# Patient Record
Sex: Male | Born: 1935 | Race: White | Hispanic: No | Marital: Married | Smoking: Former smoker
Health system: Southern US, Community
[De-identification: ages and names within clinical notes are randomized; demographics above are authoritative.]

## PROBLEM LIST (undated history)

## (undated) DIAGNOSIS — N289 Disorder of kidney and ureter, unspecified: Secondary | ICD-10-CM

## (undated) DIAGNOSIS — M159 Polyosteoarthritis, unspecified: Secondary | ICD-10-CM

## (undated) DIAGNOSIS — K5730 Diverticulosis of large intestine without perforation or abscess without bleeding: Secondary | ICD-10-CM

## (undated) DIAGNOSIS — I219 Acute myocardial infarction, unspecified: Secondary | ICD-10-CM

## (undated) DIAGNOSIS — G4733 Obstructive sleep apnea (adult) (pediatric): Secondary | ICD-10-CM

## (undated) DIAGNOSIS — Z85828 Personal history of other malignant neoplasm of skin: Secondary | ICD-10-CM

## (undated) DIAGNOSIS — E785 Hyperlipidemia, unspecified: Secondary | ICD-10-CM

## (undated) DIAGNOSIS — Z8739 Personal history of other diseases of the musculoskeletal system and connective tissue: Secondary | ICD-10-CM

## (undated) DIAGNOSIS — Z9989 Dependence on other enabling machines and devices: Secondary | ICD-10-CM

## (undated) DIAGNOSIS — T8859XA Other complications of anesthesia, initial encounter: Secondary | ICD-10-CM

## (undated) DIAGNOSIS — E1140 Type 2 diabetes mellitus with diabetic neuropathy, unspecified: Secondary | ICD-10-CM

## (undated) DIAGNOSIS — M4626 Osteomyelitis of vertebra, lumbar region: Secondary | ICD-10-CM

## (undated) DIAGNOSIS — Z9889 Other specified postprocedural states: Secondary | ICD-10-CM

## (undated) DIAGNOSIS — I10 Essential (primary) hypertension: Secondary | ICD-10-CM

## (undated) DIAGNOSIS — I2510 Atherosclerotic heart disease of native coronary artery without angina pectoris: Secondary | ICD-10-CM

## (undated) DIAGNOSIS — I340 Nonrheumatic mitral (valve) insufficiency: Secondary | ICD-10-CM

## (undated) DIAGNOSIS — R269 Unspecified abnormalities of gait and mobility: Secondary | ICD-10-CM

## (undated) DIAGNOSIS — I878 Other specified disorders of veins: Secondary | ICD-10-CM

## (undated) DIAGNOSIS — M549 Dorsalgia, unspecified: Secondary | ICD-10-CM

## (undated) DIAGNOSIS — T4145XA Adverse effect of unspecified anesthetic, initial encounter: Secondary | ICD-10-CM

## (undated) DIAGNOSIS — C801 Malignant (primary) neoplasm, unspecified: Secondary | ICD-10-CM

## (undated) DIAGNOSIS — E559 Vitamin D deficiency, unspecified: Secondary | ICD-10-CM

## (undated) DIAGNOSIS — J189 Pneumonia, unspecified organism: Secondary | ICD-10-CM

## (undated) DIAGNOSIS — I5030 Unspecified diastolic (congestive) heart failure: Secondary | ICD-10-CM

## (undated) DIAGNOSIS — Z6841 Body Mass Index (BMI) 40.0 and over, adult: Secondary | ICD-10-CM

## (undated) DIAGNOSIS — G8929 Other chronic pain: Secondary | ICD-10-CM

## (undated) DIAGNOSIS — Z955 Presence of coronary angioplasty implant and graft: Secondary | ICD-10-CM

## (undated) DIAGNOSIS — I4891 Unspecified atrial fibrillation: Secondary | ICD-10-CM

## (undated) HISTORY — DX: Polyosteoarthritis, unspecified: M15.9

## (undated) HISTORY — DX: Atherosclerotic heart disease of native coronary artery without angina pectoris: I25.10

## (undated) HISTORY — DX: Obstructive sleep apnea (adult) (pediatric): G47.33

## (undated) HISTORY — DX: Dependence on other enabling machines and devices: Z99.89

## (undated) HISTORY — DX: Other chronic pain: G89.29

## (undated) HISTORY — DX: Diverticulosis of large intestine without perforation or abscess without bleeding: K57.30

## (undated) HISTORY — DX: Nonrheumatic mitral (valve) insufficiency: I34.0

## (undated) HISTORY — DX: Vitamin D deficiency, unspecified: E55.9

## (undated) HISTORY — DX: Unspecified abnormalities of gait and mobility: R26.9

## (undated) HISTORY — DX: Disorder of kidney and ureter, unspecified: N28.9

## (undated) HISTORY — DX: Dorsalgia, unspecified: M54.9

## (undated) HISTORY — DX: Hyperlipidemia, unspecified: E78.5

## (undated) HISTORY — DX: Presence of coronary angioplasty implant and graft: Z95.5

## (undated) HISTORY — DX: Morbid (severe) obesity due to excess calories: E66.01

## (undated) HISTORY — PX: LAMINECTOMY: SHX219

## (undated) HISTORY — DX: Personal history of other malignant neoplasm of skin: Z85.828

## (undated) HISTORY — DX: Body Mass Index (BMI) 40.0 and over, adult: Z684

## (undated) HISTORY — DX: Type 2 diabetes mellitus with diabetic neuropathy, unspecified: E11.40

## (undated) HISTORY — DX: Other specified disorders of veins: I87.8

## (undated) HISTORY — PX: BACK SURGERY: SHX140

## (undated) HISTORY — DX: Personal history of other diseases of the musculoskeletal system and connective tissue: Z87.39

## (undated) HISTORY — DX: Unspecified diastolic (congestive) heart failure: I50.30

## (undated) HISTORY — DX: Other specified postprocedural states: Z98.890

---

## 1995-08-22 HISTORY — PX: ANGIOPLASTY: SHX39

## 1995-09-06 DIAGNOSIS — I219 Acute myocardial infarction, unspecified: Secondary | ICD-10-CM

## 1995-09-06 HISTORY — DX: Acute myocardial infarction, unspecified: I21.9

## 1995-09-09 DIAGNOSIS — Z955 Presence of coronary angioplasty implant and graft: Secondary | ICD-10-CM

## 1995-09-09 HISTORY — PX: CORONARY STENT PLACEMENT: SHX1402

## 1995-09-09 HISTORY — DX: Presence of coronary angioplasty implant and graft: Z95.5

## 2008-11-21 HISTORY — PX: OTHER SURGICAL HISTORY: SHX169

## 2010-10-21 HISTORY — PX: OTHER SURGICAL HISTORY: SHX169

## 2011-07-06 ENCOUNTER — Emergency Department (HOSPITAL_COMMUNITY): Payer: Medicare Other

## 2011-07-06 ENCOUNTER — Other Ambulatory Visit: Payer: Self-pay

## 2011-07-06 ENCOUNTER — Emergency Department (HOSPITAL_COMMUNITY)
Admission: EM | Admit: 2011-07-06 | Discharge: 2011-07-06 | Disposition: A | Discharge status: Home / Self Care | Payer: Medicare Other | Attending: Emergency Medicine | Admitting: Emergency Medicine

## 2011-07-06 ENCOUNTER — Encounter: Payer: Self-pay | Admitting: *Deleted

## 2011-07-06 DIAGNOSIS — W19XXXA Unspecified fall, initial encounter: Secondary | ICD-10-CM | POA: Insufficient documentation

## 2011-07-06 DIAGNOSIS — Z9861 Coronary angioplasty status: Secondary | ICD-10-CM | POA: Insufficient documentation

## 2011-07-06 DIAGNOSIS — I10 Essential (primary) hypertension: Secondary | ICD-10-CM | POA: Insufficient documentation

## 2011-07-06 DIAGNOSIS — Z87891 Personal history of nicotine dependence: Secondary | ICD-10-CM | POA: Insufficient documentation

## 2011-07-06 DIAGNOSIS — S42402A Unspecified fracture of lower end of left humerus, initial encounter for closed fracture: Secondary | ICD-10-CM

## 2011-07-06 DIAGNOSIS — I252 Old myocardial infarction: Secondary | ICD-10-CM | POA: Insufficient documentation

## 2011-07-06 DIAGNOSIS — I4891 Unspecified atrial fibrillation: Secondary | ICD-10-CM | POA: Insufficient documentation

## 2011-07-06 DIAGNOSIS — E119 Type 2 diabetes mellitus without complications: Secondary | ICD-10-CM | POA: Insufficient documentation

## 2011-07-06 DIAGNOSIS — S42413A Displaced simple supracondylar fracture without intercondylar fracture of unspecified humerus, initial encounter for closed fracture: Secondary | ICD-10-CM | POA: Insufficient documentation

## 2011-07-06 HISTORY — DX: Osteomyelitis of vertebra, lumbar region: M46.26

## 2011-07-06 HISTORY — DX: Unspecified atrial fibrillation: I48.91

## 2011-07-06 HISTORY — DX: Essential (primary) hypertension: I10

## 2011-07-06 HISTORY — DX: Acute myocardial infarction, unspecified: I21.9

## 2011-07-06 MED ORDER — HYDROCODONE-ACETAMINOPHEN 5-325 MG PO TABS: 1.0000 | ORAL_TABLET | ORAL | Status: AC | PRN

## 2011-07-06 MED ORDER — HYDROMORPHONE HCL 1 MG/ML IJ SOLN
1.0000 mg | Freq: Once | INTRAMUSCULAR | Status: AC
  Administered 2011-07-06: 1 mg via INTRAMUSCULAR
  Filled 2011-07-06: qty 1

## 2011-07-06 NOTE — ED Notes (Signed)
Pt c/o pain to left elbow. Pts family states pt fell this past Wednesday and could not get out of the floor. Pt did not want to go to the hospital at that time. Pt was seen by his pcp the next day and x-rayed on his left elbow. Pt was sent home with tylenol and ice. Pt began to hurt worse today and would not move his elbow.

## 2011-07-06 NOTE — ED Notes (Signed)
MD at bedside. 

## 2011-07-06 NOTE — ED Provider Notes (Signed)
History     CSN: [id number] Arrival date & time: 07/06/2011  6:21 PM Scribed for [name], MD, the patient was seen in room APA04/APA04. This chart was scribed by Katha Cabal. This patient's care was started at 7:03PM.    Chief Complaint  Patient presents with  . Elbow Pain     HPI Charles Chambers is a 75 y.o. male who presents to the Emergency Department complaining of gradual worsening of persistent left elbow pain onset 3 days ago with associated SOB.  Patient states that he landed on his hands and not his elbow.   Patient was seen by PCP the next day and the XR did not indicate a fx.  Patient was told to ice elbow and to take Tylenol and patient complied.  History is supplemented by the patient's daughter.    PAST MEDICAL HISTORY:  Past Medical History  Diagnosis Date  . Diabetes mellitus   . Hypertension   . Atrial fibrillation   . MI (myocardial infarction)   . Osteomyelitis of vertebra of lumbar region     PAST SURGICAL HISTORY:  Past Surgical History  Procedure Date  . Coronary stent placement   . Angioplasty   . Back surgery     MEDICATIONS:  Previous Medications   No medications on file     ALLERGIES:  Allergies as of 07/06/2011 - Review Complete 07/06/2011  Allergen Reaction Noted  . Aleve (all day pain)  07/06/2011  . Baclofen Other (See Comments) 07/06/2011  . Neurontin (gabapentin) Other (See Comments) 07/06/2011  . Other Other (See Comments) 07/06/2011     FAMILY HISTORY:  History reviewed. No pertinent family history.   SOCIAL HISTORY: History   Social History  . Marital Status: Married    Spouse Name: N/A    Number of Children: N/A  . Years of Education: N/A   Social History Main Topics  . Smoking status: Former Games developer  . Smokeless tobacco: None  . Alcohol Use: No  . Drug Use: No  . Sexually Active:    Other Topics Concern  . None   Social History Narrative  . None      Review of Systems 10 Systems reviewed and are  negative for acute change except as noted in the HPI.   Physical Exam    BP 132/105  Pulse 77  Resp 40  Ht 5\' 9"  (1.753 m)  Wt 307 lb (139.254 kg)  BMI 45.34 kg/m2  SpO2 95%  Physical Exam  Nursing note and vitals reviewed. Constitutional: He is oriented to person, place, and time. He appears well-developed. No distress.       Appearance consistent with age of record  HENT:  Head: Normocephalic and atraumatic.  Right Ear: External ear normal.  Left Ear: External ear normal.  Nose: Nose normal.  Mouth/Throat: Oropharynx is clear and moist.  Eyes: Conjunctivae are normal.  Neck: Neck supple.  Pulmonary/Chest: He has no rhonchi.  Abdominal: Soft. There is no tenderness.  Musculoskeletal: Normal range of motion.       Tendentious diffuse tenderness, pain with flexion and extension of left elbow.    Neurological: He is alert and oriented to person, place, and time. No sensory deficit.  Skin: No rash noted.       Color normal  Psychiatric: He has a normal mood and affect. His behavior is normal.    ED Course  Procedures  OTHER DATA REVIEWED: Nursing notes, vital signs, and past medical records reviewed.   DIAGNOSTIC  STUDIES: Oxygen Saturation is 95% on room air, normal by my interpretation.     LABS / RADIOLOGY:  No results found for this or any previous visit.    Dg Elbow Complete Left  07/06/2011  *RADIOLOGY REPORT*  Clinical Data: 75 year old male status post fall with pain.  LEFT ELBOW - COMPLETE 3+ VIEW  Comparison: None.  Findings: Oblique fracture through the left supracondylar humerus. Associated hemarthrosis.  Distal fragment demonstrates slight anterior displacement and angulation.  There is slight ulnar displacement.  Radial head appears remain intact.  Joint spaces preserved.  IMPRESSION: Oblique left supracondylar humerus fracture with hemarthrosis, mild anterior and ulnar displacement.  Original Report Authenticated By: Harley Hallmark, M.D.      ED  COURSE / COORDINATION OF CARE: 7:06 PM  CT Left Elbow without contrast of left elbow to look for fx. Will order EKG.    Orders Placed This Encounter  Procedures  . DG Elbow Complete Left  . ED EKG    MDM: Plain films show relatively nondisplaced left supracondylar humeral fracture. I discussed x-rays with family and hand surgeon in [place]. Surgeon will followup on Tuesday. Posterior splint, pain medicine     MEDICATIONS GIVEN IN THE E.D. Scheduled Meds:    .  HYDROmorphone (DILAUDID) injection  1 mg Intramuscular Once   Continuous Infusions:     DISCHARGE MEDICATIONS: New Prescriptions   No medications on file   I personally performed the services described in this documentation, which was scribed in my presence. The recorded information has been reviewed and considered. [name], MD       [name], MD 07/06/11 [phone]

## 2011-07-06 NOTE — ED Notes (Addendum)
Report received from Harvel Ricks, RN; pt transported to [facility]

## 2014-10-21 HISTORY — PX: OTHER SURGICAL HISTORY: SHX169

## 2016-06-21 HISTORY — PX: PAROTIDECTOMY: SUR1003

## 2016-07-11 DIAGNOSIS — C801 Malignant (primary) neoplasm, unspecified: Secondary | ICD-10-CM

## 2016-07-11 HISTORY — DX: Malignant (primary) neoplasm, unspecified: C80.1

## 2017-09-19 ENCOUNTER — Encounter: Payer: Self-pay | Admitting: Internal Medicine

## 2017-09-29 ENCOUNTER — Institutional Professional Consult (permissible substitution): Payer: Medicare Other | Admitting: Internal Medicine

## 2017-10-03 ENCOUNTER — Encounter: Payer: Self-pay | Admitting: Internal Medicine

## 2017-10-03 ENCOUNTER — Ambulatory Visit (INDEPENDENT_AMBULATORY_CARE_PROVIDER_SITE_OTHER): Payer: Medicare Other | Admitting: Internal Medicine

## 2017-10-03 VITALS — BP 102/74 | HR 88 | Ht 69.0 in | Wt 280.0 lb

## 2017-10-03 DIAGNOSIS — I2589 Other forms of chronic ischemic heart disease: Secondary | ICD-10-CM

## 2017-10-03 DIAGNOSIS — I255 Ischemic cardiomyopathy: Secondary | ICD-10-CM | POA: Diagnosis not present

## 2017-10-03 DIAGNOSIS — I130 Hypertensive heart and chronic kidney disease with heart failure and stage 1 through stage 4 chronic kidney disease, or unspecified chronic kidney disease: Secondary | ICD-10-CM

## 2017-10-03 DIAGNOSIS — I482 Chronic atrial fibrillation: Secondary | ICD-10-CM | POA: Diagnosis not present

## 2017-10-03 DIAGNOSIS — G4733 Obstructive sleep apnea (adult) (pediatric): Secondary | ICD-10-CM

## 2017-10-03 DIAGNOSIS — I4821 Permanent atrial fibrillation: Secondary | ICD-10-CM

## 2017-10-03 NOTE — Patient Instructions (Addendum)
Medication Instructions:  Your physician recommends that you continue on your current medications as directed. Please refer to the Current Medication list given to you today.   Labwork: None ordered   Testing/Procedures: None ordered   Follow-Up: Your physician wants you to follow-up in: 3 months with Dr. Rayann Heman in [place]   Any Other Special Instructions Will Be Listed Below (If Applicable).  Weigh yourself daily  Low-Sodium Eating Plan Sodium, which is an element that makes up salt, helps you maintain a healthy balance of fluids in your body. Too much sodium can increase your blood pressure and cause fluid and waste to be held in your body. Your health care provider or dietitian may recommend following this plan if you have high blood pressure (hypertension), kidney disease, liver disease, or heart failure. Eating less sodium can help lower your blood pressure, reduce swelling, and protect your heart, liver, and kidneys. What are tips for following this plan? General guidelines  Most people on this plan should limit their sodium intake to 2,000 mg (milligrams) of sodium each day. Reading food labels  The Nutrition Facts label lists the amount of sodium in one serving of the food. If you eat more than one serving, you must multiply the listed amount of sodium by the number of servings.  Choose foods with less than 140 mg of sodium per serving.  Avoid foods with 300 mg of sodium or more per serving. Shopping  Look for lower-sodium products, often labeled as "low-sodium" or "no salt added."  Always check the sodium content even if foods are labeled as "unsalted" or "no salt added".  Buy fresh foods. ? Avoid canned foods and premade or frozen meals. ? Avoid canned, cured, or processed meats  Buy breads that have less than 80 mg of sodium per slice. Cooking  Eat more home-cooked food and less restaurant, buffet, and fast food.  Avoid adding salt when cooking. Use salt-free  seasonings or herbs instead of table salt or sea salt. Check with your health care provider or pharmacist before using salt substitutes.  Cook with plant-based oils, such as canola, sunflower, or olive oil. Meal planning  When eating at a restaurant, ask that your food be prepared with less salt or no salt, if possible.  Avoid foods that contain MSG (monosodium glutamate). MSG is sometimes added to [place] food, bouillon, and some canned foods. What foods are recommended? The items listed may not be a complete list. Talk with your dietitian about what dietary choices are best for you. Grains Low-sodium cereals, including oats, puffed wheat and rice, and shredded wheat. Low-sodium crackers. Unsalted rice. Unsalted pasta. Low-sodium bread. Whole-grain breads and whole-grain pasta. Vegetables Fresh or frozen vegetables. "No salt added" canned vegetables. "No salt added" tomato sauce and paste. Low-sodium or reduced-sodium tomato and vegetable juice. Fruits Fresh, frozen, or canned fruit. Fruit juice. Meats and other protein foods Fresh or frozen (no salt added) meat, poultry, seafood, and fish. Low-sodium canned tuna and salmon. Unsalted nuts. Dried peas, beans, and lentils without added salt. Unsalted canned beans. Eggs. Unsalted nut butters. Dairy Milk. Soy milk. Cheese that is naturally low in sodium, such as ricotta cheese, fresh mozzarella, or Swiss cheese Low-sodium or reduced-sodium cheese. Cream cheese. Yogurt. Fats and oils Unsalted butter. Unsalted margarine with no trans fat. Vegetable oils such as canola or olive oils. Seasonings and other foods Fresh and dried herbs and spices. Salt-free seasonings. Low-sodium mustard and ketchup. Sodium-free salad dressing. Sodium-free light mayonnaise. Fresh or refrigerated horseradish.  Lemon juice. Vinegar. Homemade, reduced-sodium, or low-sodium soups. Unsalted popcorn and pretzels. Low-salt or salt-free chips. What foods are not  recommended? The items listed may not be a complete list. Talk with your dietitian about what dietary choices are best for you. Grains Instant hot cereals. Bread stuffing, pancake, and biscuit mixes. Croutons. Seasoned rice or pasta mixes. Noodle soup cups. Boxed or frozen macaroni and cheese. Regular salted crackers. Self-rising flour. Vegetables Sauerkraut, pickled vegetables, and relishes. Olives. [place] fries. Onion rings. Regular canned vegetables (not low-sodium or reduced-sodium). Regular canned tomato sauce and paste (not low-sodium or reduced-sodium). Regular tomato and vegetable juice (not low-sodium or reduced-sodium). Frozen vegetables in sauces. Meats and other protein foods Meat or fish that is salted, canned, smoked, spiced, or pickled. Bacon, ham, sausage, hotdogs, corned beef, chipped beef, packaged lunch meats, salt pork, jerky, pickled herring, anchovies, regular canned tuna, sardines, salted nuts. Dairy Processed cheese and cheese spreads. Cheese curds. Blue cheese. Feta cheese. String cheese. Regular cottage cheese. Buttermilk. Canned milk. Fats and oils Salted butter. Regular margarine. Ghee. Bacon fat. Seasonings and other foods Onion salt, garlic salt, seasoned salt, table salt, and sea salt. Canned and packaged gravies. Worcestershire sauce. Tartar sauce. Barbecue sauce. Teriyaki sauce. Soy sauce, including reduced-sodium. Steak sauce. Fish sauce. Oyster sauce. Cocktail sauce. Horseradish that you find on the shelf. Regular ketchup and mustard. Meat flavorings and tenderizers. Bouillon cubes. Hot sauce and Tabasco sauce. Premade or packaged marinades. Premade or packaged taco seasonings. Relishes. Regular salad dressings. Salsa. Potato and tortilla chips. Corn chips and puffs. Salted popcorn and pretzels. Canned or dried soups. Pizza. Frozen entrees and pot pies. Summary  Eating less sodium can help lower your blood pressure, reduce swelling, and protect your heart, liver,  and kidneys.  Most people on this plan should limit their sodium intake to 1,500-2,000 mg (milligrams) of sodium each day.  Canned, boxed, and frozen foods are high in sodium. Restaurant foods, fast foods, and pizza are also very high in sodium. You also get sodium by adding salt to food.  Try to cook at home, eat more fresh fruits and vegetables, and eat less fast food, canned, processed, or prepared foods. This information is not intended to replace advice given to you by your health care provider. Make sure you discuss any questions you have with your health care provider. Document Released: 03/29/2002 Document Revised: 09/30/2016 Document Reviewed: 09/30/2016 Elsevier Interactive Patient Education  2017 [employer].      If you need a refill on your cardiac medications before your next appointment, please call your pharmacy.

## 2017-10-03 NOTE — Progress Notes (Signed)
Electrophysiology Office Note   Date:  10/03/2017   ID:  Charles Chambers, DOB 10/02/1936, MRN [id number]  PCP:  Earney Mallet, MD  Cardiologist:  Dr Darral Dash Primary Electrophysiologist: Thompson Grayer, MD    Chief Complaint  Patient presents with  . Atrial Fibrillation     History of Present Illness: Charles Chambers is a 81 y.o. male who presents today for electrophysiology evaluation.   He is chronically ill with multiple comorbidities including obesity, permanent afib, stage IV renal disease, ischemic CM (EF 35-40%), NYHA Class III CHF, and CAD.  He presents with his daughter today to establish cardiology care with [facility].  He seems to be doing well and is on good medical therapy.  Apparently, he was quite ill earlier this year but has since improved.  There were discussions about heart catheterization which the patient's daughter did not agree with (due to renal failure).  He was treated supportively and has done well. He has permanent afib and has had a h/o afib since 2007.  He is on coumadin due to costs of DOAC therapy.  He has a h/o discitis and is not very active.  He has chronic debility.  His edema has recently improved but is chronic.  He also has sob with minimal activity chronically.  Today, he denies symptoms of palpitations, chest pain, claudication, dizziness, presyncope, syncope, bleeding, or neurologic sequela. The patient is tolerating medications without difficulties and is otherwise without complaint today.    Past Medical History:  Diagnosis Date  . Abnormal gait   . Atrial fibrillation ([place])   . CAD (coronary artery disease)   . Chronic back pain   . Congestive heart failure with left ventricular diastolic dysfunction, NYHA class 2 ([place])   . Diabetes mellitus    INSULIN DEPENDENT, CONTROLLED  . Diabetic neuropathy ([place])   . Diverticulosis of colon   . Hx of colonoscopy    10 YEARS AGO  . Hx of nonmelanoma skin cancer   . Hx of osteomyelitis   .  Hyperlipidemia   . Hypertension    BENIGN ESSENTIAL  . Lower extremity venous stasis   . MI (myocardial infarction) ([place])   . Mild renal insufficiency   . Moderate mitral regurgitation   . Morbid obesity with BMI of 40.0-44.9, adult ([place])   . Obstructive sleep apnea on CPAP   . Osteoarthritis, multiple sites   . Osteomyelitis of vertebra of lumbar region ([place])   . Status post coronary artery stent placement   . Vitamin D deficiency    Past Surgical History:  Procedure Laterality Date  . ANGIOPLASTY  08/1995   STENT PLACEMENT  . ARM/ELBOW SURGERY  2016  . BACK SURGERY    . CORONARY STENT PLACEMENT    . LAMINECTOMY     OPEN BIOPSY OF L4-S1  . PAROTIDECTOMY Right 06/2016  . POSTERIOR FUSION FOR OSTEOMYLITIS  11/2008  . REMOVAL OF CYST FROM EYELID  10/2010     Current Outpatient Medications  Medication Sig Dispense Refill  . aspirin EC 81 MG tablet Take 81 mg by mouth daily.      Marland Kitchen atorvastatin (LIPITOR) 20 MG tablet Take 20 mg by mouth daily.      . carvedilol (COREG) 25 MG tablet Take 25 mg by mouth 2 (two) times daily with a meal.    . Cholecalciferol (VITAMIN D3) 3000 UNITS TABS Take 1 tablet by mouth daily.      . furosemide (LASIX) 20 MG tablet Take 20 mg  by mouth daily as needed (SWELLING).     . hydrochlorothiazide (HYDRODIURIL) 25 MG tablet Take 25 mg by mouth daily.      . insulin lispro protamine-insulin lispro (HUMALOG 75/25) (75-25) 100 UNIT/ML SUSP Inject into the skin as directed. Patient takes 31- units every morning and 17 units every evening    . lisinopril (PRINIVIL,ZESTRIL) 20 MG tablet Take 20 mg by mouth 2 (two) times daily.     Marland Kitchen warfarin (COUMADIN) 5 MG tablet Take 5 mg by mouth daily. Patient takes (5mg ) on Tuesday,Wednesday,thursday,Saturday,Sunday..Patient takes (2.5mg ) on Monday and Friday!!!      No current facility-administered medications for this visit.     Allergies:   Aleve [naproxen sodium]; Baclofen; Neurontin [gabapentin]; and Other    Social History:  The patient  reports that he has quit smoking. he has never used smokeless tobacco. He reports that he does not drink alcohol or use drugs.   Family History:  The patient's  family history includes Heart disease in his father; Heart disease (age of onset: 18) in his mother.    ROS:  Please see the history of present illness.   All other systems are personally reviewed and negative.    PHYSICAL EXAM: VS:  BP 102/74   Pulse 88   Ht 5\' 9"  (1.753 m)   Wt 280 lb (127 kg)   SpO2 96%   BMI 41.35 kg/m  , BMI Body mass index is 41.35 kg/m. GEN: elderly and chronically ill , in no acute distress  HEENT: normal  Neck: no JVD, carotid bruits, or masses Cardiac: iRRR; 2/6SEM LUSB (early peaking),  + 2 dependant edema with venous stasis changes Respiratory:  clear to auscultation bilaterally, normal work of breathing GI: soft, nontender, nondistended, + BS MS: diffuse muscle atrophy  Skin: warm and dry , multiple lesions on sun exposed areas Neuro:  Strength and sensation are intact Psych: euthymic mood, full affect    Wt Readings from Last 3 Encounters:  10/03/17 280 lb (127 kg)  07/06/11 (!) 307 lb (139.3 kg)      Other studies personally reviewed: Additional studies/ records that were reviewed today include: 26 pages of records from [facility] in [place] of the above records today demonstrates:   Echo 05/15/17- EF 35-40%, LVEDD 23mm, LA 55 cm, AK apex, distal septum, distal anterior wall, and basal inferior wall.  Mild RV dilation with normal RAP, biatrial enlargement, mild MR, trace AI, trace TR  ecg 05/26/17- afib, V rate 67 bpm, RBBB, inferior infarct   ASSESSMENT AND PLAN:  1.  Ischemic CM (EF 35-40%), NYHA Class III CHF optivolemic today Doing reasonably well given comoribidites Could consider entresto in the future or perhaps spironolactone. 2 gram sodium diet and daily weights encouraged Not a candidate for advanced  therapies given advanced age and comorbidities Family prefers a conservative approach  2. CAD No ischemic symptoms Family is clear in their decision to avoid cath in the future Should probably stop ASA as he is on coumadin on return   3. Permanent afib Rate controlled On coumadin (due to costs of DOAC therapy)  4. Stage IV renal disease Stable currently  5. OSA Does not tolerate CPAP but uses 2 L of O2 at night  6. 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   The patient does not have concerns regarding his medicines.  The following changes were made today:  none   Signed, Thompson Grayer, MD  10/03/2017 5:25 PM     [address] [place]  [id number] [phone] (office) [phone] (fax)

## 2017-12-19 ENCOUNTER — Ambulatory Visit: Payer: Medicare Other | Admitting: Internal Medicine

## 2018-01-09 ENCOUNTER — Ambulatory Visit: Payer: Medicare Other | Admitting: Internal Medicine

## 2018-02-20 ENCOUNTER — Ambulatory Visit (INDEPENDENT_AMBULATORY_CARE_PROVIDER_SITE_OTHER): Payer: Medicare Other | Admitting: Internal Medicine

## 2018-02-20 ENCOUNTER — Encounter: Payer: Self-pay | Admitting: Internal Medicine

## 2018-02-20 VITALS — BP 130/80 | HR 77 | Ht 69.0 in | Wt 281.8 lb

## 2018-02-20 DIAGNOSIS — I482 Chronic atrial fibrillation: Secondary | ICD-10-CM

## 2018-02-20 DIAGNOSIS — I130 Hypertensive heart and chronic kidney disease with heart failure and stage 1 through stage 4 chronic kidney disease, or unspecified chronic kidney disease: Secondary | ICD-10-CM | POA: Diagnosis not present

## 2018-02-20 DIAGNOSIS — G4733 Obstructive sleep apnea (adult) (pediatric): Secondary | ICD-10-CM

## 2018-02-20 DIAGNOSIS — I519 Heart disease, unspecified: Secondary | ICD-10-CM | POA: Diagnosis not present

## 2018-02-20 DIAGNOSIS — I255 Ischemic cardiomyopathy: Secondary | ICD-10-CM

## 2018-02-20 DIAGNOSIS — I4821 Permanent atrial fibrillation: Secondary | ICD-10-CM | POA: Insufficient documentation

## 2018-02-20 MED ORDER — SACUBITRIL-VALSARTAN 24-26 MG PO TABS: 1.0000 | ORAL_TABLET | Freq: Two times a day (BID) | ORAL | 3 refills | Status: DC

## 2018-02-20 NOTE — Patient Instructions (Addendum)
Medication Instructions:   Your physician has recommended you make the following change in your medication:   Stop lisinopril  Start entresto 24/26 mg by mouth twice daily 36 hours after stopping lisinopril.  Continue all other medications the same.  Labwork:  NONE  Testing/Procedures:  NONE  Follow-Up: Your physician recommends that you schedule a follow-up appointment in: 6 weeks with Dr. Rayann Heman. Please schedule this appointment today before leaving the office.   Any Other Special Instructions Will Be Listed Below (If Applicable). Your physician recommends that you weigh, daily, at the same time every day, and in the same amount of clothing. Please record your daily weights on the handout provided and bring it to your next appointment. Your doctor recommends that you limit your sodium intake to no more than 2000 mg per day. Please use the information provided for you today as a guide.    If you need a refill on your cardiac medications before your next appointment, please call your pharmacy. Low-Sodium Eating Plan Sodium, which is an element that makes up salt, helps you maintain a healthy balance of fluids in your body. Too much sodium can increase your blood pressure and cause fluid and waste to be held in your body. Your health care provider or dietitian may recommend following this plan if you have high blood pressure (hypertension), kidney disease, liver disease, or heart failure. Eating less sodium can help lower your blood pressure, reduce swelling, and protect your heart, liver, and kidneys. What are tips for following this plan? General guidelines  Most people on this plan should limit their sodium intake to 1,500-2,000 mg (milligrams) of sodium each day. Reading food labels  The Nutrition Facts label lists the amount of sodium in one serving of the food. If you eat more than one serving, you must multiply the listed amount of sodium by the number of  servings.  Choose foods with less than 140 mg of sodium per serving.  Avoid foods with 300 mg of sodium or more per serving. Shopping  Look for lower-sodium products, often labeled as "low-sodium" or "no salt added."  Always check the sodium content even if foods are labeled as "unsalted" or "no salt added".  Buy fresh foods. ? Avoid canned foods and premade or frozen meals. ? Avoid canned, cured, or processed meats  Buy breads that have less than 80 mg of sodium per slice. Cooking  Eat more home-cooked food and less restaurant, buffet, and fast food.  Avoid adding salt when cooking. Use salt-free seasonings or herbs instead of table salt or sea salt. Check with your health care provider or pharmacist before using salt substitutes.  Cook with plant-based oils, such as canola, sunflower, or olive oil. Meal planning  When eating at a restaurant, ask that your food be prepared with less salt or no salt, if possible.  Avoid foods that contain MSG (monosodium glutamate). MSG is sometimes added to [place] food, bouillon, and some canned foods. What foods are recommended? The items listed may not be a complete list. Talk with your dietitian about what dietary choices are best for you. Grains Low-sodium cereals, including oats, puffed wheat and rice, and shredded wheat. Low-sodium crackers. Unsalted rice. Unsalted pasta. Low-sodium bread. Whole-grain breads and whole-grain pasta. Vegetables Fresh or frozen vegetables. "No salt added" canned vegetables. "No salt added" tomato sauce and paste. Low-sodium or reduced-sodium tomato and vegetable juice. Fruits Fresh, frozen, or canned fruit. Fruit juice. Meats and other protein foods Fresh or frozen (no  salt added) meat, poultry, seafood, and fish. Low-sodium canned tuna and salmon. Unsalted nuts. Dried peas, beans, and lentils without added salt. Unsalted canned beans. Eggs. Unsalted nut butters. Dairy Milk. Soy milk. Cheese that is  naturally low in sodium, such as ricotta cheese, fresh mozzarella, or Swiss cheese Low-sodium or reduced-sodium cheese. Cream cheese. Yogurt. Fats and oils Unsalted butter. Unsalted margarine with no trans fat. Vegetable oils such as canola or olive oils. Seasonings and other foods Fresh and dried herbs and spices. Salt-free seasonings. Low-sodium mustard and ketchup. Sodium-free salad dressing. Sodium-free light mayonnaise. Fresh or refrigerated horseradish. Lemon juice. Vinegar. Homemade, reduced-sodium, or low-sodium soups. Unsalted popcorn and pretzels. Low-salt or salt-free chips. What foods are not recommended? The items listed may not be a complete list. Talk with your dietitian about what dietary choices are best for you. Grains Instant hot cereals. Bread stuffing, pancake, and biscuit mixes. Croutons. Seasoned rice or pasta mixes. Noodle soup cups. Boxed or frozen macaroni and cheese. Regular salted crackers. Self-rising flour. Vegetables Sauerkraut, pickled vegetables, and relishes. Olives. [place] fries. Onion rings. Regular canned vegetables (not low-sodium or reduced-sodium). Regular canned tomato sauce and paste (not low-sodium or reduced-sodium). Regular tomato and vegetable juice (not low-sodium or reduced-sodium). Frozen vegetables in sauces. Meats and other protein foods Meat or fish that is salted, canned, smoked, spiced, or pickled. Bacon, ham, sausage, hotdogs, corned beef, chipped beef, packaged lunch meats, salt pork, jerky, pickled herring, anchovies, regular canned tuna, sardines, salted nuts. Dairy Processed cheese and cheese spreads. Cheese curds. Blue cheese. Feta cheese. String cheese. Regular cottage cheese. Buttermilk. Canned milk. Fats and oils Salted butter. Regular margarine. Ghee. Bacon fat. Seasonings and other foods Onion salt, garlic salt, seasoned salt, table salt, and sea salt. Canned and packaged gravies. Worcestershire sauce. Tartar sauce. Barbecue sauce.  Teriyaki sauce. Soy sauce, including reduced-sodium. Steak sauce. Fish sauce. Oyster sauce. Cocktail sauce. Horseradish that you find on the shelf. Regular ketchup and mustard. Meat flavorings and tenderizers. Bouillon cubes. Hot sauce and Tabasco sauce. Premade or packaged marinades. Premade or packaged taco seasonings. Relishes. Regular salad dressings. Salsa. Potato and tortilla chips. Corn chips and puffs. Salted popcorn and pretzels. Canned or dried soups. Pizza. Frozen entrees and pot pies. Summary  Eating less sodium can help lower your blood pressure, reduce swelling, and protect your heart, liver, and kidneys.  Most people on this plan should limit their sodium intake to 1,500-2,000 mg (milligrams) of sodium each day.  Canned, boxed, and frozen foods are high in sodium. Restaurant foods, fast foods, and pizza are also very high in sodium. You also get sodium by adding salt to food.  Try to cook at home, eat more fresh fruits and vegetables, and eat less fast food, canned, processed, or prepared foods. This information is not intended to replace advice given to you by your health care provider. Make sure you discuss any questions you have with your health care provider. Document Released: 03/29/2002 Document Revised: 09/30/2016 Document Reviewed: 09/30/2016 Elsevier Interactive Patient Education  [employer].

## 2018-02-20 NOTE — Progress Notes (Signed)
PCP: Earney Mallet, MD Primary Cardiologist:  Previously Dr Darral Dash Primary EP: Dr Rayann Heman  Charles Chambers is a 82 y.o. male who presents today for routine electrophysiology followup.  Since last being seen in our clinic, the patient reports doing reasonably well.  He is chronically ill and fragile.  This past week, SOB is a little worse and weights have been up.  Edema is stable. Today, he denies symptoms of palpitations, chest pain,  dizziness, presyncope, syncope.  The patient is otherwise without complaint today.   Past Medical History:  Diagnosis Date  . Abnormal gait   . Atrial fibrillation ([place])   . CAD (coronary artery disease)   . Chronic back pain   . Congestive heart failure with left ventricular diastolic dysfunction, NYHA class 2 ([place])   . Diabetes mellitus    INSULIN DEPENDENT, CONTROLLED  . Diabetic neuropathy ([place])   . Diverticulosis of colon   . Hx of colonoscopy    10 YEARS AGO  . Hx of nonmelanoma skin cancer   . Hx of osteomyelitis   . Hyperlipidemia   . Hypertension    BENIGN ESSENTIAL  . Lower extremity venous stasis   . MI (myocardial infarction) ([place])   . Mild renal insufficiency   . Moderate mitral regurgitation   . Morbid obesity with BMI of 40.0-44.9, adult ([place])   . Obstructive sleep apnea on CPAP   . Osteoarthritis, multiple sites   . Osteomyelitis of vertebra of lumbar region ([place])   . Status post coronary artery stent placement   . Vitamin D deficiency    Past Surgical History:  Procedure Laterality Date  . ANGIOPLASTY  08/1995   STENT PLACEMENT  . ARM/ELBOW SURGERY  2016  . BACK SURGERY    . CORONARY STENT PLACEMENT    . LAMINECTOMY     OPEN BIOPSY OF L4-S1  . PAROTIDECTOMY Right 06/2016  . POSTERIOR FUSION FOR OSTEOMYLITIS  11/2008  . REMOVAL OF CYST FROM EYELID  10/2010    ROS- all systems are reviewed and negative except as per HPI above  Current Outpatient Medications  Medication Sig Dispense Refill  . aspirin EC 81 MG  tablet Take 81 mg by mouth daily.      Marland Kitchen atorvastatin (LIPITOR) 20 MG tablet Take 20 mg by mouth daily.      . carvedilol (COREG) 25 MG tablet Take 25 mg by mouth 2 (two) times daily with a meal.    . Cholecalciferol (VITAMIN D3) 3000 UNITS TABS Take 1 tablet by mouth daily.      . furosemide (LASIX) 20 MG tablet Take 20 mg by mouth daily as needed (SWELLING).     . hydrochlorothiazide (HYDRODIURIL) 25 MG tablet Take 25 mg by mouth daily.      . insulin lispro protamine-insulin lispro (HUMALOG 75/25) (75-25) 100 UNIT/ML SUSP Inject into the skin as directed. Patient takes 31- units every morning and 17 units every evening    . lisinopril (PRINIVIL,ZESTRIL) 20 MG tablet Take 20 mg by mouth 2 (two) times daily.     Marland Kitchen warfarin (COUMADIN) 5 MG tablet Take 5 mg by mouth daily. Patient takes (5mg ) on Tuesday,Wednesday,thursday,Saturday,Sunday..Patient takes (2.5mg ) on Monday and Friday!!!      No current facility-administered medications for this visit.     Physical Exam: Vitals:   02/20/18 1510  BP: 130/80  Pulse: 77  SpO2: 93%  Weight: 281 lb 12.8 oz (127.8 kg)  Height: 5\' 9"  (1.753 m)    GEN- The  patient is obese appearing, elderly and frail, alert and oriented x 3 today.   Head- normocephalic, atraumatic Eyes-  Sclera clear, conjunctiva pink Ears- hearing intact Oropharynx- clear Lungs- Clear to ausculation bilaterally, normal work of breathing Heart- Regular rate and rhythm, no murmurs, rubs or gallops, PMI not laterally displaced GI- soft, NT, ND, + BS Extremities- no clubbing, cyanosis, or edema  ICD interrogation- reviewed in detail today,  See PACEART report  ekg tracing ordered today is personally reviewed and shows afib, V rate 82 bpm, ashemans beats, IVCD  Wt Readings from Last 3 Encounters:  02/20/18 281 lb 12.8 oz (127.8 kg)  10/03/17 280 lb (127 kg)  07/06/11 (!) 307 lb (139.3 kg)    Assessment and Plan:  1.  Chronic systolic dysfunction/ ischemic CM euvolemic  today Stable on an appropriate medical regimen 2 gram sodium restriction Stop lisopril and start entresto after 36 hours at low dose.  Will titrate as bp allows Labs from 12/13/17 are reviewed  2. CAD No ischemic symptoms Family wishes to manage conservatively Consider stopping ASA on return  3. Permanent afib Rate controlled On coumadin  4. OSA Uses O2 at night but does not tolerate CPAP   5. Hypertension Stable No change required today  6. morbid obesity Weight loss advised Body mass index is 41.61 kg/m.  Return to see me in 6 weeks  Patient is quite ill.  At risk for decompensation/ hospitalization.  A high level of decision making was required for this encounter.  [place] Grayer MD, [facility] 02/20/2018 3:41 PM

## 2018-02-23 ENCOUNTER — Other Ambulatory Visit: Payer: Self-pay

## 2018-02-23 MED ORDER — SACUBITRIL-VALSARTAN 24-26 MG PO TABS: 1.0000 | ORAL_TABLET | Freq: Two times a day (BID) | ORAL | 3 refills | Status: DC

## 2018-03-18 ENCOUNTER — Telehealth: Payer: Self-pay | Admitting: Internal Medicine

## 2018-03-18 NOTE — Telephone Encounter (Signed)
   [facility] Pre-operative Risk Assessment    Request for surgical clearance:  1. What type of surgery is being performed? I&D Rt thumb Mucoid Cyst with Interphalangeal Joint Debridement Possible Rotation Flap    2. When is this surgery scheduled? 03/23/18   3. What type of clearance is required (medical clearance vs. Pharmacy clearance to hold med vs. Both)?  Cardiac  4. Are there any medications that need to be held prior to surgery and how long? None listed   5. Practice name and name of physician performing surgery?  [facility]- Dr. Burney Gauze   6. What is your office phone number? [phone]    7.   What is your office fax number? [place], RN  8.   Anesthesia type (None, local, MAC, general) ?  [place]    _________________________________________________________________   (provider comments below)

## 2018-03-19 NOTE — Telephone Encounter (Signed)
   Primary Cardiologist: [place] Grayer, MD  Chart reviewed as part of pre-operative protocol coverage. Left voice mail to call between pre-op hours.   [place], [place] 03/19/2018, 2:13 PM

## 2018-03-20 ENCOUNTER — Other Ambulatory Visit: Payer: Self-pay

## 2018-03-20 ENCOUNTER — Encounter (HOSPITAL_COMMUNITY): Payer: Self-pay

## 2018-03-20 ENCOUNTER — Other Ambulatory Visit: Payer: Self-pay | Admitting: Orthopedic Surgery

## 2018-03-20 NOTE — Telephone Encounter (Signed)
   Primary Cardiologist: [place] Grayer, MD  Chart reviewed as part of pre-operative protocol coverage. Patient was contacted 03/20/2018 in reference to pre-operative risk assessment for pending surgery as outlined below.  Charles Chambers was last seen on 02/20/2018 by Dr Rayann Heman.  Since that day, Charles Chambers has done well.  His weight is down 6 lbs, he denies SOB or CP. He is stable from a cardiac standpoint. PCP checked his VS and his INR yesterday, all were stable w/ INR 1.7.   **He does not have a defibrillator or other device**  Therefore, based on ACC/AHA guidelines, the patient would be at acceptable risk for the planned procedure without further cardiovascular testing.   I will route this recommendation to the requesting party via Epic fax function and remove from pre-op pool.  Please call with questions.  Rosaria Ferries, PA-C 03/20/2018, 2:49 PM

## 2018-03-20 NOTE — Progress Notes (Signed)
PCP - Dr. Macarthur Critchley - also manages patient's Type 2 DM Cardiologist - Dr. Rayann Heman; previously Dr. Darral Dash  Chest x-ray - 09/02/2017 EKG - 02/20/2018 Stress Test -  ECHO - 05/15/2017 Cardiac Cath - patient's daughter states the patient has not had one because patient is high risk and also cannot have the contrast.  Sleep Study - yes but not sure where CPAP - no, patient wears 2L O2 [place] while sleeping/napping  Fasting Blood Sugar -  Daughter is unsure, states her father has not been checking his CBG lately.  She also states that patient just had labs drawn on 03/13/2018 and his A1c was 6.1.  Daughter to bring lab records with her to DOS  Blood Thinner Instructions:  Daughter states that patient was told to stay on Coumadin and not stop for surgery because he is such hight rish Aspirin Instructions:  Patient is to continue ASA  Anesthesia review: yes, patient has a significant cardiac history  Patient denies shortness of breath, fever, cough and chest pain at the time of Pre-op phone call   Patient's daughter verbalized understanding of instructions that were given to them.

## 2018-03-20 NOTE — Progress Notes (Signed)
Anesthesia Chart Review:  Pt is a same day work up   Case:  [id number] Date/Time:  03/23/18 1045   Procedure:  IRRIGATION AND DEBRIDEMENT WITH INTERPHANGEAL JOINT DEBREIDMENT POSSIBLE ROTATION FLAP (Right )   Anesthesia type:  Monitor Anesthesia Care   Pre-op diagnosis:  RIGHT THUMB MUCOID CYST WITH CHRONIC WOUND   Location:  [place] OR ROOM 02 / [place] OR   Surgeon:  Charlotte Crumb, MD      DISCUSSION: - Pt is a 82 year old male with hx CAD (BMS to LAD 1996, ischemic cardiomyopathy (EF 35-40%), permanent afib, HTN, OSA (uses O2 at night)  - Dr. Rayann Heman documents at last office visit 02/20/18 pt is "chronically ill and fragile". Note 10/03/17 documents "family prefers a conservative approach"  - Pt to continue ASA and coumadin perioperatively   PROVIDERS: PCP is Earney Mallet, MD  EP cardiologist is [place] Grayer, MD. Last office visit 02/20/18   LABS: Will be obtained day of surgery    IMAGES:  CXR 09/02/17: Chronic lung changes; no acute findings  EKG 02/20/18: Atrial fibrillation with premature ventricular or aberrantly conducted complexes.  LAD.  Nonspecific AV block.  Inferior infarct, age undetermined.  Anterolateral infarct, age undetermined.   CV:  Echo 05/15/17:  1.  Moderate ischemic cardia myopathy and two-vessel territories (LAD and RCA).  Estimated EF 35-40% limited by lack of contrast and poor endocardial resolution. 2.  Normal RV function with normal RAP. 3.  Structurally and functionally grossly normal cardiac valves.   Past Medical History:  Diagnosis Date  . Abnormal gait   . Atrial fibrillation ([place])   . CAD (coronary artery disease)   . Chronic back pain   . Congestive heart failure with left ventricular diastolic dysfunction, NYHA class 2 ([place])   . Diabetes mellitus    INSULIN DEPENDENT, CONTROLLED  . Diabetic neuropathy ([place])   . Diverticulosis of colon   . Hx of colonoscopy    10 YEARS AGO  . Hx of nonmelanoma skin cancer   . Hx of osteomyelitis   .  Hyperlipidemia   . Hypertension    BENIGN ESSENTIAL  . Lower extremity venous stasis   . MI (myocardial infarction) ([place])   . Mild renal insufficiency   . Moderate mitral regurgitation   . Morbid obesity with BMI of 40.0-44.9, adult ([place])   . Obstructive sleep apnea on CPAP   . Osteoarthritis, multiple sites   . Osteomyelitis of vertebra of lumbar region ([place])   . Status post coronary artery stent placement   . Vitamin D deficiency     Past Surgical History:  Procedure Laterality Date  . ANGIOPLASTY  08/1995   STENT PLACEMENT  . ARM/ELBOW SURGERY  2016  . BACK SURGERY    . CORONARY STENT PLACEMENT    . LAMINECTOMY     OPEN BIOPSY OF L4-S1  . PAROTIDECTOMY Right 06/2016  . POSTERIOR FUSION FOR OSTEOMYLITIS  11/2008  . REMOVAL OF CYST FROM EYELID  10/2010    MEDICATIONS: No current facility-administered medications for this encounter.    Marland Kitchen acetaminophen (TYLENOL) 500 MG tablet  . aspirin EC 81 MG tablet  . atorvastatin (LIPITOR) 20 MG tablet  . carvedilol (COREG) 25 MG tablet  . Cholecalciferol (VITAMIN D3) 3000 UNITS TABS  . furosemide (LASIX) 20 MG tablet  . hydrochlorothiazide (HYDRODIURIL) 25 MG tablet  . insulin lispro protamine-insulin lispro (HUMALOG 75/25) (75-25) 100 UNIT/ML SUSP  . sacubitril-valsartan (ENTRESTO) 24-26 MG  . warfarin (COUMADIN) 5 MG tablet   -  Pt to continue ASA and coumadin perioperatively  If labs acceptable day of surgery, I anticipate pt can proceed with surgery as scheduled.  Willeen Cass, FNP-BC [facility]/Anesthesiology Phone: [phone] 03/20/2018 2:02 PM

## 2018-03-20 NOTE — Telephone Encounter (Signed)
Pt's daughter called Following Up on notes in this patients chart stating he has a Defibrillator.  [facility] Dr Burney Gauze stated that he has a device in his med rec.  Daughter is also following up on the surgical clearance.  She needs to confirm some information and would like a call back please.

## 2018-03-22 NOTE — Anesthesia Preprocedure Evaluation (Addendum)
Anesthesia Evaluation  Patient identified by MRN, date of birth, ID band Patient awake    Reviewed: Allergy & Precautions, NPO status , Patient's Chart, lab work & pertinent test results  Airway Mallampati: II  TM Distance: >3 FB Neck ROM: Full    Dental no notable dental hx. (+) Dental Advisory Given, Poor Dentition, Chipped, Missing,    Pulmonary sleep apnea , former smoker,  On O2 at night   Pulmonary exam normal breath sounds clear to auscultation       Cardiovascular hypertension, + CAD, + Past MI, + Peripheral Vascular Disease and +CHF  Normal cardiovascular exam Rhythm:Regular Rate:Normal  Echo 05/15/17:  1.  Moderate ischemic cardia myopathy and two-vessel territories (LAD and RCA).  Estimated EF 35-40% limited by lack of contrast and poor endocardial resolution     Neuro/Psych negative neurological ROS     GI/Hepatic Neg liver ROS,   Endo/Other  diabetesMorbid obesity  Renal/GU negative Renal ROS     Musculoskeletal   Abdominal (+) + obese,   Peds  Hematology   Anesthesia Other Findings   Reproductive/Obstetrics                            Anesthesia Physical Anesthesia Plan  ASA: IV  Anesthesia Plan: Regional   Post-op Pain Management:  Regional for Post-op pain   Induction:   PONV Risk Score and Plan: Treatment may vary due to age or medical condition  Airway Management Planned: Mask, Natural Airway and Nasal Cannula  Additional Equipment:   Intra-op Plan:   Post-operative Plan:   Informed Consent: I have reviewed the patients History and Physical, chart, labs and discussed the procedure including the risks, benefits and alternatives for the proposed anesthesia with the patient or authorized representative who has indicated his/her understanding and acceptance.     Plan Discussed with: CRNA  Anesthesia Plan Comments:         Anesthesia Quick Evaluation

## 2018-03-23 ENCOUNTER — Encounter (HOSPITAL_COMMUNITY): Payer: Self-pay | Admitting: Anesthesiology

## 2018-03-23 ENCOUNTER — Ambulatory Visit (HOSPITAL_COMMUNITY): Payer: Medicare Other | Admitting: Emergency Medicine

## 2018-03-23 ENCOUNTER — Ambulatory Visit (HOSPITAL_COMMUNITY)
Admission: RE | Admit: 2018-03-23 | Discharge: 2018-03-23 | Disposition: A | Discharge status: Home / Self Care | Payer: Medicare Other | Source: Ambulatory Visit | Attending: Orthopedic Surgery | Admitting: Orthopedic Surgery

## 2018-03-23 ENCOUNTER — Encounter (HOSPITAL_COMMUNITY)
Admission: RE | Disposition: A | Discharge status: Home / Self Care | Payer: Self-pay | Source: Ambulatory Visit | Attending: Orthopedic Surgery

## 2018-03-23 DIAGNOSIS — E1140 Type 2 diabetes mellitus with diabetic neuropathy, unspecified: Secondary | ICD-10-CM | POA: Diagnosis not present

## 2018-03-23 DIAGNOSIS — I2510 Atherosclerotic heart disease of native coronary artery without angina pectoris: Secondary | ICD-10-CM | POA: Insufficient documentation

## 2018-03-23 DIAGNOSIS — Z794 Long term (current) use of insulin: Secondary | ICD-10-CM | POA: Insufficient documentation

## 2018-03-23 DIAGNOSIS — E785 Hyperlipidemia, unspecified: Secondary | ICD-10-CM | POA: Diagnosis not present

## 2018-03-23 DIAGNOSIS — I4891 Unspecified atrial fibrillation: Secondary | ICD-10-CM | POA: Diagnosis not present

## 2018-03-23 DIAGNOSIS — I110 Hypertensive heart disease with heart failure: Secondary | ICD-10-CM | POA: Insufficient documentation

## 2018-03-23 DIAGNOSIS — I252 Old myocardial infarction: Secondary | ICD-10-CM | POA: Insufficient documentation

## 2018-03-23 DIAGNOSIS — I5030 Unspecified diastolic (congestive) heart failure: Secondary | ICD-10-CM | POA: Insufficient documentation

## 2018-03-23 DIAGNOSIS — M25841 Other specified joint disorders, right hand: Secondary | ICD-10-CM | POA: Diagnosis present

## 2018-03-23 DIAGNOSIS — Z7982 Long term (current) use of aspirin: Secondary | ICD-10-CM | POA: Insufficient documentation

## 2018-03-23 DIAGNOSIS — C4911 Malignant neoplasm of connective and soft tissue of right upper limb, including shoulder: Secondary | ICD-10-CM | POA: Insufficient documentation

## 2018-03-23 DIAGNOSIS — Z79899 Other long term (current) drug therapy: Secondary | ICD-10-CM | POA: Diagnosis not present

## 2018-03-23 DIAGNOSIS — Z7901 Long term (current) use of anticoagulants: Secondary | ICD-10-CM | POA: Diagnosis not present

## 2018-03-23 HISTORY — PX: I & D EXTREMITY: SHX5045

## 2018-03-23 HISTORY — DX: Malignant (primary) neoplasm, unspecified: C80.1

## 2018-03-23 LAB — PROTIME-INR
INR: 1.53
PROTHROMBIN TIME: 18.3 s — AB (ref 11.4–15.2)

## 2018-03-23 LAB — GLUCOSE, CAPILLARY
Glucose-Capillary: 158 mg/dL — ABNORMAL HIGH (ref 65–99)
Glucose-Capillary: 138 mg/dL — ABNORMAL HIGH (ref 65–99)
Glucose-Capillary: 142 mg/dL — ABNORMAL HIGH (ref 65–99)

## 2018-03-23 SURGERY — IRRIGATION AND DEBRIDEMENT EXTREMITY
Anesthesia: Regional | Laterality: Right

## 2018-03-23 MED ORDER — DEXMEDETOMIDINE HCL IN NACL 200 MCG/50ML IV SOLN
INTRAVENOUS | Status: DC | PRN
  Administered 2018-03-23 (×2): 4 ug via INTRAVENOUS

## 2018-03-23 MED ORDER — LACTATED RINGERS IV SOLN
Freq: Once | INTRAVENOUS | Status: AC
  Administered 2018-03-23: 09:00:00 via INTRAVENOUS

## 2018-03-23 MED ORDER — MEPERIDINE HCL 50 MG/ML IJ SOLN: 6.2500 mg | INTRAMUSCULAR | Status: DC | PRN

## 2018-03-23 MED ORDER — BUPIVACAINE HCL (PF) 0.25 % IJ SOLN
INTRAMUSCULAR | Status: AC
  Filled 2018-03-23: qty 30

## 2018-03-23 MED ORDER — ONDANSETRON HCL 4 MG/2ML IJ SOLN
INTRAMUSCULAR | Status: AC
  Filled 2018-03-23: qty 2

## 2018-03-23 MED ORDER — FENTANYL CITRATE (PF) 250 MCG/5ML IJ SOLN
INTRAMUSCULAR | Status: AC
  Filled 2018-03-23: qty 5

## 2018-03-23 MED ORDER — HYDROMORPHONE HCL 2 MG/ML IJ SOLN: 0.2500 mg | INTRAMUSCULAR | Status: DC | PRN

## 2018-03-23 MED ORDER — LIDOCAINE-EPINEPHRINE 1 %-1:100000 IJ SOLN
INTRAMUSCULAR | Status: AC
  Filled 2018-03-23: qty 1

## 2018-03-23 MED ORDER — 0.9 % SODIUM CHLORIDE (POUR BTL) OPTIME
TOPICAL | Status: DC | PRN
  Administered 2018-03-23: 1000 mL

## 2018-03-23 MED ORDER — ACETAMINOPHEN 10 MG/ML IV SOLN: 1000.0000 mg | Freq: Once | INTRAVENOUS | Status: DC | PRN

## 2018-03-23 MED ORDER — PROPOFOL 10 MG/ML IV BOLUS
INTRAVENOUS | Status: DC | PRN
  Administered 2018-03-23: 10 mg via INTRAVENOUS

## 2018-03-23 MED ORDER — LIDOCAINE 2% (20 MG/ML) 5 ML SYRINGE
INTRAMUSCULAR | Status: AC
  Filled 2018-03-23: qty 5

## 2018-03-23 MED ORDER — FENTANYL CITRATE (PF) 100 MCG/2ML IJ SOLN
25.0000 ug | Freq: Once | INTRAMUSCULAR | Status: AC
  Administered 2018-03-23: 25 ug via INTRAVENOUS

## 2018-03-23 MED ORDER — LIDOCAINE-EPINEPHRINE 1 %-1:100000 IJ SOLN
INTRAMUSCULAR | Status: DC | PRN
  Administered 2018-03-23: 10 mL

## 2018-03-23 MED ORDER — ACETAMINOPHEN 500 MG PO TABS
1000.0000 mg | ORAL_TABLET | Freq: Once | ORAL | Status: AC
  Administered 2018-03-23: 1000 mg via ORAL
  Filled 2018-03-23: qty 2

## 2018-03-23 MED ORDER — CEFAZOLIN SODIUM 1 G IJ SOLR
INTRAMUSCULAR | Status: AC
  Filled 2018-03-23: qty 30

## 2018-03-23 MED ORDER — PROMETHAZINE HCL 25 MG/ML IJ SOLN: 6.2500 mg | INTRAMUSCULAR | Status: DC | PRN

## 2018-03-23 MED ORDER — CHLORHEXIDINE GLUCONATE 4 % EX LIQD: 60.0000 mL | Freq: Once | CUTANEOUS | Status: DC

## 2018-03-23 MED ORDER — DEXTROSE 5 % IV SOLN
INTRAVENOUS | Status: DC | PRN
  Administered 2018-03-23: 3 g via INTRAVENOUS

## 2018-03-23 MED ORDER — MIDAZOLAM HCL 2 MG/2ML IJ SOLN
INTRAMUSCULAR | Status: AC
  Filled 2018-03-23: qty 2

## 2018-03-23 MED ORDER — LACTATED RINGERS IV SOLN
INTRAVENOUS | Status: DC | PRN
  Administered 2018-03-23: 11:00:00 via INTRAVENOUS

## 2018-03-23 MED ORDER — PROPOFOL 1000 MG/100ML IV EMUL
INTRAVENOUS | Status: AC
  Filled 2018-03-23: qty 100

## 2018-03-23 MED ORDER — HYDROCODONE-ACETAMINOPHEN 7.5-325 MG PO TABS: 1.0000 | ORAL_TABLET | Freq: Once | ORAL | Status: DC | PRN

## 2018-03-23 MED ORDER — FENTANYL CITRATE (PF) 100 MCG/2ML IJ SOLN
INTRAMUSCULAR | Status: AC
  Filled 2018-03-23: qty 2

## 2018-03-23 SURGICAL SUPPLY — 46 items
BANDAGE ACE 3X5.8 VEL STRL LF (GAUZE/BANDAGES/DRESSINGS) IMPLANT
BANDAGE ACE 4X5 VEL STRL LF (GAUZE/BANDAGES/DRESSINGS) IMPLANT
BNDG CMPR 9X4 STRL LF SNTH (GAUZE/BANDAGES/DRESSINGS) ×1
BNDG COHESIVE 1X5 TAN STRL LF (GAUZE/BANDAGES/DRESSINGS) ×3 IMPLANT
BNDG CONFORM 2 STRL LF (GAUZE/BANDAGES/DRESSINGS) IMPLANT
BNDG ESMARK 4X9 LF (GAUZE/BANDAGES/DRESSINGS) ×3 IMPLANT
BNDG GAUZE ELAST 4 BULKY (GAUZE/BANDAGES/DRESSINGS) ×6 IMPLANT
CONT SPEC 4OZ CLIKSEAL STRL BL (MISCELLANEOUS) ×3 IMPLANT
CORDS BIPOLAR (ELECTRODE) ×3 IMPLANT
COVER SURGICAL LIGHT HANDLE (MISCELLANEOUS) IMPLANT
CUFF TOURNIQUET SINGLE 18IN (TOURNIQUET CUFF) ×3 IMPLANT
DECANTER SPIKE VIAL GLASS SM (MISCELLANEOUS) IMPLANT
DRAPE SURG 17X23 STRL (DRAPES) ×3 IMPLANT
DURAPREP 26ML APPLICATOR (WOUND CARE) ×3 IMPLANT
GAUZE PACKING IODOFORM 1/4X15 (GAUZE/BANDAGES/DRESSINGS) IMPLANT
GAUZE PACKING IODOFORM 1/4X5 (PACKING) IMPLANT
GAUZE SPONGE 4X4 12PLY STRL (GAUZE/BANDAGES/DRESSINGS) ×3 IMPLANT
GAUZE XEROFORM 1X8 LF (GAUZE/BANDAGES/DRESSINGS) ×3 IMPLANT
GLOVE ECLIPSE 6.5 STRL STRAW (GLOVE) ×3 IMPLANT
GLOVE SURG SYN 8.0 (GLOVE) ×3 IMPLANT
GOWN STRL REUS W/ TWL LRG LVL3 (GOWN DISPOSABLE) ×1 IMPLANT
GOWN STRL REUS W/ TWL XL LVL3 (GOWN DISPOSABLE) ×1 IMPLANT
GOWN STRL REUS W/TWL LRG LVL3 (GOWN DISPOSABLE) ×3
GOWN STRL REUS W/TWL XL LVL3 (GOWN DISPOSABLE) ×3
KIT BASIN OR (CUSTOM PROCEDURE TRAY) ×3 IMPLANT
KIT TURNOVER KIT B (KITS) ×3 IMPLANT
MANIFOLD NEPTUNE II (INSTRUMENTS) IMPLANT
NEEDLE HYPO 25GX1X1/2 BEV (NEEDLE) IMPLANT
NEEDLE HYPO 25X1 1.5 SAFETY (NEEDLE) ×3 IMPLANT
NS IRRIG 1000ML POUR BTL (IV SOLUTION) ×3 IMPLANT
PACK ORTHO EXTREMITY (CUSTOM PROCEDURE TRAY) ×3 IMPLANT
PAD ARMBOARD 7.5X6 YLW CONV (MISCELLANEOUS) ×6 IMPLANT
PAD CAST 4YDX4 CTTN HI CHSV (CAST SUPPLIES) ×2 IMPLANT
PADDING CAST COTTON 4X4 STRL (CAST SUPPLIES) ×6
SPONGE LAP 18X18 X RAY DECT (DISPOSABLE) ×3 IMPLANT
SUT ETHILON 4 0 PS 2 18 (SUTURE) ×6 IMPLANT
SUT VICRYL RAPIDE 4/0 PS 2 (SUTURE) IMPLANT
SWAB COLLECTION DEVICE MRSA (MISCELLANEOUS) ×3 IMPLANT
SWAB CULTURE ESWAB REG 1ML (MISCELLANEOUS) ×3 IMPLANT
SYR CONTROL 10ML LL (SYRINGE) ×3 IMPLANT
TOWEL OR 17X24 6PK STRL BLUE (TOWEL DISPOSABLE) ×3 IMPLANT
TOWEL OR 17X26 10 PK STRL BLUE (TOWEL DISPOSABLE) ×3 IMPLANT
TUBE CONNECTING 12'X1/4 (SUCTIONS) ×1
TUBE CONNECTING 12X1/4 (SUCTIONS) ×2 IMPLANT
UNDERPAD 30X30 (UNDERPADS AND DIAPERS) ×3 IMPLANT
YANKAUER SUCT BULB TIP NO VENT (SUCTIONS) ×3 IMPLANT

## 2018-03-23 NOTE — H&P (Signed)
Charles Chambers is an 82 y.o. male.   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  Past Medical History:  Diagnosis Date  . Abnormal gait   . Atrial fibrillation ([place])   . CAD (coronary artery disease)   . 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 Chronic back pain   . Congestive heart failure with left ventricular diastolic dysfunction, NYHA class 2 ([place])   . Diabetes mellitus    INSULIN DEPENDENT, CONTROLLED  . Diabetic neuropathy ([place])   . Diverticulosis of colon   . Hx of colonoscopy    10 YEARS AGO  . Hx of nonmelanoma skin cancer   . Hx of osteomyelitis   . Hyperlipidemia   . Hypertension    BENIGN ESSENTIAL  . Lower extremity venous stasis   . MI (myocardial infarction) ([place]) 09/06/1995  . Mild renal insufficiency   . Moderate mitral regurgitation   . Morbid obesity with BMI of 40.0-44.9, adult ([place])   . Obstructive sleep apnea on CPAP    does not wear CPAP, wears 2L O2 [place]  . Osteoarthritis, multiple sites   . Osteomyelitis of vertebra of lumbar region ([place])   . Status post coronary artery stent placement 09/09/1995  . Vitamin D deficiency     Past Surgical History:  Procedure Laterality Date  . ANGIOPLASTY  08/1995   STENT PLACEMENT  . ARM/ELBOW SURGERY  2016  . BACK SURGERY    . CORONARY STENT PLACEMENT  09/09/1995  . LAMINECTOMY     OPEN BIOPSY OF L4-S1  . PAROTIDECTOMY Right 06/2016  . POSTERIOR FUSION FOR OSTEOMYLITIS  11/2008  . REMOVAL OF CYST FROM EYELID  10/2010    Family History  Problem Relation Age of Onset  . Heart disease Mother 97  . Heart disease Father    Social History:  reports that he has quit smoking. He has never used smokeless tobacco. He reports that he does not drink alcohol or use drugs.  Allergies:   Allergies  Allergen Reactions  . Aleve [Naproxen Sodium] Other (See Comments)    Due to kidney function  . Baclofen Other (See Comments)    hallucinations   . Mushroom Extract Complex Nausea And Vomiting  . Neurontin [Gabapentin] Other (See Comments)    overstimulation  . Other Other (See Comments)    Narcotics cause depression, especially codeine Contrast products can not be used due to kidney function     Medications Prior to Admission  Medication Sig Dispense Refill  . acetaminophen (TYLENOL) 500 MG tablet Take 1,000 mg by mouth every 6 (six) hours as needed for moderate pain.    Marland Kitchen aspirin EC 81 MG tablet Take 81 mg by mouth daily.      Marland Kitchen atorvastatin (LIPITOR) 20 MG tablet Take 20 mg by mouth daily.      . carvedilol (COREG) 25 MG tablet Take 25 mg by mouth 2 (two) times daily with a meal.    . Cholecalciferol (VITAMIN D3) 3000 UNITS TABS Take 3,000 Units by mouth daily.     . furosemide (LASIX) 20 MG tablet See admin instructions. Take 20mg  by mouth Monday, Wednesday, Friday, may take additional days as needed for swelling.    . hydrochlorothiazide (HYDRODIURIL) 25 MG tablet Take 25 mg by mouth daily.      . insulin  lispro protamine-insulin lispro (HUMALOG 75/25) (75-25) 100 UNIT/ML SUSP Inject into the skin as directed. Patient takes 32- units every morning and 17 units every evening    . sacubitril-valsartan (ENTRESTO) 24-26 MG Take 1 tablet by mouth 2 (two) times daily. Start this after 36 hours of stopping lisinopril 60 tablet 3  . warfarin (COUMADIN) 5 MG tablet Take 5 mg by mouth daily. Take 2.5mg  by mouth Tuesday, Thursday and Saturday, and 5mg  by mouth Monday, Wednesday, Friday and Sunday      Results for orders placed or performed during the hospital encounter of 03/23/18 (from the past 48 hour(s))  Glucose, capillary     Status: Abnormal   Collection Time: 03/23/18  8:28 AM  Result Value Ref Range   Glucose-Capillary 158 (H) 65 - 99 mg/dL  Protime-INR     Status:  Abnormal   Collection Time: 03/23/18  9:06 AM  Result Value Ref Range   Prothrombin Time 18.3 (H) 11.4 - 15.2 seconds   INR 1.53     Comment: Performed at [facility], [place] [address] [place],  [address]   No results found.  Review of Systems  All other systems reviewed and are negative.   Blood pressure (!) 143/74, pulse 75, temperature (!) 97.5 F (36.4 C), temperature source Oral, resp. rate 17, height 5\' 9"  (1.753 m), weight 122.9 kg (271 lb), SpO2 98 %. Physical Exam  Constitutional: He is oriented to person, place, and time. He appears well-developed and well-nourished.  HENT:  Head: Normocephalic and atraumatic.  Neck: Normal range of motion.  Cardiovascular: Normal rate.  Respiratory: Effort normal.  Musculoskeletal:       Right hand: He exhibits decreased range of motion, tenderness and swelling.  Right thumb chronic draining mucoid cyst with significant interphalangeal joint degenerative joint disease  Neurological: He is alert and oriented to person, place, and time.  Skin: Skin is warm. There is erythema.  Psychiatric: He has a normal mood and affect. His behavior is normal. Judgment and thought content normal.     Assessment/Plan 82 year old male with chronic draining mucoid cyst right thumb with interphalangeal joint degenerative joint disease.  Have discussed with the patient and his daughter the nature of this predicament and treatment options.  We recommend excision of chronically infected mucoid cyst with interphalangeal joint debridement and rotational flap coverage as an outpatient  Schuyler Amor, MD 03/23/2018, 10:47 AM

## 2018-03-23 NOTE — Progress Notes (Addendum)
C/o unable to get his breath restless at times. Daughter at bedside. Dr Valma Cava called and informed O2 sat 100% on 2 liters CRNA at bedside Dr Valma Cava in to see pt.

## 2018-03-23 NOTE — Op Note (Signed)
NAMEBALDEMAR, Charles Chambers MEDICAL RECORD [id number] ACCOUNT [id number] DATE OF BIRTH:Nov 24, 1935 FACILITY: [facility] LOCATION: [facility] PHYSICIAN:Tivis Wherry A. Burney Gauze, MD  OPERATIVE REPORT  DATE OF PROCEDURE:  03/23/2018  PREOPERATIVE DIAGNOSIS:  Chronic right thumb draining mucoid cyst with interphalangeal joint degenerative joint disease.  POSTOPERATIVE DIAGNOSIS:  Chronic right thumb draining mucoid cyst with interphalangeal joint degenerative joint disease.  PROCEDURE:  Incision and drainage above with cultures, interphalangeal joint debridement and rotational flap coverage with mucoid cyst excision.  SURGEON:  Charlotte Crumb, MD  ASSISTANT:  None.  ANESTHESIA:  Regional with digital block and IV sedation.    COMPLICATIONS:  There were no complications.  DRAINS:  None.  CULTURES:  Sent.  DESCRIPTION OF PROCEDURE:  The patient was taken to the operating suite after the induction of adequate supraclavicular block analgesia and infiltrated with 1% lidocaine with epinephrine 1:100,000 digital block and IV sedation.  The right upper extremity  was prepped and draped in the usual sterile fashion.  An Esmarch was used to exsanguinate the limb and a forearm tourniquet was inflated to 200 mmHg.  At this point in time, the right thumb interphalangeal joint was approached where there was a chronic  draining mucoid cyst.  We carefully excised the cyst and the surrounding devitalized tissue.  We then raised a rotational flap with a radial base flap.  We debrided the interphalangeal joint and cultured it.  We then rotated the flap to cover the defect  created by the mucoid cyst and skin excision.  This was done after thorough irrigation.  We closed with a 4-0 nylon.  We dressed with Xeroform, 4 x 4s, and a compression Coban wrap.  The patient tolerated this procedure well and went to recovery room in  stable fashion.  TN/NUANCE  D:03/23/2018 T:03/23/2018 JOB:[id number]

## 2018-03-23 NOTE — Anesthesia Postprocedure Evaluation (Signed)
Anesthesia Post Note  Patient: Charles Chambers  Procedure(s) Performed: IRRIGATION AND DEBRIDEMENT WITH INTERPHANGEAL JOINT DEBREIDMENT ROTATION FLAP (Right )     Patient location during evaluation: PACU Anesthesia Type: Regional and MAC Level of consciousness: awake and alert Pain management: pain level controlled Vital Signs Assessment: post-procedure vital signs reviewed and stable Respiratory status: spontaneous breathing, nonlabored ventilation, respiratory function stable and patient connected to nasal cannula oxygen Cardiovascular status: stable and blood pressure returned to baseline Postop Assessment: no apparent nausea or vomiting Anesthetic complications: no    Last Vitals:  Vitals:   03/23/18 1335 03/23/18 1348  BP: 120/73 127/83  Pulse: (!) 45 (!) 116  Resp:  18  Temp: 36.7 C   SpO2: 90% 93%    Last Pain:  Vitals:   03/23/18 1348  TempSrc:   PainSc: 0-No pain                 Barnet Glasgow

## 2018-03-23 NOTE — Anesthesia Procedure Notes (Signed)
Procedure Name: MAC Date/Time: 03/23/2018 11:16 AM Performed by: Renato Shin, CRNA Pre-anesthesia Checklist: Patient identified, Emergency Drugs available, Suction available and Patient being monitored Patient Re-evaluated:Patient Re-evaluated prior to induction Oxygen Delivery Method: Nasal cannula Preoxygenation: Pre-oxygenation with 100% oxygen Induction Type: IV induction Placement Confirmation: positive ETCO2,  CO2 detector and breath sounds checked- equal and bilateral Dental Injury: Teeth and Oropharynx as per pre-operative assessment

## 2018-03-23 NOTE — Transfer of Care (Signed)
Immediate Anesthesia Transfer of Care Note  Patient: Charles Chambers  Procedure(s) Performed: IRRIGATION AND DEBRIDEMENT WITH INTERPHANGEAL JOINT DEBREIDMENT ROTATION FLAP (Right )  Patient Location: PACU  Anesthesia Type:MAC and Regional  Level of Consciousness: awake, alert , oriented and patient cooperative  Airway & Oxygen Therapy: Patient Spontanous Breathing and Patient connected to nasal cannula oxygen  Post-op Assessment: Report given to RN and Post -op Vital signs reviewed and stable  Post vital signs: Reviewed and stable  Last Vitals:  Vitals Value Taken Time  BP 128/79 03/23/2018 11:50 AM  Temp    Pulse 56 03/23/2018 11:53 AM  Resp 18 03/23/2018 11:53 AM  SpO2 100 % 03/23/2018 11:53 AM  Vitals shown include unvalidated device data.  Last Pain:  Vitals:   03/23/18 0849  TempSrc:   PainSc: 0-No pain      Patients Stated Pain Goal: 2 (48/54/62 7035)  Complications: No apparent anesthesia complications

## 2018-03-23 NOTE — Anesthesia Procedure Notes (Signed)
Anesthesia Regional Block: Supraclavicular block   Pre-Anesthetic Checklist: ,, timeout performed, Correct Patient, Correct Site, Correct Laterality, Correct Procedure, Correct Position, site marked, Risks and benefits discussed,  Surgical consent,  Pre-op evaluation,  At surgeon's request and post-op pain management  Laterality: Right  Prep: chloraprep       Needles:  Injection technique: Single-shot  Needle Type: Echogenic Stimulator Needle     Needle Length: 9cm  Needle Gauge: 22     Additional Needles:   Procedures:,,,, ultrasound used (permanent image in chart),,,,  Narrative:  Start time: 03/23/2018 10:32 AM End time: 03/23/2018 10:45 AM Injection made incrementally with aspirations every 5 mL.  Performed by: Personally  Anesthesiologist: Barnet Glasgow, MD

## 2018-03-23 NOTE — Op Note (Signed)
See note [id number]

## 2018-03-24 ENCOUNTER — Encounter (HOSPITAL_COMMUNITY): Payer: Self-pay | Admitting: Orthopedic Surgery

## 2018-03-28 LAB — AEROBIC/ANAEROBIC CULTURE W GRAM STAIN (SURGICAL/DEEP WOUND): Culture: NORMAL

## 2018-04-03 ENCOUNTER — Ambulatory Visit (INDEPENDENT_AMBULATORY_CARE_PROVIDER_SITE_OTHER): Payer: Medicare Other | Admitting: Internal Medicine

## 2018-04-03 ENCOUNTER — Encounter: Payer: Self-pay | Admitting: Internal Medicine

## 2018-04-03 VITALS — BP 90/62 | HR 60 | Ht 69.0 in | Wt 265.6 lb

## 2018-04-03 DIAGNOSIS — I4821 Permanent atrial fibrillation: Secondary | ICD-10-CM

## 2018-04-03 DIAGNOSIS — I255 Ischemic cardiomyopathy: Secondary | ICD-10-CM

## 2018-04-03 DIAGNOSIS — I482 Chronic atrial fibrillation: Secondary | ICD-10-CM | POA: Diagnosis not present

## 2018-04-03 DIAGNOSIS — G4733 Obstructive sleep apnea (adult) (pediatric): Secondary | ICD-10-CM | POA: Diagnosis not present

## 2018-04-03 DIAGNOSIS — I519 Heart disease, unspecified: Secondary | ICD-10-CM | POA: Diagnosis not present

## 2018-04-03 DIAGNOSIS — I130 Hypertensive heart and chronic kidney disease with heart failure and stage 1 through stage 4 chronic kidney disease, or unspecified chronic kidney disease: Secondary | ICD-10-CM

## 2018-04-03 NOTE — Patient Instructions (Addendum)
Medication Instructions:  Continue all current medications.  Labwork: none  Testing/Procedures: none  Follow-Up: 2 months   Any Other Special Instructions Will Be Listed Below (If Applicable).  If you need a refill on your cardiac medications before your next appointment, please call your pharmacy.  

## 2018-04-03 NOTE — Progress Notes (Signed)
PCP: Earney Mallet, MD Primary Cardiologist: previously Dr Darral Dash Primary EP: Dr Rayann Heman  Charles Chambers is a 82 y.o. male who presents today for routine electrophysiology followup.  Since last being seen in our clinic, the patient reports doing reasonably well.  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  His daughter is very concerned about this.  He has lost 19 lbs over the past 2 months!  SOB is stable.  He is fatigued and sleeps most of the morning. Edema is stable.  Today, he denies symptoms of palpitations, chest pain, dizziness, presyncope, or syncope.  The patient is otherwise without complaint today.   Past Medical History:  Diagnosis Date  . Abnormal gait   . Atrial fibrillation ([place])   . CAD (coronary artery disease)   . 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 Chronic back pain   . Congestive heart failure with left ventricular diastolic dysfunction, NYHA class 2 ([place])   . Diabetes mellitus    INSULIN DEPENDENT, CONTROLLED  . Diabetic neuropathy ([place])   . Diverticulosis of colon   . Hx of colonoscopy    10 YEARS AGO  . Hx of nonmelanoma skin cancer   . Hx of osteomyelitis   . Hyperlipidemia   . Hypertension    BENIGN ESSENTIAL  . Lower extremity venous stasis   . MI (myocardial infarction) ([place]) 09/06/1995  . Mild renal insufficiency   . Moderate mitral regurgitation   . Morbid obesity with BMI of 40.0-44.9, adult ([place])   . Obstructive sleep apnea on CPAP    does not wear CPAP, wears 2L O2 [place]  . Osteoarthritis, multiple sites   . Osteomyelitis of vertebra of lumbar region ([place])   . Status post coronary artery stent placement 09/09/1995  . Vitamin D deficiency    Past Surgical History:  Procedure Laterality Date  . ANGIOPLASTY  08/1995   STENT PLACEMENT  . ARM/ELBOW SURGERY  2016  . BACK SURGERY    . CORONARY STENT PLACEMENT  09/09/1995  . I&D EXTREMITY Right 03/23/2018   Procedure: IRRIGATION AND DEBRIDEMENT  WITH INTERPHANGEAL JOINT [place] ROTATION FLAP;  Surgeon: Charlotte Crumb, MD;  Location: [facility];  Service: Orthopedics;  Laterality: Right;  . LAMINECTOMY     OPEN BIOPSY OF L4-S1  . PAROTIDECTOMY Right 06/2016  . POSTERIOR FUSION FOR OSTEOMYLITIS  11/2008  . REMOVAL OF CYST FROM EYELID  10/2010    ROS- all systems are reviewed and negatives except as per HPI above  Current Outpatient Medications  Medication Sig Dispense Refill  . acetaminophen (TYLENOL) 500 MG tablet Take 1,000 mg by mouth every 6 (six) hours as needed for moderate pain.    Marland Kitchen aspirin EC 81 MG tablet Take 81 mg by mouth daily.      Marland Kitchen atorvastatin (LIPITOR) 20 MG tablet Take 20 mg by mouth daily.      . carvedilol (COREG) 25 MG tablet Take 25 mg by mouth 2 (two) times daily with a meal.    . Cholecalciferol (VITAMIN D3) 3000 UNITS TABS Take 3,000 Units by mouth daily.     . furosemide (LASIX) 20 MG tablet See admin instructions. Take 20mg  by mouth Monday, Wednesday, Friday, may take additional days as needed for swelling.    . hydrochlorothiazide (HYDRODIURIL) 25 MG tablet Take 25 mg by mouth daily.      . insulin lispro protamine-insulin lispro (HUMALOG 75/25) (75-25) 100 UNIT/ML SUSP Inject into the skin  as directed. Patient takes 32- units every morning and 17 units every evening    . sacubitril-valsartan (ENTRESTO) 24-26 MG Take 1 tablet by mouth 2 (two) times daily. Start this after 36 hours of stopping lisinopril 60 tablet 3  . warfarin (COUMADIN) 5 MG tablet Take 5 mg by mouth daily. Take 2.5mg  by mouth Tuesday, Thursday and Saturday, and 5mg  by mouth Monday, Wednesday, Friday and Sunday     No current facility-administered medications for this visit.     Physical Exam: Vitals:   04/03/18 0825 04/03/18 0839  BP: 90/60 90/62  Pulse: 60 60  SpO2: 95%   Weight: 265 lb 9.6 oz (120.5 kg)   Height: 5\' 9"  (1.753 m)     GEN- The patient is elderly and frailappearing, alert and oriented x 3 today.   Head-  normocephalic, atraumatic Eyes-  Sclera clear, conjunctiva pink Ears- hearing intact Oropharynx- clear Lungs- Clear to ausculation bilaterally, normal work of breathing Heart- irregular rate and rhythm, no murmurs, rubs or gallops, PMI not laterally displaced GI- soft, NT, ND, + BS Extremities- no clubbing, cyanosis, +2 edema with chronic venous stasis changes  Wt Readings from Last 3 Encounters:  04/03/18 265 lb 9.6 oz (120.5 kg)  03/23/18 271 lb (122.9 kg)  02/20/18 281 lb 12.8 oz (127.8 kg)   Labs from primary care are reviewed  Assessment and Plan:  1. Chronic systolic dysfunction/ ischemic CM He seems stable optivolemic today 2 gram sodium diet Cannot titrate entresto today due to low BP  2. Morning fatigue He has sleep apnea but does not use CPAP.  This would likely be beneficial for him.  3. Permanent afib Rate controlled on coumadin  4. CAD No ischemic symptoms Given advanced age and fragility, would advise conservative management going forward  5. HTN Much improved with entresto  6. Obesity Body mass index is 39.22 kg/m. 19 lbs weight loss since last visit with lifestyle modification!  Return to see me in 2 months  Thompson Grayer MD, [facility] 04/03/2018 9:32 AM

## 2018-04-13 ENCOUNTER — Telehealth: Payer: Self-pay | Admitting: Internal Medicine

## 2018-04-13 ENCOUNTER — Encounter: Payer: Self-pay | Admitting: *Deleted

## 2018-04-13 NOTE — Telephone Encounter (Signed)
Charles Chambers -daughter called stating that patient saw his PCP last week and labs were drawn - States that his BUN and Creatine are elevated.  Daughter has concerns about patient being on Entresto .  Please call [phone]

## 2018-04-14 ENCOUNTER — Telehealth: Payer: Self-pay | Admitting: Internal Medicine

## 2018-04-14 NOTE — Telephone Encounter (Signed)
Patient's daughter [facility]) stated that patient is not tolerating  Entresto. Patient saw his PCP on 04/10/18 and had lab work done. Patient's Creatinine is 1.62, BUN 44, and GFR 40. Patient's SBP is running in the 80's and 90's. HR is in the 50's. PCP decreased Coreg to 12.5 mg BID and stopped Lasix. Patient's daughter stated patient has been weaker on Entresto and stated it is not working for him. PCP is planning to recheck lab work this week and possibly stop Entresto. Patient's daughter is wanting patient to stop Entresto and go back to lisinopril. Will forward to Dr. Rayann Heman for advisement.

## 2018-04-14 NOTE — Telephone Encounter (Signed)
New Message:      Pt c/o medication issue:  1. Name of Medication: sacubitril-valsartan (ENTRESTO) 24-26 MG  2. How are you currently taking this medication (dosage and times per day)? Take 1 tablet by mouth 2 (two) times daily. Start this after 36 hours of stopping lisinopril  3. Are you having a reaction (difficulty breathing--STAT)? No  4. What is your medication issue? Pt's daughter states that this medication is not working for the pt. She states his creatine is very high and his kidney function is being affected by this medication. She states the pt's BP is also been reading low.

## 2018-04-14 NOTE — Telephone Encounter (Signed)
Will defer to PCP

## 2018-04-15 NOTE — Telephone Encounter (Signed)
Message addressed by Dr. Rayann Heman.

## 2018-04-16 ENCOUNTER — Telehealth: Payer: Self-pay

## 2018-04-16 NOTE — Telephone Encounter (Signed)
Left generic message on VM identifying Dr. Rayann Heman only.  Left message stating-"Per Dr. Rayann Heman he will defer to Pt's PCP"  Advised to call office if any further questions or message me via MyChart.  No further action at this time.

## 2018-06-01 ENCOUNTER — Other Ambulatory Visit: Payer: Self-pay | Admitting: Orthopedic Surgery

## 2018-06-01 ENCOUNTER — Telehealth: Payer: Self-pay | Admitting: Internal Medicine

## 2018-06-01 NOTE — Telephone Encounter (Signed)
Noted. All device management information is handled via Fax from [facility] system.

## 2018-06-01 NOTE — Telephone Encounter (Signed)
Daughter called to  Give heads up about another surgical clearance coming for a second surgery

## 2018-06-03 ENCOUNTER — Other Ambulatory Visit: Payer: Self-pay | Admitting: Orthopedic Surgery

## 2018-06-03 ENCOUNTER — Other Ambulatory Visit: Payer: Self-pay

## 2018-06-03 ENCOUNTER — Encounter (HOSPITAL_COMMUNITY): Payer: Self-pay | Admitting: *Deleted

## 2018-06-03 NOTE — Progress Notes (Signed)
Spoke with pt's daughter, Leota Jacobsen. Power of attorney is on file. Pt does have an extensive cardiac history. No recent chest pain or sob. Pt's cardiologist is Dr. Thompson Grayer. Pt is a type 2 diabetic. Last A1C was 6.1 on 03/13/18. Katharine Look states pt's fasting blood sugar is usually between 110-120. Instructed Katharine Look to have pt take 70% of his regular dose of 75/25 Insulin Thursday evening (will take 12 units) and none the morning of surgery. Instructed pt to check his blood sugar when he gets up Friday AM. If blood sugar is 70 or below, treat with 1/2 cup of clear juice (apple or cranberry) and recheck blood sugar 15 minutes after drinking juice.

## 2018-06-03 NOTE — Anesthesia Preprocedure Evaluation (Addendum)
Anesthesia Evaluation  Patient identified by MRN, date of birth, ID band Patient awake    Reviewed: Allergy & Precautions, NPO status , Patient's Chart, lab work & pertinent test results, reviewed documented beta blocker date and time   Airway Mallampati: III       Dental  (+) Poor Dentition   Pulmonary former smoker,    breath sounds clear to auscultation       Cardiovascular hypertension, Pt. on medications and Pt. on home beta blockers +CHF  Normal cardiovascular exam Rhythm:Regular Rate:Normal  Class II NYHA. EF 35-40%   Neuro/Psych    GI/Hepatic   Endo/Other  diabetes, Insulin Dependent  Renal/GU      Musculoskeletal   Abdominal (+) + obese,   Peds  Hematology   Anesthesia Other Findings Jacinta Shoe, PA-C Physician Assistant Anesthesiology Progress Notes Addendum Date of Service:  06/03/2018 4:33 PM         Show:Clear all ManualTemplateCopied  Added by: Jacinta Shoe, PA-C  Hover for details Anesthesia Chart Review: SAME DAY WORK-UP   Case:  [id number] Date/Time:  06/05/18 0715  Procedure:  RIGHT THUMB REVISION AMPUTATION (Right ) - REQUEST NO DRUGS MONITOR ONLY  Anesthesia type:  Monitor Anesthesia Care  Pre-op diagnosis:  RIGHT THUMB SQUAMOUS CELL CARCINOMA  Location:  [facility] / [facility] OR  Surgeon:  Charlotte Crumb, MD    DISCUSSION: Patient is a 82 year old male scheduled for the above procedure. He underwent I&D of right thumb interphalangeal joint and rotational flap coverage with cyst excision on 03/23/18. Pathology showed recurrent squamous cell carcinoma. At follow-up, exam showed recurrence of this mass so interphalangeal joint level amputation with volar advancement flap to cover the defect was recommended.   Although case is posted for MAC, per Jeani Hawking at Dr. Bertis Ruddy office, patient/family only wanted procedure done under "straight local anesthetic as he had a  significant reaction to his supraclavicular block from his previous surgery" (reportedly devloped SOB, hoarseness, BP change). Dr. Burney Gauze is willing to do procedure with straight local with anesthesia monitoring. He is to continue ASA and warfarin perioperatively.  History includes CAD/MI (BMS to LAD 09/10/95, [facility]), ischemic cardiomyopathy (EF 35-40% '18), CHF, permanent afib, DM (diet controlled), HTN, OSA (uses O2 at night)  Discussed with anesthesiologist Oren Bracket, MD. As above, straight local with anesthesia monitoring requested. Anesthesiologist will evaluate on the day of surgery. (UPDATE 7/32/20 2:54 PM: Elmo Putt from Dr. Bertis Ruddy office called. He has ordered pre-operative antibiotic and is planning to administer local while patient is in Holding before patient is taken into the OR. Elmo Putt has coordinated this through April Green, RN who will have supplies for local available.)   PROVIDERS: Earney Mallet, MD is PCP Thompson Grayer, MD is cardiologist. Last visit 04/03/18. (He was previously followed by Raechel Chute, MD in [place], [place] Patient stable at that time, although with reported weight loss of 19 lbs in 2 months. No ischemic symptoms. He wrote, "Given advanced age and fragility, would advise conservative management going forward." Two month follow-up planned. In the interim, patient's PCP has had to adjust some of patient's medications given hypotension and an increase in Cr to 1.62, BUN 440, GFR 40. Dr. Rayann Heman is deferring these adjustments to patient's PCP.     LABS: He is for labs on the day of surgery.   IMAGES: CXR 09/02/17 ([facility]):Chronic lung changes;no acute findings  EKG: 02/20/18: Atrial fibrillation at 82 bpm with premature ventricular or aberrantly conducted complexes. LAD.  Nonspecific intraventricular block. Inferior infarct, age undetermined. Anterolateral infarct, age undetermined.   CV: Echo 05/15/17 ([facility]): 1. Moderate ischemic cardiomyopathy and two-vessel territories (LAD and RCA). Estimated EF 35-40%limited by lack of contrast and poor endocardial resolution. 2. Normal RV function with normal RAP. 3. Structurally and functionally grossly normal cardiac valves. Mild central MR. Trace AI.    Past Medical History: Diagnosis Date         Reproductive/Obstetrics                          Anesthesia Physical Anesthesia Plan  ASA: III  Anesthesia Plan: MAC   Post-op Pain Management:    Induction:   PONV Risk Score and Plan:   Airway Management Planned: Nasal Cannula  Additional Equipment:   Intra-op Plan:   Post-operative Plan:   Informed Consent: I have reviewed the patients History and Physical, chart, labs and discussed the procedure including the risks, benefits and alternatives for the proposed anesthesia with the patient or authorized representative who has indicated his/her understanding and acceptance.     Plan Discussed with: CRNA  Anesthesia Plan Comments: (See my PAT note. Myra Gianotti, PA-C)      Anesthesia Quick Evaluation

## 2018-06-03 NOTE — Progress Notes (Addendum)
Anesthesia Chart Review: Kathleene Hazel  Case:  [id number] Date/Time:  06/05/18 0715   Procedure:  RIGHT THUMB REVISION AMPUTATION (Right ) - REQUEST NO DRUGS MONITOR ONLY   Anesthesia type:  Monitor Anesthesia Care   Pre-op diagnosis:  RIGHT THUMB SQUAMOUS CELL CARCINOMA   Location:  [facility] OR ROOM 12 / [facility] OR   Surgeon:  Charlotte Crumb, MD      DISCUSSION: Patient is a 82 year old male scheduled for the above procedure. He underwent I&D of right thumb interphalangeal joint and rotational flap coverage with cyst excision on 03/23/18. Pathology showed recurrent squamous cell carcinoma. At follow-up, exam showed recurrence of this mass so interphalangeal joint level amputation with volar advancement flap to cover the defect was recommended.   Although case is posted for MAC, per Jeani Hawking at Dr. Bertis Ruddy office, patient/family only wanted procedure done under "straight local anesthetic as he had a significant reaction to his supraclavicular block from his previous surgery" (reportedly devloped SOB, hoarseness, BP change). Dr. Burney Gauze is willing to do procedure with straight local with anesthesia monitoring. He is to continue ASA and warfarin perioperatively.  History includes CAD/MI (BMS to LAD 09/10/95, [facility]), ischemic cardiomyopathy (EF 35-40% '18), CHF, permanent afib, DM (diet controlled), HTN, OSA (uses O2 at night)  Discussed with anesthesiologist Oren Bracket, MD. As above, straight local with anesthesia monitoring requested. Anesthesiologist will evaluate on the day of surgery. (UPDATE 7/34/03 7:09 PM: Elmo Putt from Dr. Bertis Ruddy office called. He has ordered pre-operative antibiotic and is planning to administer local while patient is in Holding before patient is taken into the OR. Elmo Putt has coordinated this through April Green, RN who will have supplies for local available.)   PROVIDERS: Earney Mallet, MD is PCP Thompson Grayer, MD is cardiologist. Last visit 04/03/18. (He was previously  followed by Raechel Chute, MD in [place], [place].). Patient stable at that time, although with reported weight loss of 19 lbs in 2 months. No ischemic symptoms. He wrote, "Given advanced age and fragility, would advise conservative management going forward." Two month follow-up planned. In the interim, patient's PCP has had to adjust some of patient's medications given hypotension and an increase in Cr to 1.62, BUN 440, GFR 40. Dr. Rayann Heman is deferring these adjustments to patient's PCP.     LABS: He is for labs on the day of surgery.   IMAGES: CXR 09/02/17 ([facility]): Chronic lung changes; no acute findings  EKG: 02/20/18: Atrial fibrillation at 82 bpm with premature ventricular or aberrantly conducted complexes.  LAD.  Nonspecific intraventricular block.  Inferior infarct, age undetermined.  Anterolateral infarct, age undetermined.   CV: Echo 05/15/17 ([facility]):  1.  Moderate ischemic cardiomyopathy and two-vessel territories (LAD and RCA).  Estimated EF 35-40% limited by lack of contrast and poor endocardial resolution. 2.  Normal RV function with normal RAP. 3.  Structurally and functionally grossly normal cardiac valves. Mild central MR. Trace AI.   Past Medical History:  Diagnosis Date  . Abnormal gait   . Atrial fibrillation ([place])   . CAD (coronary artery disease)   . 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 Chronic back pain   . Congestive heart failure with left ventricular diastolic dysfunction, NYHA class 2 ([place])   . Diabetes mellitus    INSULIN DEPENDENT, CONTROLLED  . Diabetic neuropathy ([place])   . Diverticulosis of colon   . Hx of colonoscopy    10 YEARS AGO  . Hx of nonmelanoma skin  cancer   . Hx of osteomyelitis   . Hyperlipidemia   . Hypertension    BENIGN ESSENTIAL  . Lower extremity venous stasis   . MI (myocardial infarction) ([facility]) 09/06/1995  . Mild renal insufficiency   . Moderate mitral regurgitation   . Morbid  obesity with BMI of 40.0-44.9, adult ([place])   . Obstructive sleep apnea on CPAP    does not wear CPAP, wears 2L O2 [place]  . Osteoarthritis, multiple sites   . Osteomyelitis of vertebra of lumbar region ([place])   . Status post coronary artery stent placement 09/09/1995  . Vitamin D deficiency     Past Surgical History:  Procedure Laterality Date  . ANGIOPLASTY  08/1995   STENT PLACEMENT  . ARM/ELBOW SURGERY  2016  . BACK SURGERY    . CORONARY STENT PLACEMENT  09/09/1995  . I&D EXTREMITY Right 03/23/2018   Procedure: IRRIGATION AND DEBRIDEMENT WITH INTERPHANGEAL JOINT [place] ROTATION FLAP;  Surgeon: Charlotte Crumb, MD;  Location: [facility];  Service: Orthopedics;  Laterality: Right;  . LAMINECTOMY     OPEN BIOPSY OF L4-S1  . PAROTIDECTOMY Right 06/2016  . POSTERIOR FUSION FOR OSTEOMYLITIS  11/2008  . REMOVAL OF CYST FROM EYELID  10/2010    MEDICATIONS: No current facility-administered medications for this encounter.    Marland Kitchen acetaminophen (TYLENOL) 500 MG tablet  . aspirin EC 81 MG tablet  . atorvastatin (LIPITOR) 20 MG tablet  . carvedilol (COREG) 25 MG tablet  . Cholecalciferol (VITAMIN D3) 3000 UNITS TABS  . furosemide (LASIX) 20 MG tablet  . hydrochlorothiazide (HYDRODIURIL) 25 MG tablet  . insulin lispro protamine-insulin lispro (HUMALOG 75/25) (75-25) 100 UNIT/ML SUSP  . lisinopril (PRINIVIL,ZESTRIL) 20 MG tablet  . nitroGLYCERIN (NITROSTAT) 0.4 MG SL tablet  . OXYGEN  . warfarin (COUMADIN) 5 MG tablet  . sacubitril-valsartan (ENTRESTO) 24-26 MG   [facility] Short Stay Center/Anesthesiology Phone [phone] 06/03/2018 4:57 PM

## 2018-06-04 ENCOUNTER — Other Ambulatory Visit: Payer: Self-pay | Admitting: Orthopedic Surgery

## 2018-06-04 MED ORDER — DEXTROSE 5 % IV SOLN
3.0000 g | INTRAVENOUS | Status: DC
  Filled 2018-06-04: qty 3000

## 2018-06-05 ENCOUNTER — Ambulatory Visit (HOSPITAL_COMMUNITY)
Admission: RE | Admit: 2018-06-05 | Discharge: 2018-06-05 | Disposition: A | Discharge status: Home / Self Care | Payer: Medicare Other | Source: Ambulatory Visit | Attending: Orthopedic Surgery | Admitting: Orthopedic Surgery

## 2018-06-05 ENCOUNTER — Encounter (HOSPITAL_COMMUNITY)
Admission: RE | Disposition: A | Discharge status: Home / Self Care | Payer: Self-pay | Source: Ambulatory Visit | Attending: Orthopedic Surgery

## 2018-06-05 ENCOUNTER — Ambulatory Visit (HOSPITAL_COMMUNITY): Payer: Medicare Other | Admitting: Vascular Surgery

## 2018-06-05 ENCOUNTER — Encounter (HOSPITAL_COMMUNITY): Payer: Self-pay | Admitting: *Deleted

## 2018-06-05 DIAGNOSIS — Z7982 Long term (current) use of aspirin: Secondary | ICD-10-CM | POA: Insufficient documentation

## 2018-06-05 DIAGNOSIS — G4733 Obstructive sleep apnea (adult) (pediatric): Secondary | ICD-10-CM | POA: Diagnosis not present

## 2018-06-05 DIAGNOSIS — E785 Hyperlipidemia, unspecified: Secondary | ICD-10-CM | POA: Diagnosis not present

## 2018-06-05 DIAGNOSIS — E1140 Type 2 diabetes mellitus with diabetic neuropathy, unspecified: Secondary | ICD-10-CM | POA: Insufficient documentation

## 2018-06-05 DIAGNOSIS — Z6841 Body Mass Index (BMI) 40.0 and over, adult: Secondary | ICD-10-CM | POA: Insufficient documentation

## 2018-06-05 DIAGNOSIS — Z79899 Other long term (current) drug therapy: Secondary | ICD-10-CM | POA: Insufficient documentation

## 2018-06-05 DIAGNOSIS — Z955 Presence of coronary angioplasty implant and graft: Secondary | ICD-10-CM | POA: Diagnosis not present

## 2018-06-05 DIAGNOSIS — Z87891 Personal history of nicotine dependence: Secondary | ICD-10-CM | POA: Insufficient documentation

## 2018-06-05 DIAGNOSIS — C44622 Squamous cell carcinoma of skin of right upper limb, including shoulder: Secondary | ICD-10-CM | POA: Diagnosis present

## 2018-06-05 DIAGNOSIS — I10 Essential (primary) hypertension: Secondary | ICD-10-CM | POA: Diagnosis not present

## 2018-06-05 DIAGNOSIS — Z886 Allergy status to analgesic agent status: Secondary | ICD-10-CM | POA: Diagnosis not present

## 2018-06-05 DIAGNOSIS — I2510 Atherosclerotic heart disease of native coronary artery without angina pectoris: Secondary | ICD-10-CM | POA: Insufficient documentation

## 2018-06-05 DIAGNOSIS — Z888 Allergy status to other drugs, medicaments and biological substances status: Secondary | ICD-10-CM | POA: Diagnosis not present

## 2018-06-05 DIAGNOSIS — Z885 Allergy status to narcotic agent status: Secondary | ICD-10-CM | POA: Insufficient documentation

## 2018-06-05 DIAGNOSIS — Z7901 Long term (current) use of anticoagulants: Secondary | ICD-10-CM | POA: Insufficient documentation

## 2018-06-05 DIAGNOSIS — C4011 Malignant neoplasm of short bones of right upper limb: Secondary | ICD-10-CM | POA: Diagnosis not present

## 2018-06-05 DIAGNOSIS — I252 Old myocardial infarction: Secondary | ICD-10-CM | POA: Insufficient documentation

## 2018-06-05 DIAGNOSIS — Z794 Long term (current) use of insulin: Secondary | ICD-10-CM | POA: Insufficient documentation

## 2018-06-05 DIAGNOSIS — I4891 Unspecified atrial fibrillation: Secondary | ICD-10-CM | POA: Diagnosis not present

## 2018-06-05 DIAGNOSIS — E559 Vitamin D deficiency, unspecified: Secondary | ICD-10-CM | POA: Diagnosis not present

## 2018-06-05 HISTORY — PX: AMPUTATION: SHX166

## 2018-06-05 HISTORY — DX: Pneumonia, unspecified organism: J18.9

## 2018-06-05 HISTORY — DX: Other complications of anesthesia, initial encounter: T88.59XA

## 2018-06-05 HISTORY — DX: Adverse effect of unspecified anesthetic, initial encounter: T41.45XA

## 2018-06-05 LAB — CBC
HCT: 42.1 % (ref 39.0–52.0)
Hemoglobin: 13.6 g/dL (ref 13.0–17.0)
MCH: 31 pg (ref 26.0–34.0)
MCHC: 32.3 g/dL (ref 30.0–36.0)
MCV: 95.9 fL (ref 78.0–100.0)
Platelets: 165 10*3/uL (ref 150–400)
RBC: 4.39 MIL/uL (ref 4.22–5.81)
RDW: 14.5 % (ref 11.5–15.5)
WBC: 5.8 10*3/uL (ref 4.0–10.5)

## 2018-06-05 LAB — BASIC METABOLIC PANEL
Anion gap: 9 (ref 5–15)
BUN: 30 mg/dL — ABNORMAL HIGH (ref 8–23)
CO2: 27 mmol/L (ref 22–32)
Calcium: 9.6 mg/dL (ref 8.9–10.3)
Chloride: 97 mmol/L — ABNORMAL LOW (ref 98–111)
Creatinine, Ser: 1.21 mg/dL (ref 0.61–1.24)
GFR calc Af Amer: >60 mL/min (ref >60)
GFR calc non Af Amer: 55 mL/min — ABNORMAL LOW (ref >60)
Glucose, Bld: 147 mg/dL — ABNORMAL HIGH (ref 70–99)
Potassium: 4.4 mmol/L (ref 3.5–5.1)
Sodium: 133 mmol/L — ABNORMAL LOW (ref 135–145)

## 2018-06-05 LAB — PROTIME-INR
INR: 1.6
Prothrombin Time: 18.9 seconds — ABNORMAL HIGH (ref 11.4–15.2)

## 2018-06-05 LAB — GLUCOSE, CAPILLARY
Glucose-Capillary: 141 mg/dL — ABNORMAL HIGH (ref 70–99)
Glucose-Capillary: 147 mg/dL — ABNORMAL HIGH (ref 70–99)

## 2018-06-05 SURGERY — AMPUTATION DIGIT
Anesthesia: Monitor Anesthesia Care | Site: Thumb | Laterality: Right

## 2018-06-05 MED ORDER — 0.9 % SODIUM CHLORIDE (POUR BTL) OPTIME
TOPICAL | Status: DC | PRN
  Administered 2018-06-05: 1000 mL

## 2018-06-05 MED ORDER — CHLORHEXIDINE GLUCONATE 4 % EX LIQD: 60.0000 mL | Freq: Once | CUTANEOUS | Status: DC

## 2018-06-05 MED ORDER — LIDOCAINE-EPINEPHRINE 1 %-1:100000 IJ SOLN
INTRAMUSCULAR | Status: AC
  Filled 2018-06-05: qty 1

## 2018-06-05 SURGICAL SUPPLY — 51 items
BANDAGE ACE 3X5.8 VEL STRL LF (GAUZE/BANDAGES/DRESSINGS) ×3 IMPLANT
BANDAGE COBAN STERILE 2 (GAUZE/BANDAGES/DRESSINGS) ×3 IMPLANT
BNDG CMPR 9X4 STRL LF SNTH (GAUZE/BANDAGES/DRESSINGS) ×1
BNDG COHESIVE 1X5 TAN STRL LF (GAUZE/BANDAGES/DRESSINGS) IMPLANT
BNDG CONFORM 2 STRL LF (GAUZE/BANDAGES/DRESSINGS) IMPLANT
BNDG ESMARK 4X9 LF (GAUZE/BANDAGES/DRESSINGS) ×3 IMPLANT
BNDG GAUZE ELAST 4 BULKY (GAUZE/BANDAGES/DRESSINGS) ×3 IMPLANT
CLOSURE WOUND 1/2 X4 (GAUZE/BANDAGES/DRESSINGS)
CONT SPEC 4OZ CLIKSEAL STRL BL (MISCELLANEOUS) ×3 IMPLANT
CORDS BIPOLAR (ELECTRODE) ×3 IMPLANT
COVER SURGICAL LIGHT HANDLE (MISCELLANEOUS) IMPLANT
CUFF TOURNIQUET SINGLE 18IN (TOURNIQUET CUFF) IMPLANT
CUFF TOURNIQUET SINGLE 24IN (TOURNIQUET CUFF) IMPLANT
DRAIN PENROSE 18X1/4 LTX STRL (WOUND CARE) ×3 IMPLANT
DRAPE OEC MINIVIEW 54X84 (DRAPES) IMPLANT
DRAPE SURG 17X23 STRL (DRAPES) ×3 IMPLANT
DURAPREP 26ML APPLICATOR (WOUND CARE) ×3 IMPLANT
GAUZE SPONGE 2X2 8PLY STRL LF (GAUZE/BANDAGES/DRESSINGS) IMPLANT
GAUZE SPONGE 4X4 12PLY STRL (GAUZE/BANDAGES/DRESSINGS) IMPLANT
GAUZE SPONGE 4X4 12PLY STRL LF (GAUZE/BANDAGES/DRESSINGS) ×3 IMPLANT
GAUZE SPONGE 4X4 16PLY XRAY LF (GAUZE/BANDAGES/DRESSINGS) ×3 IMPLANT
GAUZE XEROFORM 1X8 LF (GAUZE/BANDAGES/DRESSINGS) ×3 IMPLANT
GLOVE BIOGEL PI IND STRL 7.5 (GLOVE) ×2 IMPLANT
GLOVE BIOGEL PI INDICATOR 7.5 (GLOVE) ×4
GLOVE SURG SS PI 7.5 STRL IVOR (GLOVE) ×9 IMPLANT
GLOVE SURG SYN 8.0 (GLOVE) ×6 IMPLANT
GOWN STRL REUS W/ TWL LRG LVL3 (GOWN DISPOSABLE) ×2 IMPLANT
GOWN STRL REUS W/ TWL XL LVL3 (GOWN DISPOSABLE) ×1 IMPLANT
GOWN STRL REUS W/TWL LRG LVL3 (GOWN DISPOSABLE) ×6
GOWN STRL REUS W/TWL XL LVL3 (GOWN DISPOSABLE) ×3
KIT BASIN OR (CUSTOM PROCEDURE TRAY) ×3 IMPLANT
KIT TURNOVER KIT B (KITS) ×3 IMPLANT
MANIFOLD NEPTUNE II (INSTRUMENTS) IMPLANT
NEEDLE HYPO 25GX1X1/2 BEV (NEEDLE) IMPLANT
NS IRRIG 1000ML POUR BTL (IV SOLUTION) ×3 IMPLANT
PACK ORTHO EXTREMITY (CUSTOM PROCEDURE TRAY) ×3 IMPLANT
PAD ARMBOARD 7.5X6 YLW CONV (MISCELLANEOUS) ×6 IMPLANT
PAD CAST 3X4 CTTN HI CHSV (CAST SUPPLIES) ×1 IMPLANT
PADDING CAST COTTON 3X4 STRL (CAST SUPPLIES) ×3
SPECIMEN JAR SMALL (MISCELLANEOUS) ×3 IMPLANT
SPONGE GAUZE 2X2 STER 10/PKG (GAUZE/BANDAGES/DRESSINGS)
STRIP CLOSURE SKIN 1/2X4 (GAUZE/BANDAGES/DRESSINGS) IMPLANT
SUCTION FRAZIER HANDLE 10FR (MISCELLANEOUS) ×2
SUCTION TUBE FRAZIER 10FR DISP (MISCELLANEOUS) ×1 IMPLANT
SUT MON AB 5-0 PS2 18 (SUTURE) ×6 IMPLANT
TOWEL OR 17X24 6PK STRL BLUE (TOWEL DISPOSABLE) ×3 IMPLANT
TOWEL OR 17X26 10 PK STRL BLUE (TOWEL DISPOSABLE) ×3 IMPLANT
TUBE CONNECTING 12'X1/4 (SUCTIONS) ×1
TUBE CONNECTING 12X1/4 (SUCTIONS) ×2 IMPLANT
UNDERPAD 30X30 (UNDERPADS AND DIAPERS) ×3 IMPLANT
WATER STERILE IRR 1000ML POUR (IV SOLUTION) ×3 IMPLANT

## 2018-06-05 NOTE — Anesthesia Postprocedure Evaluation (Signed)
Anesthesia Post Note  Patient: Charles Chambers  Procedure(s) Performed: RIGHT THUMB REVISION AMPUTATION (Right Thumb)     Patient location during evaluation: PACU Anesthesia Type: MAC Level of consciousness: awake Pain management: pain level controlled Vital Signs Assessment: post-procedure vital signs reviewed and stable Respiratory status: spontaneous breathing Cardiovascular status: stable Postop Assessment: no apparent nausea or vomiting Anesthetic complications: no    Last Vitals:  Vitals:   06/05/18 0607 06/05/18 0854  BP: 126/86   Pulse: 62   Resp: 20   Temp: 36.4 C 36.4 C  SpO2: 97%     Last Pain:  Vitals:   06/05/18 0854  TempSrc:   PainSc: 0-No pain   Pain Goal: Patients Stated Pain Goal: 5 (06/05/18 0626)               Trayton Szabo JR,JOHN [place] Flow

## 2018-06-05 NOTE — H&P (Signed)
Charles Chambers is an 82 y.o. male.   Chief Complaint:right thumb pain, swelling, and bleeding from recurrent squamous cell carcinoma HPI: patient's a very pleasant 82 year old male with a recurrent right thumb squamous cell carcinoma over the proximal nail fold  Past Medical History:  Diagnosis Date  . Abnormal gait   . Atrial fibrillation ([place])   . CAD (coronary artery disease)   . 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 Chronic back pain   . Complication of anesthesia    supraclavicular block caused trouble breathing and heartrate dropped  . Congestive heart failure with left ventricular diastolic dysfunction, NYHA class 2 ([place])   . Diabetes mellitus    INSULIN DEPENDENT, CONTROLLED Type 2  . Diabetic neuropathy ([place])   . Diverticulosis of colon   . Hx of colonoscopy    10 YEARS AGO  . Hx of nonmelanoma skin cancer   . Hx of osteomyelitis   . Hyperlipidemia   . Hypertension    BENIGN ESSENTIAL  . Lower extremity venous stasis   . MI (myocardial infarction) ([place]) 09/06/1995  . Mild renal insufficiency    decreased kidney function  . Moderate mitral regurgitation   . Morbid obesity with BMI of 40.0-44.9, adult ([place])   . Obstructive sleep apnea on CPAP    does not wear CPAP, wears 2L O2 [place]  . Osteoarthritis, multiple sites   . Osteomyelitis of vertebra of lumbar region ([place])   . Pneumonia   . Status post coronary artery stent placement 09/09/1995  . Vitamin D deficiency     Past Surgical History:  Procedure Laterality Date  . ANGIOPLASTY  08/1995   STENT PLACEMENT  . ARM/ELBOW SURGERY  2016  . BACK SURGERY    . CORONARY STENT PLACEMENT  09/09/1995  . I&D EXTREMITY Right 03/23/2018   Procedure: IRRIGATION AND DEBRIDEMENT WITH INTERPHANGEAL JOINT [place] ROTATION FLAP;  Surgeon: Charlotte Crumb, MD;  Location: [facility];  Service: Orthopedics;  Laterality: Right;  . LAMINECTOMY     OPEN BIOPSY OF L4-S1  . PAROTIDECTOMY Right 06/2016  .  POSTERIOR FUSION FOR OSTEOMYLITIS  11/2008  . REMOVAL OF CYST FROM EYELID  10/2010    Family History  Problem Relation Age of Onset  . Heart disease Mother 89  . Heart disease Father    Social History:  reports that he has quit smoking. He has never used smokeless tobacco. He reports that he does not drink alcohol or use drugs.  Allergies:  Allergies  Allergen Reactions  . Other Other (See Comments)    Narcotics cause depression, especially codeine Contrast products can not be used due to kidney function   . Aleve [Naproxen Sodium] Other (See Comments)    Due to kidney function  . Baclofen Other (See Comments)    HALLUCINATIONS    . Codeine     UNSPECIFIED REACTION   . Mushroom Extract Complex Nausea And Vomiting  . Neurontin [Gabapentin] Other (See Comments)    overstimulation    Medications Prior to Admission  Medication Sig Dispense Refill  . acetaminophen (TYLENOL) 500 MG tablet Take 1,000 mg by mouth every 6 (six) hours as needed for moderate pain.    Marland Kitchen aspirin EC 81 MG tablet Take 81 mg by mouth daily.      Marland Kitchen atorvastatin (LIPITOR) 20 MG tablet Take 20 mg by mouth at bedtime.     . carvedilol (COREG) 25 MG tablet Take 25 mg by mouth 2 (two) times daily  with a meal.     . Cholecalciferol (VITAMIN D3) 3000 UNITS TABS Take 3,000 Units by mouth daily.     . furosemide (LASIX) 20 MG tablet Take 20 mg by mouth every Monday, Wednesday, and Friday.    . hydrochlorothiazide (HYDRODIURIL) 25 MG tablet Take 25 mg by mouth daily.      . insulin lispro protamine-insulin lispro (HUMALOG 75/25) (75-25) 100 UNIT/ML SUSP Inject 17-32 Units into the skin See admin instructions. Inject 32 units SQ in the morning and inject 17 units SQ in the evening    . lisinopril (PRINIVIL,ZESTRIL) 20 MG tablet Take 20 mg by mouth daily.    . nitroGLYCERIN (NITROSTAT) 0.4 MG SL tablet Place 0.4 mg under the tongue every 5 (five) minutes as needed for chest pain.   0  . OXYGEN Inhale 2 L into the lungs  See admin instructions. Use 2 L at bedtime and as needed for shortness of breath    . warfarin (COUMADIN) 5 MG tablet Take 2.5-5 mg by mouth See admin instructions. Take 5 mg by mouth daily on Tuesday, Thursday and Sunday after evening meals. Take 2.5 mg by mouth daily on all other days after evening meals.    . sacubitril-valsartan (ENTRESTO) 24-26 MG Take 1 tablet by mouth 2 (two) times daily. Start this after 36 hours of stopping lisinopril (Patient not taking: Reported on 06/01/2018) 60 tablet 3    Results for orders placed or performed during the hospital encounter of 06/05/18 (from the past 48 hour(s))  CBC     Status: None   Collection Time: 06/05/18  5:45 AM  Result Value Ref Range   WBC 5.8 4.0 - 10.5 K/uL   RBC 4.39 4.22 - 5.81 MIL/uL   Hemoglobin 13.6 13.0 - 17.0 g/dL   HCT 42.1 39.0 - 52.0 %   MCV 95.9 78.0 - 100.0 fL   MCH 31.0 26.0 - 34.0 pg   MCHC 32.3 30.0 - 36.0 g/dL   RDW 14.5 11.5 - 15.5 %   Platelets 165 150 - 400 K/uL    Comment: Performed at [facility] [address], [place], [place] [address]  Basic metabolic panel     Status: Abnormal   Collection Time: 06/05/18  5:45 AM  Result Value Ref Range   Sodium 133 (L) 135 - 145 mmol/L   Potassium 4.4 3.5 - 5.1 mmol/L   Chloride 97 (L) 98 - 111 mmol/L   CO2 27 22 - 32 mmol/L   Glucose, Bld 147 (H) 70 - 99 mg/dL   BUN 30 (H) 8 - 23 mg/dL   Creatinine, Ser 1.21 0.61 - 1.24 mg/dL   Calcium 9.6 8.9 - 10.3 mg/dL   GFR calc non Af Amer 55 (L) >60 mL/min   GFR calc Af Amer >60 >60 mL/min    Comment: (NOTE) The eGFR has been calculated using the CKD EPI equation. This calculation has not been validated in all clinical situations. eGFR's persistently <60 mL/min signify possible Chronic Kidney Disease.    Anion gap 9 5 - 15    Comment: Performed at [facility] [address], [place], [place] [address]  Protime-INR     Status: Abnormal   Collection Time: 06/05/18  5:45 AM  Result Value Ref Range    Prothrombin Time 18.9 (H) 11.4 - 15.2 seconds   INR 1.60     Comment: Performed at [facility] [address], [place], [place] [address]  Glucose,  capillary     Status: Abnormal   Collection Time: 06/05/18  6:05 AM  Result Value Ref Range   Glucose-Capillary 141 (H) 70 - 99 mg/dL   No results found.  Review of Systems  All other systems reviewed and are negative.   Blood pressure 126/86, pulse 62, temperature 97.6 F (36.4 C), temperature source Oral, resp. rate 20, height _0  (1.753 m), weight 122 kg, SpO2 97 %. Physical Exam  Constitutional: He appears well-developed and well-nourished.  HENT:  Head: Normocephalic and atraumatic.  Neck: Normal range of motion.  Cardiovascular: Normal rate.  Respiratory: Effort normal.  Musculoskeletal:       Right hand: He exhibits bony tenderness, deformity and swelling.  Thumb recurrent squamous cell carcinoma dorsally at proximal nail fold right side  Neurological: He is alert.  Skin: Skin is warm. There is erythema.  Psychiatric: He has a normal mood and affect. His behavior is normal. Judgment and thought content normal.     Assessment/Plan 82 year old male with recurrent right thumb squamous cell carcinoma. Have discussed with the patient and his daughter. Revision amputation with volar advancement flap under straight local anesthetic. They understand the risks and benefits and wished to proceed.  Schuyler Amor, MD 06/05/2018, 7:30 AM

## 2018-06-05 NOTE — Transfer of Care (Signed)
Immediate Anesthesia Transfer of Care Note  Patient: Charles Chambers  Procedure(s) Performed: RIGHT THUMB REVISION AMPUTATION (Right Thumb)  Patient Location: PACU  Anesthesia Type:MAC  Level of Consciousness: awake, alert  and oriented  Airway & Oxygen Therapy: Patient Spontanous Breathing and Patient connected to nasal cannula oxygen  Post-op Assessment: Report given to RN and Post -op Vital signs reviewed and stable  Post vital signs: Reviewed and stable  Last Vitals:  Vitals Value Taken Time  BP    Temp    Pulse 105 06/05/2018  8:59 AM  Resp 16 06/05/2018  8:59 AM  SpO2 98 % 06/05/2018  8:59 AM  Vitals shown include unvalidated device data.  Last Pain:  Vitals:   06/05/18 0626  TempSrc:   PainSc: 8       Patients Stated Pain Goal: 5 (71/24/58 0998)  Complications: No apparent anesthesia complications

## 2018-06-05 NOTE — Op Note (Signed)
Please see dictated report [phone]

## 2018-06-05 NOTE — Anesthesia Procedure Notes (Signed)
Date/Time: 06/05/2018 8:02 AM Performed by: Imagene Riches, CRNA Pre-anesthesia Checklist: Patient identified, Emergency Drugs available, Suction available and Patient being monitored Patient Re-evaluated:Patient Re-evaluated prior to induction Oxygen Delivery Method: Nasal cannula

## 2018-06-05 NOTE — Op Note (Signed)
[name], [name] MEDICAL RECORD PI:[id number] ACCOUNT [id number] DATE OF BIRTH:09-29-1936 FACILITY: [facility] LOCATION: [facility] PHYSICIAN:Eldar Robitaille A. Burney Gauze, MD  OPERATIVE REPORT  DATE OF PROCEDURE:  06/05/2018  PREOPERATIVE DIAGNOSIS:  Right thumb recurrent squamous cell carcinoma.  POSTOPERATIVE DIAGNOSIS:  Right thumb recurrent squamous cell carcinoma.  PROCEDURE:  Excision of recurrent right thumb squamous cell carcinoma with the distal interphalangeal joint disarticulation and volar advancement flap.  SURGEON:  Charlotte Crumb, MD  ASSISTANT:  Dr. Erskine Squibb.   ANESTHESIA:  Local.  No tourniquet.  COMPLICATIONS:  None.  DRAINS:  None.  SPECIMENS:  One specimen sent.  PROCEDURE IN DETAIL:  Twenty-five minutes prior to being taken to the operating suite, I injected 15 mL of 1% lidocaine plain with epinephrine 1:100,000 and bicarbonate solution around the dorsal and palmar aspects of the right thumb.  After 20 minutes,  the patient was taken to the operating suite and prepped and draped in the usual sterile fashion.  Once this was done, a 15 blade was used to excise the recurrent right thumb squamous cell carcinoma involving the proximal nail fold and radial nail fold.   Once the entire squamous cell was excised, we dissected down to the interphalangeal joint.  We carefully excised the distal phalanx from the volar flap and identified the neurovascular bundles and performed bilateral neurectomies.  We cauterized the  vessels.  We then carefully removed the distal aspect of the head of the proximal phalanx with a rongeur down to a smooth surface.  After this was done, we advanced the volar flap over the dorsal aspect and sutured with 4-0 Monocryl suture.  The wound  looked good at the end of the procedure with no undue skin tension.  The flap was viable.  We then dressed with Xeroform, 4 x 4's, and a compression wrap as well as an Ace bandage.  The patient tolerated this  procedure well and went to recovery room in  stable fashion.  LN/NUANCE  D:06/05/2018 T:06/05/2018 JOB:[id number]

## 2018-06-08 ENCOUNTER — Encounter (HOSPITAL_COMMUNITY): Payer: Self-pay | Admitting: Orthopedic Surgery

## 2018-06-26 ENCOUNTER — Ambulatory Visit (INDEPENDENT_AMBULATORY_CARE_PROVIDER_SITE_OTHER): Payer: Medicare Other | Admitting: Internal Medicine

## 2018-06-26 ENCOUNTER — Encounter: Payer: Self-pay | Admitting: Internal Medicine

## 2018-06-26 ENCOUNTER — Encounter

## 2018-06-26 VITALS — BP 102/64 | HR 71 | Ht 69.0 in | Wt 262.0 lb

## 2018-06-26 DIAGNOSIS — I482 Chronic atrial fibrillation: Secondary | ICD-10-CM

## 2018-06-26 DIAGNOSIS — I519 Heart disease, unspecified: Secondary | ICD-10-CM

## 2018-06-26 DIAGNOSIS — I10 Essential (primary) hypertension: Secondary | ICD-10-CM | POA: Diagnosis not present

## 2018-06-26 DIAGNOSIS — G4733 Obstructive sleep apnea (adult) (pediatric): Secondary | ICD-10-CM | POA: Diagnosis not present

## 2018-06-26 DIAGNOSIS — I255 Ischemic cardiomyopathy: Secondary | ICD-10-CM | POA: Diagnosis not present

## 2018-06-26 DIAGNOSIS — I4821 Permanent atrial fibrillation: Secondary | ICD-10-CM

## 2018-06-26 NOTE — Progress Notes (Signed)
PCP: Earney Mallet, MD Primary Cardiologist: Dr Darral Dash Primary EP: Dr Rayann Heman  Charles Chambers is a 82 y.o. male who presents today for routine electrophysiology followup.  Since last being seen in our clinic, the patient reports doing reasonably well.  Volume is improved.  He did have low bp with entresto and this was discontinued.  His primary concern is with his R thumb.  He has had squamous cancer amputated.  SOB and edema are stable.  Today, he denies symptoms of palpitations, chest pain, dizziness, presyncope, or syncope.  The patient is otherwise without complaint today.   Past Medical History:  Diagnosis Date  . Abnormal gait   . Atrial fibrillation ([place])   . CAD (coronary artery disease)   . 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 Chronic back pain   . Complication of anesthesia    supraclavicular block caused trouble breathing and heartrate dropped  . Congestive heart failure with left ventricular diastolic dysfunction, NYHA class 2 ([place])   . Diabetes mellitus    INSULIN DEPENDENT, CONTROLLED Type 2  . Diabetic neuropathy ([place])   . Diverticulosis of colon   . Hx of colonoscopy    10 YEARS AGO  . Hx of nonmelanoma skin cancer   . Hx of osteomyelitis   . Hyperlipidemia   . Hypertension    BENIGN ESSENTIAL  . Lower extremity venous stasis   . MI (myocardial infarction) ([place]) 09/06/1995  . Mild renal insufficiency    decreased kidney function  . Moderate mitral regurgitation   . Morbid obesity with BMI of 40.0-44.9, adult ([place])   . Obstructive sleep apnea on CPAP    does not wear CPAP, wears 2L O2 [place]  . Osteoarthritis, multiple sites   . Osteomyelitis of vertebra of lumbar region ([place])   . Pneumonia   . Status post coronary artery stent placement 09/09/1995  . Vitamin D deficiency    Past Surgical History:  Procedure Laterality Date  . AMPUTATION Right 06/05/2018   Procedure: RIGHT THUMB REVISION AMPUTATION;  Surgeon: Charlotte Crumb,  MD;  Location: [facility];  Service: Orthopedics;  Laterality: Right;  . ANGIOPLASTY  08/1995   STENT PLACEMENT  . ARM/ELBOW SURGERY  2016  . BACK SURGERY    . CORONARY STENT PLACEMENT  09/09/1995  . I&D EXTREMITY Right 03/23/2018   Procedure: IRRIGATION AND DEBRIDEMENT WITH INTERPHANGEAL JOINT [place] ROTATION FLAP;  Surgeon: Charlotte Crumb, MD;  Location: [facility];  Service: Orthopedics;  Laterality: Right;  . LAMINECTOMY     OPEN BIOPSY OF L4-S1  . PAROTIDECTOMY Right 06/2016  . POSTERIOR FUSION FOR OSTEOMYLITIS  11/2008  . REMOVAL OF CYST FROM EYELID  10/2010    ROS- all systems are reviewed and negatives except as per HPI above  Current Outpatient Medications  Medication Sig Dispense Refill  . acetaminophen (TYLENOL) 500 MG tablet Take 1,000 mg by mouth every 6 (six) hours as needed for moderate pain.    Marland Kitchen aspirin EC 81 MG tablet Take 81 mg by mouth daily.      Marland Kitchen atorvastatin (LIPITOR) 20 MG tablet Take 20 mg by mouth at bedtime.     . carvedilol (COREG) 25 MG tablet Take 25 mg by mouth 2 (two) times daily with a meal.     . Cholecalciferol (VITAMIN D3) 3000 UNITS TABS Take 3,000 Units by mouth daily.     . furosemide (LASIX) 20 MG tablet Take 20 mg by mouth every Monday, Wednesday, and Friday.    Marland Kitchen  hydrochlorothiazide (HYDRODIURIL) 25 MG tablet Take 25 mg by mouth daily.      . insulin lispro protamine-insulin lispro (HUMALOG 75/25) (75-25) 100 UNIT/ML SUSP Inject 17-32 Units into the skin See admin instructions. Inject 32 units SQ in the morning and inject 17 units SQ in the evening    . lisinopril (PRINIVIL,ZESTRIL) 20 MG tablet Take 20 mg by mouth daily.    . nitroGLYCERIN (NITROSTAT) 0.4 MG SL tablet Place 0.4 mg under the tongue every 5 (five) minutes as needed for chest pain.   0  . OXYGEN Inhale 2 L into the lungs See admin instructions. Use 2 L at bedtime and as needed for shortness of breath    . warfarin (COUMADIN) 5 MG tablet Take 2.5-5 mg by mouth See admin instructions.  Take 5 mg by mouth daily on Tuesday, Thursday and Sunday after evening meals. Take 2.5 mg by mouth daily on all other days after evening meals.     No current facility-administered medications for this visit.     Physical Exam: Vitals:   06/26/18 1154  BP: 102/64  Pulse: 71  SpO2: 95%  Weight: 262 lb (118.8 kg)  Height: 5\' 9"  (1.753 m)    GEN- The patient is elderly and frail appearing, alert and oriented x 3 today.   Head- normocephalic, atraumatic Eyes-  Sclera clear, conjunctiva pink Ears- hearing intact Oropharynx- clear Lungs- Clear to ausculation bilaterally, normal work of breathing Heart- Regular rate and rhythm, no murmurs, rubs or gallops, PMI not laterally displaced GI- soft, NT, ND, + BS Extremities- no clubbing, cyanosis, + dependant edema  Wt Readings from Last 3 Encounters:  06/26/18 262 lb (118.8 kg)  06/05/18 269 lb (122 kg)  04/03/18 265 lb 9.6 oz (120.5 kg)    EKG tracing ordered today is personally reviewed and shows afib, LBBB  Assessment and Plan:  1. Chronic systolic dysfunction/ ischemic CM/ CAD euvolemic No ischemic symptoms No changes Conservative measures are advised going forward Did not tolerate entresto due to hypotension  2. OSA Compliance with CPAP encouraged  3. Permanent afib Rate controlled On coumadin  4. HTN Stable No change required today  5. Obesity Body mass index is 38.69 kg/m. Wt Readings from Last 3 Encounters:  06/26/18 262 lb (118.8 kg)  06/05/18 269 lb (122 kg)  04/03/18 265 lb 9.6 oz (120.5 kg)   Follow-up with Dr Darral Dash Return to see me in 6 months  Thompson Grayer MD, [facility] 06/26/2018 12:19 PM

## 2018-06-26 NOTE — Patient Instructions (Signed)

## 2018-08-19 ENCOUNTER — Telehealth: Payer: Self-pay | Admitting: *Deleted

## 2018-08-19 NOTE — Telephone Encounter (Signed)
Received referral from Dr. 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 Scheduled for 08/26/18 at 10:15 with Ned Card, NP per Dr. Benay Spice. Attempted to reach patient with appointment. Left VM to call office and ask for Dr. Gearldine Shown nurse.

## 2018-08-21 ENCOUNTER — Telehealth: Payer: Self-pay | Admitting: *Deleted

## 2018-08-21 NOTE — Telephone Encounter (Signed)
Spoke with daughter Leota Jacobsen, and gave her new appt for pt on  08/26/18.   Katharine Look voiced understanding.

## 2018-08-26 ENCOUNTER — Inpatient Hospital Stay: Payer: Medicare Other | Attending: Nurse Practitioner | Admitting: Nurse Practitioner

## 2018-08-26 ENCOUNTER — Telehealth: Payer: Self-pay

## 2018-08-26 ENCOUNTER — Encounter: Payer: Self-pay | Admitting: Nurse Practitioner

## 2018-08-26 VITALS — BP 113/75 | HR 68 | Temp 98.0°F | Resp 15 | Ht 68.5 in | Wt 256.8 lb

## 2018-08-26 DIAGNOSIS — I4891 Unspecified atrial fibrillation: Secondary | ICD-10-CM

## 2018-08-26 DIAGNOSIS — Z85858 Personal history of malignant neoplasm of other endocrine glands: Secondary | ICD-10-CM | POA: Diagnosis not present

## 2018-08-26 DIAGNOSIS — E119 Type 2 diabetes mellitus without complications: Secondary | ICD-10-CM

## 2018-08-26 DIAGNOSIS — Z923 Personal history of irradiation: Secondary | ICD-10-CM | POA: Diagnosis not present

## 2018-08-26 DIAGNOSIS — I10 Essential (primary) hypertension: Secondary | ICD-10-CM

## 2018-08-26 DIAGNOSIS — Z7901 Long term (current) use of anticoagulants: Secondary | ICD-10-CM

## 2018-08-26 DIAGNOSIS — C44622 Squamous cell carcinoma of skin of right upper limb, including shoulder: Secondary | ICD-10-CM

## 2018-08-26 NOTE — Progress Notes (Addendum)
New Hematology/Oncology Consult   Requesting MD: Dr. Burney Gauze  [phone]      Reason for Consult: Recurrent squamous cell carcinoma right thumb  HPI: Mr. Spradley is a 82 year old man referred by Dr. 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  He discontinued radiation after approximately 12 treatments due to side effects.  30 treatments were planned initially.  He also has a history of atrial fibrillation.  He is on Coumadin managed by his PCP.  He has CHF, hypertension, chronic renal failure and diabetes.   His daughter reports he was diagnosed with a mucoid cyst of the right thumb December 2017.  On 03/23/2018 he underwent incision and drainage, interphalangeal joint debridement and rotational flap coverage with mucoid cyst excision right thumb.  Pathology showed squamous cell carcinoma.  On 06/05/2018 he underwent excision of recurrent right thumb squamous cell carcinoma with distal interphalangeal joint disarticulation and volar advancement flap.  Pathology showed invasive keratinizing squamous cell carcinoma, moderately differentiated, spanning 4.6 cm.  Carcinoma was broadly present at the resection margin of the larger piece of tissue.  There was carcinoma involvement of bone.  At the time of an office visit with Dr. Burney Gauze on 08/17/2018 he was noted to have skin breakdown along the radial side of his thumb.  This was felt to be either an acute wound issue or a recurrence of the squamous cell carcinoma.  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     Past Medical History:  Diagnosis Date  . Abnormal gait   . Atrial fibrillation ([place])   . CAD (coronary  artery disease)   . 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 Chronic back pain   . Complication of anesthesia    supraclavicular block caused trouble breathing and heartrate dropped  . Congestive heart failure with left ventricular diastolic dysfunction, NYHA class 2 ([place])   . Diabetes mellitus    INSULIN DEPENDENT, CONTROLLED Type 2  . Diabetic neuropathy ([place])   . Diverticulosis of colon   . Hx of colonoscopy    10 YEARS AGO  . Hx of nonmelanoma skin cancer   . Hx of osteomyelitis   . Hyperlipidemia   . Hypertension    BENIGN ESSENTIAL  . Lower extremity venous stasis   . MI (myocardial infarction) ([place]) 09/06/1995  . Mild renal insufficiency    decreased kidney function  . Moderate mitral regurgitation   . Morbid obesity with BMI of 40.0-44.9, adult ([place])   . Obstructive sleep apnea on CPAP    does not wear CPAP, wears 2L O2 [place]  . Osteoarthritis, multiple sites   . Osteomyelitis of vertebra of lumbar region ([place])   . Pneumonia   . Status post coronary artery stent placement 09/09/1995  . Vitamin D deficiency   :   Past Surgical History:  Procedure Laterality Date  . AMPUTATION Right 06/05/2018   Procedure: RIGHT THUMB REVISION AMPUTATION;  Surgeon: Charlotte Crumb, MD;  Location: [facility];  Service: Orthopedics;  Laterality: Right;  . ANGIOPLASTY  08/1995   STENT PLACEMENT  . ARM/ELBOW SURGERY  2016  . BACK SURGERY    . CORONARY STENT PLACEMENT  09/09/1995  . I&D EXTREMITY Right 03/23/2018  Procedure: IRRIGATION AND DEBRIDEMENT WITH INTERPHANGEAL JOINT [place] ROTATION FLAP;  Surgeon: Charlotte Crumb, MD;  Location: [facility];  Service: Orthopedics;  Laterality: Right;  . LAMINECTOMY     OPEN BIOPSY OF L4-S1  . PAROTIDECTOMY Right 06/2016  . POSTERIOR FUSION FOR OSTEOMYLITIS  11/2008  . REMOVAL OF CYST FROM EYELID  10/2010  :   Current Outpatient Medications:  .  acetaminophen (TYLENOL) 500 MG tablet, Take 1,000 mg by mouth every 6 (six)  hours as needed for moderate pain., Disp: , Rfl:  .  aspirin EC 81 MG tablet, Take 81 mg by mouth daily.  , Disp: , Rfl:  .  atorvastatin (LIPITOR) 20 MG tablet, Take 20 mg by mouth at bedtime. , Disp: , Rfl:  .  carvedilol (COREG) 25 MG tablet, Take 25 mg by mouth 2 (two) times daily with a meal. , Disp: , Rfl:  .  Cholecalciferol (VITAMIN D3) 3000 UNITS TABS, Take 3,000 Units by mouth daily. , Disp: , Rfl:  .  furosemide (LASIX) 20 MG tablet, Take 20 mg by mouth every Monday, Wednesday, and Friday., Disp: , Rfl:  .  hydrochlorothiazide (HYDRODIURIL) 25 MG tablet, Take 25 mg by mouth daily.  , Disp: , Rfl:  .  insulin lispro protamine-insulin lispro (HUMALOG 75/25) (75-25) 100 UNIT/ML SUSP, Inject 17-32 Units into the skin See admin instructions. Inject 32 units SQ in the morning and inject 17 units SQ in the evening, Disp: , Rfl:  .  lisinopril (PRINIVIL,ZESTRIL) 20 MG tablet, Take 20 mg by mouth daily., Disp: , Rfl:  .  nitroGLYCERIN (NITROSTAT) 0.4 MG SL tablet, Place 0.4 mg under the tongue every 5 (five) minutes as needed for chest pain. , Disp: , Rfl: 0 .  OXYGEN, Inhale 2 L into the lungs See admin instructions. Use 2 L at bedtime and as needed for shortness of breath, Disp: , Rfl:  .  warfarin (COUMADIN) 5 MG tablet, Take 2.5-5 mg by mouth See admin instructions. Take 5 mg by mouth daily on Tuesday, Thursday and Sunday after evening meals. Take 2.5 mg by mouth daily on all other days after evening meals., Disp: , Rfl: :  :   Allergies  Allergen Reactions  . Other Other (See Comments)    Narcotics cause depression, especially codeine Contrast products can not be used due to kidney function   . Aleve [Naproxen Sodium] Other (See Comments)    Due to kidney function  . Baclofen Other (See Comments)    HALLUCINATIONS    . Codeine     UNSPECIFIED REACTION   . Mushroom Extract Complex Nausea And Vomiting  . Neurontin [Gabapentin] Other (See Comments)    overstimulation  :  FH: He  had a sister with breast cancer.  No other family history of malignancy.  SOCIAL HISTORY: He lives in [place].  He lives alone.  He is divorced.  His daughter and son-in-law see him on a daily basis and live fairly close to him.  [place]  He is retired from the [employer].  He quit smoking in 1970.  No alcohol use.  Review of Systems: No anorexia or weight loss.  No fevers or sweats.  He has bleeding associated with the right thumb wound.  No unusual headaches or vision change.  He has stable dyspnea on exertion.  No cough.  No dysphagia.  No change in bowel habits.  He denies nausea/vomiting.  No urinary  symptoms.  His mobility has been poor since undergoing back surgery several years ago.   Physical Exam:  Blood pressure 113/75, pulse 68, temperature 98 F (36.7 C), temperature source Oral, resp. rate 15, height 5' 8.5" (1.74 m), weight 256 lb 12.8 oz (116.5 kg), SpO2 99 %.  HEENT: Pupils are small, equal.  Extraocular movements intact.  Sclerae anicteric.  Oropharynx is without thrush or ulceration.  Lungs: Distant breath sounds.  No respiratory distress. Cardiac: Regular rate and rhythm. Abdomen: Soft and nontender.  No hepatosplenomegaly. Vascular: Trace edema below the knees bilaterally.  Chronic stasis change bilaterally. Lymph nodes: No palpable cervical, supraclavicular, axillary lymph nodes or inguinal lymph nodes. Neurologic: Alert and oriented. Skin: Skin over the forearms appears sun damaged.  Scattered ecchymoses. Musculoskeletal: Status post distal right thumb amputation.  Volar aspectwith a 2 cm firm nodular fungating lesion extending close to the MCP joint.  LABS:  None  RADIOLOGY:  No results found.  Assessment:   1. 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   Pathology showed squamous cell carcinoma; positive for cytokeratin 5/6 and p63.    06/05/2018 status post excision of recurrent right thumb squamous cell carcinoma with distal interphalangeal joint disarticulation and volar advancement flap.  Pathology showed invasive keratinizing squamous cell carcinoma, moderately differentiated, spanning 4.6 cm.  Carcinoma was broadly present at the resection margin of the larger piece of tissue.  There was carcinoma involvement of bone. 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  Pathology showed squamous cell carcinoma 3.5 cm, intermediate grade, 0.2 cm from the nearest inked margin; perineural invasion present; lymphovascular invasion not observed; 4 lymph nodes uninvolved with tumor.  Cannot completely rule out a metastatic lesion. 3. History of multiple squamous cell skin cancers 4. Atrial fibrillation 5. Coumadin anticoagulation secondary to #4 6. Hypertension 7. Diabetes mellitus 8. Chronic renal failure 9. CHF 10. History of osteomyelitis of the spine  Plan: Mr. 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  Dr. Benay Spice reviewed the diagnosis and treatment options with Mr. Malkiewicz and his family at today's visit.  The treatment options include another amputation procedure, radiation, systemic therapy.  Given his multiple other health conditions it was decided to refer him to radiation oncology in [place] to consider a course of radiation therapy.  Mr. Zingaro and his family are in agreement with this plan.  Dr. Benay Spice has spoken with the radiation oncologist.  They will contact Mr. Sparks with an appointment in the near future.  Dr. Benay Spice recommends a noncontrast CT scan of the neck and chest for staging purposes.  This will be  arranged for by radiation oncology.  Mr. 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  As noted above he will undergo staging studies in [place].  He will return for a follow-up visit here in 4 months but understands he can contact the office for a sooner appointment if needed.  Patient seen with Dr. Benay Spice.  60 minutes were spent face-to-face at today's visit with the majority of that time involved in counseling/coordination of care.   Ned Card, NP 08/26/2018, 11:22 AM  This was a shared visit with Ned Card.  Mr. Bautch was interviewed and examined.  He has a history of squamous cell carcinoma of the right first digit.  He also has history of squamous cell carcinoma involving the right parotid.  The parotid lesion may have been a metastasis from the thumb.  He now has medical evidence of local disease progression at the distal right thumb.  There is no clinical evidence of distant metastatic disease.  We discussed treatment options with Mr. Gittleman and his family.  I recommend radiation for control of tumor at the thumb, reserving radiation for progression beyond radiation.  I contacted the radiation oncology service in [place].  He will be seen there for consideration of radiation.  A CT of the neck and chest will be scheduled in [place].  He will return for an office visit here in 4 months.  I am available to see him sooner as needed.  We discussed systemic treatment options including chemotherapy and immunotherapy.  I think it would be difficult for Mr. Iversen to tolerate systemic therapy.  Julieanne Manson, MD

## 2018-08-26 NOTE — Telephone Encounter (Signed)
Printed avs and calender of upcoming appointment. Per 1/16 los 

## 2018-12-22 ENCOUNTER — Other Ambulatory Visit: Payer: Self-pay

## 2018-12-22 ENCOUNTER — Inpatient Hospital Stay: Payer: Medicare Other | Attending: Oncology | Admitting: Oncology

## 2018-12-22 ENCOUNTER — Telehealth: Payer: Self-pay | Admitting: Oncology

## 2018-12-22 VITALS — BP 129/82 | HR 78 | Temp 97.8°F | Resp 17 | Ht 68.5 in | Wt 271.7 lb

## 2018-12-22 DIAGNOSIS — E1122 Type 2 diabetes mellitus with diabetic chronic kidney disease: Secondary | ICD-10-CM | POA: Insufficient documentation

## 2018-12-22 DIAGNOSIS — N189 Chronic kidney disease, unspecified: Secondary | ICD-10-CM

## 2018-12-22 DIAGNOSIS — Z85828 Personal history of other malignant neoplasm of skin: Secondary | ICD-10-CM | POA: Diagnosis present

## 2018-12-22 DIAGNOSIS — I130 Hypertensive heart and chronic kidney disease with heart failure and stage 1 through stage 4 chronic kidney disease, or unspecified chronic kidney disease: Secondary | ICD-10-CM | POA: Insufficient documentation

## 2018-12-22 DIAGNOSIS — Z7901 Long term (current) use of anticoagulants: Secondary | ICD-10-CM | POA: Diagnosis not present

## 2018-12-22 DIAGNOSIS — I4891 Unspecified atrial fibrillation: Secondary | ICD-10-CM | POA: Insufficient documentation

## 2018-12-22 DIAGNOSIS — C44622 Squamous cell carcinoma of skin of right upper limb, including shoulder: Secondary | ICD-10-CM

## 2018-12-22 NOTE — Progress Notes (Signed)
Saw his hand surgeon, Dr. Burney Gauze today and will return on 01/12/19. Finished RT with Dr. Rhunette Croft in [place], [place] on 10/09/18 (16 treatments) and returns on 01/14/19.

## 2018-12-22 NOTE — Progress Notes (Signed)
[facility] OFFICE PROGRESS NOTE   Diagnosis: Squamous cell carcinoma of the thumb  INTERVAL HISTORY:   Charles Chambers completed radiation to the right thumb from 09/16/2016-10/01/2016 for 2400 cGy in 15 fractions. The palpable tumor at the right thumb has resolved following the course of radiation.  He developed ulceration/necrosis of the distal right thumb.  This is slowly healing.  The more anterior and volar aspect of the thumb are healing. He saw Dr. Burney Gauze today.  Warm soapy soaks to the right thumb are recommended.  But reports mild discomfort at the distal right thumb.  He had a squamous cell carcinoma removed from the right ear recently.    Objective:  Vital signs in last 24 hours:  Blood pressure 129/82, pulse 78, temperature 97.8 F (36.6 C), temperature source Oral, resp. rate 17, height 5' 8.5" (1.74 m), weight 271 lb 11.2 oz (123.2 kg), SpO2 94 %.    HEENT: Neck without mass, healing surgical site at the right external ear Lymphatics: No cervical, supraclavicular, or axillary nodes Resp: Lungs clear bilaterally Cardio: Distant heart sounds, regular rate and rhythm GI: No hepatomegaly Vascular: Chronic stasis change the lower leg bilaterally  Skin: Eschar overlying the distal right thumb with mild serous drainage.  No palpable mass.    Lab Results:  Lab Results  Component Value Date   WBC 5.8 06/05/2018   HGB 13.6 06/05/2018   HCT 42.1 06/05/2018   MCV 95.9 06/05/2018   PLT 165 06/05/2018    CMP  Lab Results  Component Value Date   NA 133 (L) 06/05/2018   K 4.4 06/05/2018   CL 97 (L) 06/05/2018   CO2 27 06/05/2018   GLUCOSE 147 (H) 06/05/2018   BUN 30 (H) 06/05/2018   CREATININE 1.21 06/05/2018   CALCIUM 9.6 06/05/2018   GFRNONAA 55 (L) 06/05/2018   GFRAA >60 06/05/2018     Medications: I have reviewed the patient's current medications.   Assessment/Plan: 1. 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  Pathology showed squamous cell carcinoma; positive for cytokeratin 5/6 and p63.    06/05/2018 status post excision of recurrent right thumb squamous cell carcinoma with distal interphalangeal joint disarticulation and volar advancement flap.  Pathology showed invasive keratinizing squamous cell carcinoma, moderately differentiated, spanning 4.6 cm.  Carcinoma was broadly present at the resection margin of the larger piece of tissue.  There was carcinoma involvement of bone.  Radiation to the right thumb 09/16/2016 through 10/01/2016, 2400 cGy 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  Pathology showed squamous cell carcinoma 3.5 cm, intermediate grade, 0.2 cm from the nearest inked margin; perineural invasion present; lymphovascular invasion not observed; 4 lymph nodes uninvolved with tumor.  Cannot completely rule out a metastatic lesion. 3. History of multiple squamous cell skin cancers 4. Atrial fibrillation 5. Coumadin anticoagulation secondary to #4 6. Hypertension 7. Diabetes mellitus 8. Chronic renal failure 9. CHF 10. History of osteomyelitis of the spine    Disposition: Charles Chambers appears stable.  He underwent radiation to the persistent tumor at the distal right thumb.  The skin and soft tissue at the distal thumb continues to heal.  There is no obvious remaining tumor today.  He will continue close clinical follow-up with Dr. Burney Gauze and Dr.  Quaranta.  He will return for an office visit here in 4 months.  I will see him sooner if there is concern for recurrent tumor.  We will discuss systemic treatment options if he develops progressive disease.  Betsy Coder, MD  12/22/2018  4:39 PM

## 2018-12-22 NOTE — Telephone Encounter (Signed)
Gave avs and calendar ° °

## 2019-01-22 ENCOUNTER — Ambulatory Visit: Payer: Medicare Other | Admitting: Internal Medicine

## 2019-02-22 ENCOUNTER — Telehealth: Payer: Self-pay | Admitting: *Deleted

## 2019-02-22 ENCOUNTER — Telehealth: Payer: Self-pay | Admitting: Nurse Practitioner

## 2019-02-22 ENCOUNTER — Other Ambulatory Visit: Payer: Self-pay | Admitting: Nurse Practitioner

## 2019-02-22 DIAGNOSIS — C44622 Squamous cell carcinoma of skin of right upper limb, including shoulder: Secondary | ICD-10-CM

## 2019-02-22 NOTE — Telephone Encounter (Signed)
I spoke with Ms. Hutchens regarding the earlier message about her father.  I offered an appointment to see myself and Dr. Benay Spice.  He does not want to come to the office without his daughter. He would like to see Dr. Amedeo Plenty regarding the issues he is currently having with the thumb.  I made a referral.

## 2019-02-22 NOTE — Telephone Encounter (Addendum)
"  Informed a referral from Dr. Benay Spice for Dr.William Gramig a hand surgeon in [place] would facilitate him being seen.  My call to office was returned today.  We love and have nothing but good things to say about Dr. Burney Gauze.  Drainage forms a scab that falls off and opening gets bigger.  Swelling to the stub is down today; redness not as intense but I know he'll need another surgery.  I feel so helpless as an only child not sure we're up against loosing his hand or his life.  He says he doesn't want to go alone without me or he won't come out."

## 2019-02-22 NOTE — Telephone Encounter (Signed)
"  Charles Chambers [phone]). Dad's bone cancer is eating through skin of right thumb.  Ask Dr. Benay Spice what to do for my dad and his finger.  Wait or go to the nearest [place] facility, [facility].  Live in [place].  I work; could not arrive to [place] until late this afternoon.     Almost called last Thursday when I saw area to thumb opening up.    Hole the size of a dime is draining with a decomposing odor.    New blister on other side of thumb.    No fever.    Area painful to touch.  Continue warm soapy water soaks per surgeon Dr. Burney Gauze.  Silvadene cream used in the past.    He is diabetic with CHF but has heal well from both amputation surgeries to right thumb; most recent was in August."

## 2019-02-26 ENCOUNTER — Encounter: Payer: Self-pay | Admitting: Nurse Practitioner

## 2019-03-01 ENCOUNTER — Telehealth: Payer: Self-pay

## 2019-03-01 ENCOUNTER — Telehealth: Payer: Self-pay | Admitting: Nurse Practitioner

## 2019-03-01 NOTE — Telephone Encounter (Signed)
Received schedule message today re status on referral to [facility].   Referral sent to [facility] as urgent last week. Spoke with Candy at [facility] and per Freida Busman they are currently waiting on records from Dr. Revonda Standard who performed prior surgery so that Drs. Gramig can review. Update sent to LT and her desk nurse who sent 5/11 schedule message.

## 2019-03-01 NOTE — Telephone Encounter (Signed)
Returned daughters call in regards to a referral for Dr. Vanetta Shawl office. Advised that a referral was done by Ned Card NP. Also advised that I reached out to scheduling sending a high priority message in regards to making the appointment. Daughter verbalized understanding.

## 2019-03-17 ENCOUNTER — Other Ambulatory Visit (HOSPITAL_COMMUNITY)
Admission: RE | Admit: 2019-03-17 | Discharge: 2019-03-17 | Disposition: A | Discharge status: Home / Self Care | Payer: Medicare Other | Source: Ambulatory Visit | Attending: Orthopedic Surgery | Admitting: Orthopedic Surgery

## 2019-03-17 DIAGNOSIS — Z1159 Encounter for screening for other viral diseases: Secondary | ICD-10-CM | POA: Insufficient documentation

## 2019-03-18 LAB — NOVEL CORONAVIRUS, NAA (HOSP ORDER, SEND-OUT TO REF LAB; TAT 18-24 HRS): SARS-CoV-2, NAA: NOT DETECTED

## 2019-03-18 NOTE — Progress Notes (Signed)
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 Most recently he is s/p excision of recurrent right thumb SCC with distal interphalangeal joint disarticulation and volar advancement flap on 06/05/18.   Case is posted for Choice anesthesia. (Of note, his 06/05/18 surgery was performed under local anesthesia due to patient/family concerns related to reported SOB, hoarseness, and BP changes that developed with supraclavicular block on 03/23/18.  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  Preoperative COVID test negative on 03/17/19.  - PAT phone RN still attempting to reach patient and will inquire about ASA and warfarin perioperative instructions. She will contact Dr. Vanetta Shawl office if patient has any questions regarding his instructions or notify anesthesiologist or APP if specific questions about anesthesia--otherwise anesthesiologist will evaluate on the day of surgery.    PROVIDERS: Earney Mallet, MD is  PCP - Thompson Grayer, MD is cardiologist. Last visit 06/26/18. Entresto had been discontinued due to hypotension. Volume status was improved. Afib rate controlled. No ischemic symptoms. "Conservative measures are advised going forward." No changes made. (He was previously followed by Raechel Chute, MD in [place], [place].) - Betsy Coder, MD is HEM-[facility] - Gwendlyn Deutscher, MD is RAD-ON[facility])   LABS: He is for labs on the day of surgery. As of 06/05/18, Cr 1.21, glucose 147, CBC WNL.   EKG: 06/26/18 ([facility]): Afib at 64 bpm. Left BBB.  CV: Echo 05/15/17 ([facility]): 1. Moderate ischemic cardiomyopathy and two-vessel territories (LAD and RCA). Estimated EF 35-40%limited by lack of contrast and poor endocardial resolution. 2. Normal RV function with normal RAP. 3. Structurally and functionally grossly normal cardiac valves. Mild central MR. Trace AI.   Past Medical History:  Diagnosis Date  . Abnormal gait   . Atrial fibrillation ([place])   . CAD (coronary artery disease)   . 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 Chronic back pain   . Complication of anesthesia    supraclavicular block caused trouble breathing and heartrate dropped  . Congestive heart failure with left ventricular diastolic dysfunction, NYHA class 2 ([place])   . Diabetes mellitus    INSULIN DEPENDENT, CONTROLLED Type 2  . Diabetic neuropathy ([place])   . Diverticulosis of colon   . Hx of colonoscopy    10 YEARS AGO  . Hx of nonmelanoma skin cancer   . Hx of osteomyelitis   . Hyperlipidemia   . Hypertension    BENIGN ESSENTIAL  . Lower extremity venous stasis   . MI (  myocardial infarction) ([place]) 09/06/1995  . Mild renal insufficiency    decreased kidney function  . Moderate mitral regurgitation   . Morbid obesity with BMI of 40.0-44.9, adult ([place])   . Obstructive sleep apnea on CPAP    does not wear CPAP, wears 2L O2 [place]  . Osteoarthritis, multiple sites    . Osteomyelitis of vertebra of lumbar region ([place])   . Pneumonia   . Status post coronary artery stent placement 09/09/1995  . Vitamin D deficiency     Past Surgical History:  Procedure Laterality Date  . AMPUTATION Right 06/05/2018   Procedure: RIGHT THUMB REVISION AMPUTATION;  Surgeon: Charlotte Crumb, MD;  Location: [facility];  Service: Orthopedics;  Laterality: Right;  . ANGIOPLASTY  08/1995   STENT PLACEMENT  . ARM/ELBOW SURGERY  2016  . BACK SURGERY    . CORONARY STENT PLACEMENT  09/09/1995  . I&D EXTREMITY Right 03/23/2018   Procedure: IRRIGATION AND DEBRIDEMENT WITH INTERPHANGEAL JOINT [place] ROTATION FLAP;  Surgeon: Charlotte Crumb, MD;  Location: [facility];  Service: Orthopedics;  Laterality: Right;  . LAMINECTOMY     OPEN BIOPSY OF L4-S1  . PAROTIDECTOMY Right 06/2016  . POSTERIOR FUSION FOR OSTEOMYLITIS  11/2008  . REMOVAL OF CYST FROM EYELID  10/2010    MEDICATIONS: No current facility-administered medications for this encounter.    Marland Kitchen acetaminophen (TYLENOL) 500 MG tablet  . aspirin EC 81 MG tablet  . atorvastatin (LIPITOR) 20 MG tablet  . carvedilol (COREG) 25 MG tablet  . Cholecalciferol (VITAMIN D3) 3000 UNITS TABS  . furosemide (LASIX) 20 MG tablet  . hydrochlorothiazide (HYDRODIURIL) 25 MG tablet  . insulin lispro protamine-insulin lispro (HUMALOG 75/25) (75-25) 100 UNIT/ML SUSP  . lisinopril (PRINIVIL,ZESTRIL) 20 MG tablet  . nitroGLYCERIN (NITROSTAT) 0.4 MG SL tablet  . warfarin (COUMADIN) 5 MG tablet  . OXYGEN    Myra Gianotti, PA-C Surgical Short Stay/Anesthesiology [facility] Phone [phone] [facility] Phone [phone] 03/19/2019 2:16 PM

## 2019-03-19 ENCOUNTER — Encounter (HOSPITAL_COMMUNITY): Payer: Self-pay | Admitting: *Deleted

## 2019-03-19 ENCOUNTER — Other Ambulatory Visit: Payer: Self-pay

## 2019-03-19 NOTE — Progress Notes (Signed)
SDW-pre-op call completed by pt daughter (DPR/POA), Charles Chambers. Daughter denies that pt C/O SOB and chest pain. Daughter stated that pt is under the care of DR. Kotlaba, Cardiology ( local in [place]) and Dr. Rayann Heman, Cardiology. Daughter stated that pt PCP is Dr. Macarthur Critchley. Daughter to bring in recent labs printout on DOS. Daughter stated that pt last dose of Aspirin and Coumadin was Sunday as instructed. Daughter made aware to have pt stop taking vitamins, fish oil and herbal medications. Do not take any NSAIDs ie: Ibuprofen, Advil, Naproxen (Aleve), Motrin, BC and Goody Powder. Daughter made aware to have pt take only 11 units of Humalog 75/25 insulin tonight and no insulin on DOS. Daughter made aware to check pt BG every 2 hours prior to arrival to hospital on DOS. Daughter made aware to treat a BG < 70 with 4 ounces of apple or cranberry juice, wait 15 minutes after intervention to recheck BG, if BG remains < 70, call Short Stay unit to speak with a nurse.   Daughter denies that pt and members of the family yested positive for COVID-19 ( daughter reminded that pt must quarantine).  Daughter denies that pt and family members experienced the following symptoms:  Cough yes/no: No Fever (>100.37F)  yes/no: No Runny nose yes/no: No Sore throat yes/no: No Difficulty breathing/shortness of breath  yes/no: No  Have you or a family member traveled in the last 14 days and where? yes/no: No  Daughter reminded that hospital visitation restrictions are in effect and the importance of the restrictions.   Daughter understands that she must leave the SS unit when pt goes to surgery.  Daughter verbalized understanding of all pre-op instructions.   PA, Anesthesiology, asked to review pt history; see note.

## 2019-03-19 NOTE — Progress Notes (Signed)
Pt daughter Katharine Look, must accompany pt to Short Stay to sign consent, speak with anesthesiologist and provide info. on DOS.

## 2019-03-20 ENCOUNTER — Ambulatory Visit (HOSPITAL_COMMUNITY): Payer: Medicare Other | Admitting: Vascular Surgery

## 2019-03-20 ENCOUNTER — Encounter (HOSPITAL_COMMUNITY): Payer: Self-pay | Admitting: Certified Registered Nurse Anesthetist

## 2019-03-20 ENCOUNTER — Encounter (HOSPITAL_COMMUNITY)
Admission: RE | Disposition: A | Discharge status: Home / Self Care | Payer: Self-pay | Source: Home / Self Care | Attending: Orthopedic Surgery

## 2019-03-20 ENCOUNTER — Observation Stay (HOSPITAL_COMMUNITY)
Admission: RE | Admit: 2019-03-20 | Discharge: 2019-03-21 | Disposition: A | Discharge status: Home / Self Care | Payer: Medicare Other | Attending: Orthopedic Surgery | Admitting: Orthopedic Surgery

## 2019-03-20 DIAGNOSIS — I482 Chronic atrial fibrillation, unspecified: Secondary | ICD-10-CM

## 2019-03-20 DIAGNOSIS — Z85828 Personal history of other malignant neoplasm of skin: Secondary | ICD-10-CM | POA: Diagnosis not present

## 2019-03-20 DIAGNOSIS — I252 Old myocardial infarction: Secondary | ICD-10-CM | POA: Insufficient documentation

## 2019-03-20 DIAGNOSIS — E1169 Type 2 diabetes mellitus with other specified complication: Secondary | ICD-10-CM | POA: Diagnosis not present

## 2019-03-20 DIAGNOSIS — I110 Hypertensive heart disease with heart failure: Secondary | ICD-10-CM | POA: Insufficient documentation

## 2019-03-20 DIAGNOSIS — Z7982 Long term (current) use of aspirin: Secondary | ICD-10-CM | POA: Diagnosis not present

## 2019-03-20 DIAGNOSIS — I2510 Atherosclerotic heart disease of native coronary artery without angina pectoris: Secondary | ICD-10-CM | POA: Diagnosis not present

## 2019-03-20 DIAGNOSIS — Z886 Allergy status to analgesic agent status: Secondary | ICD-10-CM | POA: Insufficient documentation

## 2019-03-20 DIAGNOSIS — Z7901 Long term (current) use of anticoagulants: Secondary | ICD-10-CM | POA: Diagnosis not present

## 2019-03-20 DIAGNOSIS — I4821 Permanent atrial fibrillation: Secondary | ICD-10-CM | POA: Insufficient documentation

## 2019-03-20 DIAGNOSIS — Z6841 Body Mass Index (BMI) 40.0 and over, adult: Secondary | ICD-10-CM | POA: Diagnosis not present

## 2019-03-20 DIAGNOSIS — I255 Ischemic cardiomyopathy: Secondary | ICD-10-CM | POA: Insufficient documentation

## 2019-03-20 DIAGNOSIS — Z87891 Personal history of nicotine dependence: Secondary | ICD-10-CM | POA: Insufficient documentation

## 2019-03-20 DIAGNOSIS — Z9981 Dependence on supplemental oxygen: Secondary | ICD-10-CM | POA: Insufficient documentation

## 2019-03-20 DIAGNOSIS — C4012 Malignant neoplasm of short bones of left upper limb: Secondary | ICD-10-CM | POA: Diagnosis not present

## 2019-03-20 DIAGNOSIS — E785 Hyperlipidemia, unspecified: Secondary | ICD-10-CM | POA: Diagnosis not present

## 2019-03-20 DIAGNOSIS — Z888 Allergy status to other drugs, medicaments and biological substances status: Secondary | ICD-10-CM | POA: Insufficient documentation

## 2019-03-20 DIAGNOSIS — Z885 Allergy status to narcotic agent status: Secondary | ICD-10-CM | POA: Insufficient documentation

## 2019-03-20 DIAGNOSIS — Z8589 Personal history of malignant neoplasm of other organs and systems: Secondary | ICD-10-CM | POA: Insufficient documentation

## 2019-03-20 DIAGNOSIS — E559 Vitamin D deficiency, unspecified: Secondary | ICD-10-CM | POA: Insufficient documentation

## 2019-03-20 DIAGNOSIS — I5020 Unspecified systolic (congestive) heart failure: Secondary | ICD-10-CM | POA: Insufficient documentation

## 2019-03-20 DIAGNOSIS — E1140 Type 2 diabetes mellitus with diabetic neuropathy, unspecified: Secondary | ICD-10-CM | POA: Insufficient documentation

## 2019-03-20 DIAGNOSIS — G4733 Obstructive sleep apnea (adult) (pediatric): Secondary | ICD-10-CM | POA: Insufficient documentation

## 2019-03-20 DIAGNOSIS — E669 Obesity, unspecified: Secondary | ICD-10-CM

## 2019-03-20 DIAGNOSIS — Z955 Presence of coronary angioplasty implant and graft: Secondary | ICD-10-CM | POA: Insufficient documentation

## 2019-03-20 DIAGNOSIS — Z794 Long term (current) use of insulin: Secondary | ICD-10-CM | POA: Diagnosis not present

## 2019-03-20 DIAGNOSIS — I5022 Chronic systolic (congestive) heart failure: Secondary | ICD-10-CM

## 2019-03-20 DIAGNOSIS — Z79899 Other long term (current) drug therapy: Secondary | ICD-10-CM | POA: Insufficient documentation

## 2019-03-20 DIAGNOSIS — C4011 Malignant neoplasm of short bones of right upper limb: Principal | ICD-10-CM | POA: Insufficient documentation

## 2019-03-20 HISTORY — PX: AMPUTATION: SHX166

## 2019-03-20 LAB — GLUCOSE, CAPILLARY
Glucose-Capillary: 153 mg/dL — ABNORMAL HIGH (ref 70–99)
Glucose-Capillary: 126 mg/dL — ABNORMAL HIGH (ref 70–99)
Glucose-Capillary: 181 mg/dL — ABNORMAL HIGH (ref 70–99)
Glucose-Capillary: 204 mg/dL — ABNORMAL HIGH (ref 70–99)

## 2019-03-20 SURGERY — AMPUTATION DIGIT
Anesthesia: General | Site: Thumb | Laterality: Right

## 2019-03-20 MED ORDER — FENTANYL CITRATE (PF) 250 MCG/5ML IJ SOLN
INTRAMUSCULAR | Status: AC
  Filled 2019-03-20: qty 5

## 2019-03-20 MED ORDER — PROPOFOL 10 MG/ML IV BOLUS
INTRAVENOUS | Status: DC | PRN
  Administered 2019-03-20: 50 mg via INTRAVENOUS
  Administered 2019-03-20: 100 mg via INTRAVENOUS

## 2019-03-20 MED ORDER — INSULIN ASPART PROT & ASPART (70-30 MIX) 100 UNIT/ML ~~LOC~~ SUSP
12.0000 [IU] | Freq: Every day | SUBCUTANEOUS | Status: DC
  Administered 2019-03-20: 17:00:00 12 [IU] via SUBCUTANEOUS
  Filled 2019-03-20: qty 10

## 2019-03-20 MED ORDER — ONDANSETRON HCL 4 MG PO TABS: 4.0000 mg | ORAL_TABLET | Freq: Four times a day (QID) | ORAL | Status: DC | PRN

## 2019-03-20 MED ORDER — ACETAMINOPHEN 500 MG PO TABS: 1000.0000 mg | ORAL_TABLET | Freq: Two times a day (BID) | ORAL | Status: DC | PRN

## 2019-03-20 MED ORDER — INSULIN ASPART PROT & ASPART (70-30 MIX) 100 UNIT/ML ~~LOC~~ SUSP
22.0000 [IU] | Freq: Every day | SUBCUTANEOUS | Status: DC
  Administered 2019-03-21: 08:00:00 22 [IU] via SUBCUTANEOUS
  Filled 2019-03-20: qty 10

## 2019-03-20 MED ORDER — EPHEDRINE SULFATE-NACL 50-0.9 MG/10ML-% IV SOSY
PREFILLED_SYRINGE | INTRAVENOUS | Status: DC | PRN
  Administered 2019-03-20: 5 mg via INTRAVENOUS
  Administered 2019-03-20 (×3): 10 mg via INTRAVENOUS

## 2019-03-20 MED ORDER — METHOCARBAMOL 1000 MG/10ML IJ SOLN
500.0000 mg | Freq: Four times a day (QID) | INTRAVENOUS | Status: DC | PRN
  Filled 2019-03-20: qty 5

## 2019-03-20 MED ORDER — SODIUM CHLORIDE 0.9 % IV SOLN
INTRAVENOUS | Status: DC | PRN
  Administered 2019-03-20: 10:00:00 20 ug/min via INTRAVENOUS

## 2019-03-20 MED ORDER — FENTANYL CITRATE (PF) 100 MCG/2ML IJ SOLN
25.0000 ug | INTRAMUSCULAR | Status: DC | PRN
  Administered 2019-03-20: 13:00:00 25 ug via INTRAVENOUS

## 2019-03-20 MED ORDER — TRAMADOL HCL 50 MG PO TABS
100.0000 mg | ORAL_TABLET | Freq: Four times a day (QID) | ORAL | Status: DC
  Administered 2019-03-20 – 2019-03-21 (×5): 100 mg via ORAL
  Filled 2019-03-20 (×5): qty 2

## 2019-03-20 MED ORDER — LIDOCAINE 2% (20 MG/ML) 5 ML SYRINGE
INTRAMUSCULAR | Status: DC | PRN
  Administered 2019-03-20: 100 mg via INTRAVENOUS

## 2019-03-20 MED ORDER — CEFAZOLIN SODIUM-DEXTROSE 1-4 GM/50ML-% IV SOLN
1.0000 g | Freq: Three times a day (TID) | INTRAVENOUS | Status: DC
  Administered 2019-03-20 – 2019-03-21 (×3): 1 g via INTRAVENOUS
  Filled 2019-03-20 (×4): qty 50

## 2019-03-20 MED ORDER — ATORVASTATIN CALCIUM 10 MG PO TABS
20.0000 mg | ORAL_TABLET | Freq: Every day | ORAL | Status: DC
  Administered 2019-03-20: 20:00:00 20 mg via ORAL
  Filled 2019-03-20: qty 2

## 2019-03-20 MED ORDER — CEFAZOLIN SODIUM-DEXTROSE 2-4 GM/100ML-% IV SOLN
2.0000 g | INTRAVENOUS | Status: AC
  Administered 2019-03-20: 10:00:00 2 g via INTRAVENOUS
  Filled 2019-03-20: qty 100

## 2019-03-20 MED ORDER — CARVEDILOL 25 MG PO TABS
25.0000 mg | ORAL_TABLET | Freq: Two times a day (BID) | ORAL | Status: DC
  Administered 2019-03-20 – 2019-03-21 (×2): 25 mg via ORAL
  Filled 2019-03-20 (×2): qty 1

## 2019-03-20 MED ORDER — FENTANYL CITRATE (PF) 100 MCG/2ML IJ SOLN
INTRAMUSCULAR | Status: AC
  Administered 2019-03-20: 13:00:00 25 ug via INTRAVENOUS
  Filled 2019-03-20: qty 2

## 2019-03-20 MED ORDER — HYDROCHLOROTHIAZIDE 25 MG PO TABS
25.0000 mg | ORAL_TABLET | Freq: Every day | ORAL | Status: DC
  Administered 2019-03-21: 10:00:00 25 mg via ORAL
  Filled 2019-03-20: qty 1

## 2019-03-20 MED ORDER — FENTANYL CITRATE (PF) 250 MCG/5ML IJ SOLN
INTRAMUSCULAR | Status: DC | PRN
  Administered 2019-03-20 (×4): 50 ug via INTRAVENOUS

## 2019-03-20 MED ORDER — ONDANSETRON HCL 4 MG/2ML IJ SOLN: 4.0000 mg | Freq: Once | INTRAMUSCULAR | Status: DC | PRN

## 2019-03-20 MED ORDER — PHENYLEPHRINE 40 MCG/ML (10ML) SYRINGE FOR IV PUSH (FOR BLOOD PRESSURE SUPPORT)
PREFILLED_SYRINGE | INTRAVENOUS | Status: AC
  Filled 2019-03-20: qty 10

## 2019-03-20 MED ORDER — ONDANSETRON HCL 4 MG/2ML IJ SOLN: 4.0000 mg | Freq: Four times a day (QID) | INTRAMUSCULAR | Status: DC | PRN

## 2019-03-20 MED ORDER — LACTATED RINGERS IV SOLN
INTRAVENOUS | Status: DC
  Administered 2019-03-20: 09:00:00 via INTRAVENOUS

## 2019-03-20 MED ORDER — ROCURONIUM BROMIDE 10 MG/ML (PF) SYRINGE
PREFILLED_SYRINGE | INTRAVENOUS | Status: AC
  Filled 2019-03-20: qty 10

## 2019-03-20 MED ORDER — NITROGLYCERIN 0.4 MG SL SUBL: 0.4000 mg | SUBLINGUAL_TABLET | SUBLINGUAL | Status: DC | PRN

## 2019-03-20 MED ORDER — CHLORHEXIDINE GLUCONATE 4 % EX LIQD: 60.0000 mL | Freq: Once | CUTANEOUS | Status: DC

## 2019-03-20 MED ORDER — SUCCINYLCHOLINE CHLORIDE 200 MG/10ML IV SOSY
PREFILLED_SYRINGE | INTRAVENOUS | Status: AC
  Filled 2019-03-20: qty 10

## 2019-03-20 MED ORDER — METHOCARBAMOL 500 MG PO TABS: 500.0000 mg | ORAL_TABLET | Freq: Four times a day (QID) | ORAL | Status: DC | PRN

## 2019-03-20 MED ORDER — LISINOPRIL 20 MG PO TABS
20.0000 mg | ORAL_TABLET | Freq: Every day | ORAL | Status: DC
  Administered 2019-03-20 – 2019-03-21 (×2): 20 mg via ORAL
  Filled 2019-03-20 (×2): qty 1

## 2019-03-20 MED ORDER — MEPERIDINE HCL 25 MG/ML IJ SOLN: 6.2500 mg | INTRAMUSCULAR | Status: DC | PRN

## 2019-03-20 MED ORDER — LACTATED RINGERS IV SOLN
INTRAVENOUS | Status: DC
  Administered 2019-03-20: 14:00:00 via INTRAVENOUS

## 2019-03-20 MED ORDER — ONDANSETRON HCL 4 MG/2ML IJ SOLN
INTRAMUSCULAR | Status: DC | PRN
  Administered 2019-03-20: 4 mg via INTRAVENOUS

## 2019-03-20 MED ORDER — OXYCODONE HCL 5 MG/5ML PO SOLN: 5.0000 mg | Freq: Once | ORAL | Status: DC | PRN

## 2019-03-20 MED ORDER — DEXAMETHASONE SODIUM PHOSPHATE 10 MG/ML IJ SOLN
INTRAMUSCULAR | Status: DC | PRN
  Administered 2019-03-20: 4 mg via INTRAVENOUS

## 2019-03-20 MED ORDER — ASPIRIN EC 81 MG PO TBEC
81.0000 mg | DELAYED_RELEASE_TABLET | Freq: Every day | ORAL | Status: DC
  Administered 2019-03-21: 10:00:00 81 mg via ORAL
  Filled 2019-03-20: qty 1

## 2019-03-20 MED ORDER — VITAMIN D 25 MCG (1000 UNIT) PO TABS
3000.0000 [IU] | ORAL_TABLET | Freq: Every day | ORAL | Status: DC
  Administered 2019-03-21: 10:00:00 3000 [IU] via ORAL
  Filled 2019-03-20: qty 3

## 2019-03-20 MED ORDER — ONDANSETRON HCL 4 MG/2ML IJ SOLN
INTRAMUSCULAR | Status: AC
  Filled 2019-03-20: qty 2

## 2019-03-20 MED ORDER — OXYCODONE HCL 5 MG PO TABS: 5.0000 mg | ORAL_TABLET | Freq: Once | ORAL | Status: DC | PRN

## 2019-03-20 MED ORDER — DEXAMETHASONE SODIUM PHOSPHATE 10 MG/ML IJ SOLN
INTRAMUSCULAR | Status: AC
  Filled 2019-03-20: qty 2

## 2019-03-20 MED ORDER — 0.9 % SODIUM CHLORIDE (POUR BTL) OPTIME
TOPICAL | Status: DC | PRN
  Administered 2019-03-20: 1000 mL

## 2019-03-20 MED ORDER — VITAMIN D3 75 MCG (3000 UT) PO TABS: 3000.0000 [IU] | ORAL_TABLET | Freq: Every day | ORAL | Status: DC

## 2019-03-20 MED ORDER — PROPOFOL 1000 MG/100ML IV EMUL
INTRAVENOUS | Status: AC
  Filled 2019-03-20: qty 100

## 2019-03-20 MED ORDER — LIDOCAINE 2% (20 MG/ML) 5 ML SYRINGE
INTRAMUSCULAR | Status: AC
  Filled 2019-03-20: qty 5

## 2019-03-20 MED ORDER — BACITRACIN ZINC 500 UNIT/GM EX OINT
TOPICAL_OINTMENT | CUTANEOUS | Status: AC
  Filled 2019-03-20: qty 28.35

## 2019-03-20 MED ORDER — FUROSEMIDE 20 MG PO TABS: 20.0000 mg | ORAL_TABLET | ORAL | Status: DC

## 2019-03-20 MED ORDER — VITAMIN C 500 MG PO TABS
1000.0000 mg | ORAL_TABLET | Freq: Every day | ORAL | Status: DC
  Administered 2019-03-20 – 2019-03-21 (×2): 1000 mg via ORAL
  Filled 2019-03-20 (×2): qty 2

## 2019-03-20 MED ORDER — CEFAZOLIN SODIUM 1 G IJ SOLR
INTRAMUSCULAR | Status: AC
  Filled 2019-03-20: qty 20

## 2019-03-20 SURGICAL SUPPLY — 57 items
APPLICATOR COTTON TIP 6 STRL (MISCELLANEOUS) ×1 IMPLANT
APPLICATOR COTTON TIP 6IN STRL (MISCELLANEOUS) ×3
APPLIER CLIP 9.375 SM OPEN (CLIP) ×3
BANDAGE ACE 4X5 VEL STRL LF (GAUZE/BANDAGES/DRESSINGS) ×3 IMPLANT
BNDG COHESIVE 1X5 TAN STRL LF (GAUZE/BANDAGES/DRESSINGS) IMPLANT
BNDG CONFORM 2 STRL LF (GAUZE/BANDAGES/DRESSINGS) IMPLANT
BNDG ESMARK 4X9 LF (GAUZE/BANDAGES/DRESSINGS) ×3 IMPLANT
BNDG GAUZE ELAST 4 BULKY (GAUZE/BANDAGES/DRESSINGS) ×3 IMPLANT
CLIP APPLIE 9.375 SM OPEN (CLIP) ×1 IMPLANT
CORDS BIPOLAR (ELECTRODE) ×3 IMPLANT
COVER SURGICAL LIGHT HANDLE (MISCELLANEOUS) ×3 IMPLANT
COVER WAND RF STERILE (DRAPES) ×3 IMPLANT
CUFF TOURNIQUET SINGLE 18IN (TOURNIQUET CUFF) IMPLANT
CUFF TOURNIQUET SINGLE 24IN (TOURNIQUET CUFF) ×3 IMPLANT
DRAPE SURG 17X23 STRL (DRAPES) ×3 IMPLANT
DRSG MEPITEL 4X7.2 (GAUZE/BANDAGES/DRESSINGS) ×6 IMPLANT
GAUZE SPONGE 2X2 8PLY STRL LF (GAUZE/BANDAGES/DRESSINGS) IMPLANT
GAUZE SPONGE 4X4 12PLY STRL (GAUZE/BANDAGES/DRESSINGS) ×3 IMPLANT
GAUZE XEROFORM 1X8 LF (GAUZE/BANDAGES/DRESSINGS) IMPLANT
GAUZE XEROFORM 5X9 LF (GAUZE/BANDAGES/DRESSINGS) ×3 IMPLANT
GLOVE BIOGEL M 8.0 STRL (GLOVE) ×3 IMPLANT
GLOVE SS BIOGEL STRL SZ 8 (GLOVE) ×1 IMPLANT
GLOVE SUPERSENSE BIOGEL SZ 8 (GLOVE) ×2
GOWN STRL REUS W/ TWL LRG LVL3 (GOWN DISPOSABLE) ×1 IMPLANT
GOWN STRL REUS W/ TWL XL LVL3 (GOWN DISPOSABLE) ×2 IMPLANT
GOWN STRL REUS W/TWL LRG LVL3 (GOWN DISPOSABLE) ×2
GOWN STRL REUS W/TWL XL LVL3 (GOWN DISPOSABLE) ×4
JET LAVAGE IRRISEPT WOUND (IRRIGATION / IRRIGATOR) ×3
KIT BASIN OR (CUSTOM PROCEDURE TRAY) ×3 IMPLANT
KIT TURNOVER KIT B (KITS) ×3 IMPLANT
LAVAGE JET IRRISEPT WOUND (IRRIGATION / IRRIGATOR) ×1 IMPLANT
MANIFOLD NEPTUNE II (INSTRUMENTS) ×3 IMPLANT
NEEDLE HYPO 25GX1X1/2 BEV (NEEDLE) IMPLANT
NS IRRIG 1000ML POUR BTL (IV SOLUTION) ×3 IMPLANT
PACK ORTHO EXTREMITY (CUSTOM PROCEDURE TRAY) ×3 IMPLANT
PAD ARMBOARD 7.5X6 YLW CONV (MISCELLANEOUS) ×6 IMPLANT
PAD CAST 3X4 CTTN HI CHSV (CAST SUPPLIES) ×1 IMPLANT
PAD CAST 4YDX4 CTTN HI CHSV (CAST SUPPLIES) ×1 IMPLANT
PADDING CAST COTTON 3X4 STRL (CAST SUPPLIES) ×2
PADDING CAST COTTON 4X4 STRL (CAST SUPPLIES) ×2
SCRUB BETADINE 4OZ XXX (MISCELLANEOUS) ×3 IMPLANT
SOL PREP POV-IOD 4OZ 10% (MISCELLANEOUS) ×6 IMPLANT
SPECIMEN JAR SMALL (MISCELLANEOUS) ×3 IMPLANT
SPLINT FIBERGLASS 3X12 (CAST SUPPLIES) ×3 IMPLANT
SPONGE GAUZE 2X2 STER 10/PKG (GAUZE/BANDAGES/DRESSINGS)
SUT ETHILON 8 0 BV130 4 (SUTURE) ×3 IMPLANT
SUT MERSILENE 4 0 P 3 (SUTURE) IMPLANT
SUT PROLENE 4 0 PS 2 18 (SUTURE) ×6 IMPLANT
SUT PROLENE 6 0 P 1 18 (SUTURE) ×3 IMPLANT
SYR CONTROL 10ML LL (SYRINGE) IMPLANT
SYSTEM CHEST DRAIN TLS 7FR (DRAIN) ×3 IMPLANT
TOWEL OR 17X24 6PK STRL BLUE (TOWEL DISPOSABLE) ×3 IMPLANT
TOWEL OR 17X26 10 PK STRL BLUE (TOWEL DISPOSABLE) ×3 IMPLANT
TUBE CONNECTING 12'X1/4 (SUCTIONS)
TUBE CONNECTING 12X1/4 (SUCTIONS) IMPLANT
UNDERPAD 30X30 (UNDERPADS AND DIAPERS) ×3 IMPLANT
WATER STERILE IRR 1000ML POUR (IV SOLUTION) ×3 IMPLANT

## 2019-03-20 NOTE — Op Note (Signed)
Operative note  Ivone Licht MD  Preoperative diagnosis recurrent squamous cell cancer of the bone involving proximal phalanx soft tissue and metacarpal head right upper extremity.  History of parotid tumor in the distant past and previous hand surgery.  Postop diagnosis same  Procedure #1 radical resection right thumb tumor necessitating a formal ray amputation involving the metacarpal bone in its entirety and the soft tissue mass outside of the bone.  #2 Loop anastomosis radial digital nerve and ulnar digital nerve to the thumb status post resection due to amputation #3 extensive princeps pollicis arterial release and exploration #4 rotation flap closure right thumb  Surgeon Roseanne Kaufman  Anesthesia General LMA.  Estimated blood loss minimal.  Drains 1  Complications none immediate  Specimens 1 sent.  Operative indications patient presents the above-mentioned diagnosis.  Please see my office notes and history and physical note which extensively detail his issues and presentation.  The present time are planning a resection of the thumb to try and remove the tumor in its entirety.  Operative findings I was able to have a good sense of removal in terms of eliminating the tumor and the mass associated with it.  The patient had good resections and cuffs of normal tissue were maintained throughout the resection process.  Operative procedure: Patient was seen by myself and anesthesia taken to the operative theater and counseled in regards to the risk and benefits of surgery.  The patient had a 10-minute Hibiclens scrub followed by 10-minute surgical Betadine scrub followed by timeout been observed and sterile field being secured patient had a general LMA anesthetic performed.  Under a sterile field tourniquet was inflated and a incision was made.  I made a circumferential type racquet incision and then dissected the skin flaps up.  I was very careful to initially look at the dorsum of the hand  at the approximately the metacarpal base.  Isolated 2 branches of the superficial radial nerve and performed a crushing and cauterization technique of these.  Following this I then identified the abductor pollicis longus and extensor pollicis brevis and perform tenotomies at the area just proximal to the metacarpal base.  I then identified the EPL and perform tenotomy as well.  I then placed retractors and hemostats on the structures and dissected out the princeps pollicis artery from the metacarpal base down to the area of the first webspace.  This time I then swept the incision around to complete circumferentially and dissected out the subcutaneous tissue.  The patient had significant thickness and tumor burden in the soft tissues of the abductor thus I went down to the base of the abductor pollicis as well as opponents and sculpted this away from the field.  I then swung more towards the volar web and once again picked up the princeps pollicis artery and the radial and ulnar digital nerves.  The radial and ulnar digital nerves were both clipped and severed.  These were tagged with hemostat.  Following this I performed a tenotomy of the flexor pollicis longus.  I then moved over into the area of the dorsal and volar webspace and perform resection of the abductor pollicis as well as the first dorsal interosseous and then circumferentially completed the surgical dissection around the base of the metacarpal.  This allowed for removal of the metacarpal and the proximal phalanx of course.  I was quite pleased with this.  I left a cuff of tissue throughout the specimen that was removed and took intraoperative photos.  The  intraoperative photos as well as the specimen were retained for purposes of the chart and to understand the margins and dissection involved.  Following this I irrigated copiously and then freshened the radial and ulnar digital nerves to the thumb and performed an 8-0 nylon repair of these  for a loop nerve anastomosis to decrease nerve pain and neuroma formation.  As mentioned previously the superficial radial nerve underwent crushing and cauterization technique and was allowed to retract.    The patient had the tourniquet deflated and irrigation was applied I then removed a spur off of the trapezium bone so as to give the patient a nice smooth contour.  I then performed some back cuts to allow for the skin to rotate into place nicely.  Hemostasis was secured nicely I irrigated with 2 L of saline followed by antibiotic irrigant followed by placement of a TLS drain.  The patient tolerated this well.  The arterial structures were competent.  During the course of the dissection the princeps pollicis artery was extensively dissected under 4.5 loupe magnification and I did Hemoclip the princeps pollicis ulnar digital arterial branch to the thumb with Hemoclip.  I maintain strict hemostasis and make sure that we had a nice clean and dry field at the conclusion of the case.  The wound closed nicely and there were no complicating features.  Thus the patient underwent extensive arterial decompression and evaluation as well as radical tumor removal/ray amputation and nerve loop reconstruction of the radial and ulnar digital nerves with associated rotation flap closure.  Going forward I will asked the medicine service to be on board as a consultant in case we get into medical issues.  I will have him not on any type of blood thinners until he is stabilized and planning to go home and I did discuss this with his physician in [place].  All questions have been addressed.  Christena Sunderlin MD

## 2019-03-20 NOTE — H&P (Signed)
Charles Chambers is an 83 y.o. male.   Chief Complaint: Invasive squamous cell cancer of the bone right thumb HPI: Patient presents for surgical reconstruction of his thumb.  I discussed with he and his family all issues.  He has a long detailed history of presenting symptoms.  He has been managed by outside hand surgical service.  He was referred to my office for reconstructive/second opinion considerations.  MRI has revealed a squamous cell carcinoma of the bone involving the proximal phalanx and metacarpal head as well as a surrounding soft tissue mass.  I discussed with Dr. Julieanne Manson and his local physician in [place] the issues.  Given his age and other issues we are going to plan for a resection of his thumb to try and rid him of all of the macroscopic cancer burden.  We do not plan for adjuvant chemoradiation as he is done somewhat poorly with this in the past.  I discussed all issues with his daughter who is very versed in all of these issues and the plans.  We will admit him overnight given some difficulty has had in the past with his surgical endeavors and plan for general anesthetic given the problems she has had with the block in the past and the family preference.  I discussed with the patient family all issues plans concerns and my plan of care for postop.  Past Medical History:  Diagnosis Date  . Abnormal gait   . Atrial fibrillation ([place])   . CAD (coronary artery disease)   . 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 Chronic back pain   . Complication of anesthesia    supraclavicular block caused trouble breathing and heartrate dropped  . Congestive heart failure with left ventricular diastolic dysfunction, NYHA class 2 ([place])   . Diabetes mellitus    INSULIN DEPENDENT, CONTROLLED Type 2  . Diabetic neuropathy ([place])   . Diabetic neuropathy ([place])   . Diverticulosis of colon   . Hx of colonoscopy    10 YEARS AGO  . Hx of nonmelanoma  skin cancer   . Hx of osteomyelitis   . Hyperlipidemia   . Hypertension    BENIGN ESSENTIAL  . Lower extremity venous stasis   . MI (myocardial infarction) ([place]) 09/06/1995  . Mild renal insufficiency    decreased kidney function  . Moderate mitral regurgitation   . Morbid obesity with BMI of 40.0-44.9, adult ([place])   . Obstructive sleep apnea on CPAP    does not wear CPAP, wears 2L O2 [place]  . Osteoarthritis, multiple sites   . Osteomyelitis of vertebra of lumbar region ([place])   . Pneumonia   . Status post coronary artery stent placement 09/09/1995  . Vitamin D deficiency     Past Surgical History:  Procedure Laterality Date  . AMPUTATION Right 06/05/2018   Procedure: RIGHT THUMB REVISION AMPUTATION;  Surgeon: Charlotte Crumb, MD;  Location: [facility];  Service: Orthopedics;  Laterality: Right;  . ANGIOPLASTY  08/1995   STENT PLACEMENT  . ARM/ELBOW SURGERY  2016  . BACK SURGERY    . CORONARY STENT PLACEMENT  09/09/1995  . I&D EXTREMITY Right 03/23/2018   Procedure: IRRIGATION AND DEBRIDEMENT WITH INTERPHANGEAL JOINT [place] ROTATION FLAP;  Surgeon: Charlotte Crumb, MD;  Location: [facility];  Service: Orthopedics;  Laterality: Right;  . LAMINECTOMY     OPEN BIOPSY OF L4-S1  . PAROTIDECTOMY Right 06/2016  . POSTERIOR FUSION FOR OSTEOMYLITIS  11/2008  . REMOVAL  OF CYST FROM EYELID  10/2010    Family History  Problem Relation Age of Onset  . Heart disease Mother 10  . Heart disease Father    Social History:  reports that he has quit smoking. His smoking use included cigarettes. He has never used smokeless tobacco. He reports that he does not drink alcohol or use drugs.  Allergies:  Allergies  Allergen Reactions  . Contrast Media [Iodinated Diagnostic Agents]     : No contrast media dye due to fluctuations with renal function."  . Nsaids     Poor kidney function   . Other Other (See Comments)    Narcotics cause depression, especially codeine Contrast products can not be used  due to kidney function   . Baclofen Other (See Comments)    HALLUCINATIONS    . Codeine     depression  . Mushroom Extract Complex Nausea And Vomiting  . Neurontin [Gabapentin] Other (See Comments)    overstimulation    Medications Prior to Admission  Medication Sig Dispense Refill  . acetaminophen (TYLENOL) 500 MG tablet Take 1,000 mg by mouth 2 (two) times daily as needed for moderate pain.     Marland Kitchen aspirin EC 81 MG tablet Take 81 mg by mouth daily.      Marland Kitchen atorvastatin (LIPITOR) 20 MG tablet Take 20 mg by mouth at bedtime.     . carvedilol (COREG) 25 MG tablet Take 25 mg by mouth 2 (two) times daily with a meal.     . Cholecalciferol (VITAMIN D3) 3000 UNITS TABS Take 3,000 Units by mouth daily.     . furosemide (LASIX) 20 MG tablet Take 20 mg by mouth every Monday, Wednesday, and Friday.    . hydrochlorothiazide (HYDRODIURIL) 25 MG tablet Take 25 mg by mouth daily.      . insulin lispro protamine-insulin lispro (HUMALOG 75/25) (75-25) 100 UNIT/ML SUSP Inject 17-34 Units into the skin See admin instructions. Inject 34 units SQ in the morning and inject 17 units SQ in the evening    . lisinopril (PRINIVIL,ZESTRIL) 20 MG tablet Take 20 mg by mouth daily.    . nitroGLYCERIN (NITROSTAT) 0.4 MG SL tablet Place 0.4 mg under the tongue every 5 (five) minutes as needed for chest pain.   0  . warfarin (COUMADIN) 5 MG tablet Take 2.5-5 mg by mouth See admin instructions. Take 5 mg in the evening Tue, Thur, and Sun after evening meals. Take 2.5 mg in the evening on Mon, Wed, Fri, and Sat    . OXYGEN Inhale 2 L into the lungs See admin instructions. Use 2 L at bedtime and as needed for shortness of breath      Results for orders placed or performed during the hospital encounter of 03/20/19 (from the past 48 hour(s))  Glucose, capillary     Status: Abnormal   Collection Time: 03/20/19  7:24 AM  Result Value Ref Range   Glucose-Capillary 153 (H) 70 - 99 mg/dL   Comment 1 Notify RN    Comment 2  Document in Chart    No results found.  Review of Systems  Respiratory: Negative.   Genitourinary: Negative.     Blood pressure 130/76, pulse 71, temperature 98.5 F (36.9 C), temperature source Oral, resp. rate 20, height 5' 8.5" (1.74 m), weight 122.5 kg, SpO2 98 %. Physical Exam significant cancerous mass right thumb with a fungating/necrotic wound about the radial aspect of the thumb as described and noted.  I reviewed this with  the patient at length and his findings.  Unfortunately this is gone on to significant degree of abnormality and he essentially over the last 2 years has had a useless thumb.  MRI confirms the significant abnormality and I discussed this with all consultants and physicians involved in his care.   The patient is alert and oriented in no acute distress. The patient complains of pain in the affected upper extremity.  The patient is noted to have a normal HEENT exam. Lung fields show equal chest expansion and no shortness of breath. Abdomen exam is nontender without distention. Lower extremity examination does not show any fracture dislocation or blood clot symptoms. Pelvis is stable and the neck and back are stable and nontender.  Assessment/Plan Plan surgical excision squamous cell cancer of the bone to include excision of the thumb right upper extremity and rotation flap coverage.  We are planning surgery for your upper extremity. The risk and benefits of surgery to include risk of bleeding, infection, anesthesia,  damage to normal structures and failure of the surgery to accomplish its intended goals of relieving symptoms and restoring function have been discussed in detail. With this in mind we plan to proceed. I have specifically discussed with the patient the pre-and postoperative regime and the dos and don'ts and risk and benefits in great detail. Risk and benefits of surgery also include risk of dystrophy(CRPS), chronic nerve pain, failure of the healing  process to go onto completion and other inherent risks of surgery The relavent the pathophysiology of the disease/injury process, as well as the alternatives for treatment and postoperative course of action has been discussed in great detail with the patient who desires to proceed.  We will do everything in our power to help you (the patient) restore function to the upper extremity. It is a pleasure to see this patient today.   Willa Frater III, MD 03/20/2019, 7:39 AM

## 2019-03-20 NOTE — Anesthesia Procedure Notes (Signed)
Procedure Name: LMA Insertion Date/Time: 03/20/2019 10:08 AM Performed by: Julieta Bellini, CRNA Pre-anesthesia Checklist: Patient identified, Emergency Drugs available, Suction available and Patient being monitored Patient Re-evaluated:Patient Re-evaluated prior to induction Oxygen Delivery Method: Circle system utilized Preoxygenation: Pre-oxygenation with 100% oxygen Induction Type: IV induction LMA Size: 5.0 Number of attempts: 1 Airway Equipment and Method: Oral airway Placement Confirmation: positive ETCO2 and breath sounds checked- equal and bilateral Tube secured with: Tape Dental Injury: Teeth and Oropharynx as per pre-operative assessment

## 2019-03-20 NOTE — Consult Note (Signed)
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 -Per orthopedics  2. Persistent atrial fibrillation on chronic anticoagulation: Patient normally on Coumadin, but had been stopped prior to surgery. CHA2DS2-VASc score = at least 5(Age, hypertension, diabetes, history of heart failure). -Continue Coreg -Restart anticoagulation when given okay by orthopedics  3. Diabetes mellitus type 2: Home regimen includes Humalog 75/25 32 units every morning and 17 units every afternoon.   -Hypoglycemic protocol -CBGs q. before meals and at bedtime -Reduce home insulin regimen by approximately 30%(pharmacy substitution for NovoLog Mix 70/30 22 units every morning and 12 units upper) -Adjust regimen as needed  4. Systolic congestive heart failure: Last EF noted to be 35 to 40% in 2019.  Patient appears to be euvolemic at this time. -Strict intake and output and daily weights -Continue current home medication  5. Essential hypertension: Stable -Continue Coreg, hydrochlorothiazide, and lisinopril   6. OSA: Patient does not use CPAP at night.  Reports being on 2 L nasal cannula oxygen -Continue 2 L nasal cannula oxygen at night  I will followup again tomorrow. Please contact me if I can be of assistance in the meanwhile. Thank you for this consultation.  Chief Complaint: Squamous cell carcinoma of the right thumb  HPI:  Charles Chambers is  83 y.o. male with medical history significant of persistent atrial fibrillation on anticoagulation, HTN, HLD, CAD s/p stent, systolic CHF  last EF 15-17% in 09/2017, DM type II, osteomyelitis, squamous cell carcinoma, parotid gland cancer  S/p resection, and chronic back pain; who presented for surgical resection of squamous cell carcinoma the right thumb.  Patient had been advised to hold anticoagulation in preparation for surgery.  Patient reportedly had successful excision of squamous cell carcinoma at the bone with rotation flap.  [facility] consulted for overall medical management of comorbidities.  He normally takes Humalog 75/25 insulin 32 units every morning and 17 units every afternoon. He did not take any insulin this morning and took 11 units last night.  Denies having any low blood sugars or any weight gain.  Patient reports that he is chronically short of breath.    Review of Systems  Constitutional:       Denies weight gain  Respiratory: Positive for shortness of breath.   Cardiovascular: Negative for chest pain and leg swelling.    Past Medical History:  Diagnosis Date  . Abnormal gait   . Atrial fibrillation ([place])   . CAD (coronary artery disease)   . Cancer ([place]) 07/11/2016   of parotid gland, s/p parotidectomy  . Chronic back pain   . Complication of anesthesia    supraclavicular block caused trouble breathing and heartrate dropped  . Congestive heart failure with left ventricular diastolic dysfunction, NYHA class 2 ([place])   . Diabetes mellitus    INSULIN DEPENDENT, CONTROLLED Type 2  . Diabetic neuropathy ([place])   . Diabetic neuropathy ([place])   . Diverticulosis of colon   . Hx of colonoscopy    10 YEARS AGO  . Hx of nonmelanoma skin cancer   . Hx of osteomyelitis   . Hyperlipidemia   . Hypertension  BENIGN ESSENTIAL  . Lower extremity venous stasis   . MI (myocardial infarction) ([place]) 09/06/1995  . Mild renal insufficiency    decreased kidney function  . Moderate mitral regurgitation   . Morbid obesity with BMI of 40.0-44.9, adult ([place])   . Obstructive sleep apnea on CPAP    does  not wear CPAP, wears 2L O2 [place]  . Osteoarthritis, multiple sites   . Osteomyelitis of vertebra of lumbar region ([place])   . Pneumonia   . Status post coronary artery stent placement 09/09/1995  . Vitamin D deficiency    Past Surgical History:  Procedure Laterality Date  . AMPUTATION Right 06/05/2018   Procedure: RIGHT THUMB REVISION AMPUTATION;  Surgeon: Charlotte Crumb, MD;  Location: [facility];  Service: Orthopedics;  Laterality: Right;  . ANGIOPLASTY  08/1995   STENT PLACEMENT  . ARM/ELBOW SURGERY  2016  . BACK SURGERY    . CORONARY STENT PLACEMENT  09/09/1995  . I&D EXTREMITY Right 03/23/2018   Procedure: IRRIGATION AND DEBRIDEMENT WITH INTERPHANGEAL JOINT [place] ROTATION FLAP;  Surgeon: Charlotte Crumb, MD;  Location: [facility];  Service: Orthopedics;  Laterality: Right;  . LAMINECTOMY     OPEN BIOPSY OF L4-S1  . PAROTIDECTOMY Right 06/2016  . POSTERIOR FUSION FOR OSTEOMYLITIS  11/2008  . REMOVAL OF CYST FROM EYELID  10/2010   Social History:  reports that he has quit smoking. His smoking use included cigarettes. He has never used smokeless tobacco. He reports that he does not drink alcohol or use drugs.  Allergies  Allergen Reactions  . Contrast Media [Iodinated Diagnostic Agents]     : No contrast media dye due to fluctuations with renal function."  . Nsaids     Poor kidney function   . Other Other (See Comments)    Narcotics cause depression, especially codeine Contrast products can not be used due to kidney function   . Baclofen Other (See Comments)    HALLUCINATIONS    . Codeine     depression  . Mushroom Extract Complex Nausea And Vomiting  . Neurontin [Gabapentin] Other (See Comments)    overstimulation   Family History  Problem Relation Age of Onset  . Heart disease Mother 15  . Heart disease Father     Prior to Admission medications   Medication Sig Start Date End Date Taking? Authorizing Provider  acetaminophen (TYLENOL) 500 MG tablet Take 1,000 mg by  mouth 2 (two) times daily as needed for moderate pain.    Yes [provider]  aspirin EC 81 MG tablet Take 81 mg by mouth daily.     Yes [provider]  atorvastatin (LIPITOR) 20 MG tablet Take 20 mg by mouth at bedtime.    Yes [provider]  carvedilol (COREG) 25 MG tablet Take 25 mg by mouth 2 (two) times daily with a meal.    Yes [provider]  Cholecalciferol (VITAMIN D3) 3000 UNITS TABS Take 3,000 Units by mouth daily.    Yes [provider]  furosemide (LASIX) 20 MG tablet Take 20 mg by mouth every Monday, Wednesday, and Friday.   Yes [provider]  hydrochlorothiazide (HYDRODIURIL) 25 MG tablet Take 25 mg by mouth daily.     Yes [provider]  insulin lispro protamine-insulin lispro (HUMALOG 75/25) (75-25) 100 UNIT/ML SUSP Inject 17-34 Units into the skin See admin instructions. Inject 34 units SQ in the morning and inject 17 units SQ in the evening   Yes [provider]  lisinopril (PRINIVIL,ZESTRIL) 20 MG tablet Take 20 mg by mouth daily.   Yes [provider]  OXYGEN Inhale 2 L into the lungs See admin instructions. Use 2 L at bedtime and as needed for shortness of breath   Yes [provider]  warfarin (COUMADIN) 5 MG tablet Take 2.5-5 mg by mouth See admin instructions. Take 5 mg in the evening Tue, Thur, and Sun after evening meals. Take 2.5 mg in the evening on Mon, Wed, Fri, and Sat   Yes [provider]  nitroGLYCERIN (NITROSTAT) 0.4 MG SL tablet Place 0.4 mg under the tongue every 5 (five) minutes as needed for chest pain.  05/21/18   [provider]   Physical Exam:  Constitutional: Obese elderly male in no acute distress Vitals:   03/20/19 0721  BP: 130/76  Pulse: 71  Resp: 20  Temp: 98.5 F (36.9 C)  TempSrc: Oral  SpO2: 98%  Weight: 122.5 kg  Height: 5' 8.5" (1.74 m)   Eyes: PERRL, lids and conjunctivae normal ENMT: Mucous membranes are moist. Posterior  pharynx clear of any exudate or lesions.  Neck: normal, supple, no masses, no thyromegaly Respiratory: Decreased aeration no significant wheezes.  Patient on 2 L nasal cannula oxygen at this time maintaining O2 saturations. Cardiovascular: Irregular irregular, no murmurs / rubs / gallops. No extremity edema. 2+ pedal pulses. No carotid bruits.  Abdomen: no tenderness, no masses palpated. No hepatosplenomegaly. Bowel sounds positive.  Musculoskeletal: no clubbing / cyanosis. No joint deformity upper and lower extremities.  Status post amputation of the right thumb. Skin: Postoperative wound of right hand Neurologic: CN 2-12 grossly intact. Sensation intact, DTR normal. Strength 5/5 in all 4.  Psychiatric: Normal judgment and insight. Alert and oriented x 3. Normal mood.   Labs on Admission:  Basic Metabolic Panel: No results for input(s): NA, K, CL, CO2, GLUCOSE, BUN, CREATININE, CALCIUM, MG, PHOS in the last 168 hours. Liver Function Tests: No results for input(s): AST, ALT, ALKPHOS, BILITOT, PROT, ALBUMIN in the last 168 hours. No results for input(s): LIPASE, AMYLASE in the last 168 hours. No results for input(s): AMMONIA in the last 168 hours. CBC: No results for input(s): WBC, NEUTROABS, HGB, HCT, MCV, PLT in the last 168 hours. Cardiac Enzymes: No results for input(s): CKTOTAL, CKMB, CKMBINDEX, TROPONINI in the last 168 hours. BNP: Invalid input(s): POCBNP CBG: Recent Labs  Lab 03/20/19 0724 03/20/19 1206  GLUCAP 153* 126*    Radiological Exams on Admission: No results found.   Time spent: >45 minutes  [facility] Pager [phone]  If 7PM-7AM, please contact night-coverage [email or web address] Password [email or web address] 03/20/2019, 12:20 PM

## 2019-03-20 NOTE — Progress Notes (Signed)

## 2019-03-20 NOTE — Transfer of Care (Signed)
Immediate Anesthesia Transfer of Care Note  Patient: Charles Chambers  Procedure(s) Performed: Right thumb amputation with neurolysis and rotation flap coverage as necessary (Right Thumb)  Patient Location: PACU  Anesthesia Type:General  Level of Consciousness: drowsy and patient cooperative  Airway & Oxygen Therapy: Patient Spontanous Breathing  Post-op Assessment: Report given to RN, Post -op Vital signs reviewed and stable and Patient moving all extremities X 4  Post vital signs: Reviewed and stable  Last Vitals:  Vitals Value Taken Time  BP 112/60 03/20/2019 12:09 PM  Temp    Pulse 90 03/20/2019 12:09 PM  Resp 35 03/20/2019 12:09 PM  SpO2 97 % 03/20/2019 12:09 PM  Vitals shown include unvalidated device data.  Last Pain:  Vitals:   03/20/19 0920  TempSrc:   PainSc: 0-No pain      Patients Stated Pain Goal: 4 (39/76/73 [id number])  Complications: No apparent anesthesia complications

## 2019-03-20 NOTE — Anesthesia Postprocedure Evaluation (Signed)
Anesthesia Post Note  Patient: Charles Chambers  Procedure(s) Performed: Right thumb amputation with neurolysis and rotation flap coverage as necessary (Right Thumb)     Patient location during evaluation: PACU Anesthesia Type: General Level of consciousness: sedated and patient cooperative Pain management: pain level controlled Vital Signs Assessment: post-procedure vital signs reviewed and stable Respiratory status: spontaneous breathing Cardiovascular status: stable Anesthetic complications: no    Last Vitals:  Vitals:   03/20/19 1409 03/20/19 2040  BP: (!) 110/96 121/66  Pulse: 87 70  Resp: 16 16  Temp: 36.4 C 36.9 C  SpO2: 90% 100%    Last Pain:  Vitals:   03/20/19 2040  TempSrc: Oral  PainSc:                  Nolon Nations

## 2019-03-20 NOTE — Anesthesia Preprocedure Evaluation (Signed)
Anesthesia Evaluation  Patient identified by MRN, date of birth, ID band Patient awake    Reviewed: Allergy & Precautions, NPO status , Patient's Chart, lab work & pertinent test results, reviewed documented beta blocker date and time   History of Anesthesia Complications (+) history of anesthetic complications  Airway Mallampati: III     Mouth opening: Limited Mouth Opening  Dental  (+) Poor Dentition, Dental Advisory Given   Pulmonary sleep apnea , pneumonia, former smoker,    breath sounds clear to auscultation       Cardiovascular hypertension, Pt. on medications and Pt. on home beta blockers + CAD, + Past MI, + Cardiac Stents and +CHF  Normal cardiovascular exam Rhythm:Regular Rate:Normal  Class II NYHA. EF 35-40%   Neuro/Psych    GI/Hepatic   Endo/Other  diabetes, Insulin DependentMorbid obesity  Renal/GU Renal disease     Musculoskeletal  (+) Arthritis ,   Abdominal (+) + obese,   Peds  Hematology   Anesthesia Other Findings Jacinta Shoe, PA-C Physician Assistant Anesthesiology Progress Notes Addendum Date of Service:  06/03/2018 4:33 PM         Show:Clear all ManualTemplateCopied  Added by: Jacinta Shoe, PA-C  Hover for details Anesthesia Chart Review: SAME DAY WORK-UP   Case:  [id number] Date/Time:  06/05/18 0715  Procedure:  RIGHT THUMB REVISION AMPUTATION (Right ) - REQUEST NO DRUGS MONITOR ONLY  Anesthesia type:  Monitor Anesthesia Care  Pre-op diagnosis:  RIGHT THUMB SQUAMOUS CELL CARCINOMA  Location:  [facility]  Surgeon:  Charlotte Crumb, MD    DISCUSSION: Patient is a 83 year old male scheduled for the above procedure. He underwent I&D of right thumb interphalangeal joint and rotational flap coverage with cyst excision on 03/23/18. Pathology showed recurrent squamous cell carcinoma. At follow-up, exam showed recurrence of this mass so interphalangeal  joint level amputation with volar advancement flap to cover the defect was recommended.   Although case is posted for MAC, per Jeani Hawking at Dr. Bertis Ruddy office, patient/family only wanted procedure done under "straight local anesthetic as he had a significant reaction to his supraclavicular block from his previous surgery" (reportedly devloped SOB, hoarseness, BP change). Dr. Burney Gauze is willing to do procedure with straight local with anesthesia monitoring. He is to continue ASA and warfarin perioperatively.  History includes CAD/MI (BMS to LAD 09/10/95, [facility]), ischemic cardiomyopathy (EF 35-40% '18), CHF, permanent afib, DM (diet controlled), HTN, OSA (uses O2 at night)  Discussed with anesthesiologist Oren Bracket, MD. As above, straight local with anesthesia monitoring requested. Anesthesiologist will evaluate on the day of surgery. (UPDATE 1/94/17 4:08 PM: Elmo Putt from Dr. Bertis Ruddy office called. He has ordered pre-operative antibiotic and is planning to administer local while patient is in [place] before patient is taken into the OR. Elmo Putt has coordinated this through April Green, RN who will have supplies for local available.)   PROVIDERS: Earney Mallet, MD is PCP Thompson Grayer, MD is cardiologist. Last visit 04/03/18. (He was previously followed by Raechel Chute, MD in [place], [place] Patient stable at that time, although with reported weight loss of 19 lbs in 2 months. No ischemic symptoms. He wrote, "Given advanced age and fragility, would advise conservative management going forward." Two month follow-up planned. In the interim, patient's PCP has had to adjust some of patient's medications given hypotension and an increase in Cr to 1.62, BUN 440, GFR 40. Dr. Rayann Heman is deferring these adjustments to patient's PCP.     LABS:  He is for labs on the day of surgery.   IMAGES: CXR 09/02/17 ([facility]):Chronic lung changes;no acute findings  EKG: 02/20/18: Atrial  fibrillation at 82 bpm with premature ventricular or aberrantly conducted complexes. LAD. Nonspecific intraventricular block. Inferior infarct, age undetermined. Anterolateral infarct, age undetermined.   CV: Echo 05/15/17 ([facility]): 1. Moderate ischemic cardiomyopathy and two-vessel territories (LAD and RCA). Estimated EF 35-40%limited by lack of contrast and poor endocardial resolution. 2. Normal RV function with normal RAP. 3. Structurally and functionally grossly normal cardiac valves. Mild central MR. Trace AI.    Past Medical History: Diagnosis Date         Reproductive/Obstetrics                             Anesthesia Physical  Anesthesia Plan  ASA: III  Anesthesia Plan: General   Post-op Pain Management:    Induction: Intravenous  PONV Risk Score and Plan: 3 and Ondansetron, Dexamethasone and Treatment may vary due to age or medical condition  Airway Management Planned: Oral ETT  Additional Equipment: None  Intra-op Plan:   Post-operative Plan: Extubation in OR  Informed Consent: I have reviewed the patients History and Physical, chart, labs and discussed the procedure including the risks, benefits and alternatives for the proposed anesthesia with the patient or authorized representative who has indicated his/her understanding and acceptance.     Dental advisory given  Plan Discussed with: CRNA  Anesthesia Plan Comments:         Anesthesia Quick Evaluation

## 2019-03-21 ENCOUNTER — Encounter (HOSPITAL_COMMUNITY): Payer: Self-pay | Admitting: Orthopedic Surgery

## 2019-03-21 DIAGNOSIS — C4011 Malignant neoplasm of short bones of right upper limb: Secondary | ICD-10-CM | POA: Diagnosis not present

## 2019-03-21 DIAGNOSIS — C4012 Malignant neoplasm of short bones of left upper limb: Secondary | ICD-10-CM | POA: Diagnosis not present

## 2019-03-21 LAB — BASIC METABOLIC PANEL
Anion gap: 10 (ref 5–15)
BUN: 32 mg/dL — ABNORMAL HIGH (ref 8–23)
CO2: 25 mmol/L (ref 22–32)
Calcium: 8.6 mg/dL — ABNORMAL LOW (ref 8.9–10.3)
Chloride: 96 mmol/L — ABNORMAL LOW (ref 98–111)
Creatinine, Ser: 1.32 mg/dL — ABNORMAL HIGH (ref 0.61–1.24)
GFR calc Af Amer: 59 mL/min — ABNORMAL LOW (ref >60)
GFR calc non Af Amer: 51 mL/min — ABNORMAL LOW (ref >60)
Glucose, Bld: 251 mg/dL — ABNORMAL HIGH (ref 70–99)
Potassium: 4.6 mmol/L (ref 3.5–5.1)
Sodium: 131 mmol/L — ABNORMAL LOW (ref 135–145)

## 2019-03-21 LAB — GLUCOSE, CAPILLARY: Glucose-Capillary: 253 mg/dL — ABNORMAL HIGH (ref 70–99)

## 2019-03-21 MED ORDER — TRAMADOL HCL 50 MG PO TABS: 50.0000 mg | ORAL_TABLET | Freq: Four times a day (QID) | ORAL | 0 refills | Status: DC | PRN

## 2019-03-21 MED ORDER — ONDANSETRON HCL 4 MG PO TABS: ORAL_TABLET | ORAL | 1 refills | Status: DC

## 2019-03-21 MED ORDER — DOXYCYCLINE HYCLATE 50 MG PO CAPS: 100.0000 mg | ORAL_CAPSULE | Freq: Two times a day (BID) | ORAL | 0 refills | Status: AC

## 2019-03-21 NOTE — Progress Notes (Signed)
Patient discharged to home with instructions given to patient's daughter.

## 2019-03-21 NOTE — Discharge Instructions (Signed)
Please keep your bandage clean and dry and elevate over the next 2 days.  For any problems please call Dr. Vanetta Shawl office immediately.  It is very important to take the antibiotics until they are complete and to make sure that we have no complications.  Please be careful with your hand.  You can move your fingers but do not remove the bandage.  If the bandage becomes loose you can overwrap it with a new Ace wrap which we will provide.  We have prescribed an antibiotic as well as a pain medicine.  Take the pain medicine if you need it and take the antibiotic until it is completed.   We recommend that you to take vitamin C 1000 mg a day to promote healing. We also recommend that if you require  pain medicine that you take a stool softener to prevent constipation as most pain medicines will have constipation side effects. We recommend either Peri-Colace or Senokot and recommend that you also consider adding MiraLAX as well to prevent the constipation affects from pain medicine if you are required to use them. These medicines are over the counter and may be purchased at a local pharmacy. A cup of yogurt and a probiotic can also be helpful during the recovery process as the medicines can disrupt your intestinal environment.   Keep bandage clean and dry.  Call for any problems.  No smoking.  Criteria for driving a car: you should be off your pain medicine for 7-8 hours, able to drive one handed(confident), thinking clearly and feeling able in your judgement to drive. Continue elevation as it will decrease swelling.  If instructed by MD move your fingers within the confines of the bandage/splint.  Use ice if instructed by your MD. Call immediately for any sudden loss of feeling in your hand/arm or change in functional abilities of the extremity.

## 2019-03-21 NOTE — Progress Notes (Signed)
PROGRESS NOTE  [name] VZC:[id number] DOB: Dec 22, 1935 DOA: 03/20/2019 PCP: Earney Mallet, MD   Requesting physician: Roseanne Kaufman, MD Date of consultation: 03/20/2019 Reason for consultation: Medical management   HPI/Recap of past 24 hours: Charles Chambers is  83 y.o. male with medical history significant of persistent atrial fibrillation on anticoagulation, HTN, HLD, CAD s/p stent, systolic CHF last EF 12-87% in 09/2017, DM type II, osteomyelitis, squamous cell carcinoma, parotid gland cancer  S/p resection, and chronic back pain; who presented for surgical resection of squamous cell carcinoma the right thumb.  Patient had been advised to hold anticoagulation in preparation for surgery.  Patient reportedly had successful excision of squamous cell carcinoma at the bone with rotation flap.  [facility] consulted for overall medical management of comorbidities.  He normally takes Humalog 75/25 insulin 32 units every morning and 17 units every afternoon. He did not take any insulin this morning and took 11 units last night.  Denies having any low blood sugars or any weight gain.  Patient reports that he is chronically short of breath.  03/21/19: Patient seen and examined at his bedside.  No acute events overnight.  He has no new concerns this morning.  Denies any pain and stating that his right hand feels best today.   Assessment/Plan: Active Problems:   Cancer of first metacarpal bone of left hand ([place])   1. 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 -Per orthopedics  2. Persistent atrial fibrillation on chronic anticoagulation: Patient normally on Coumadin, but had been stopped prior to surgery. CHA2DS2-VASc score = at least 5(Age, hypertension, diabetes, history of heart failure). -Continue Coreg -Restart anticoagulation when given okay by orthopedics  3. Diabetes mellitus type 2:  Home regimen includes Humalog 75/25 32 units every morning and 17 units every afternoon.   -Hypoglycemic protocol -CBGs q. before meals and at bedtime -Reduce home insulin regimen by approximately 30%(pharmacy substitution for NovoLog Mix 70/30 22 units every morning and 12 units upper) -Adjust regimen as needed  4. Systolic congestive heart failure: Last EF noted to be 35 to 40% in 2019.  Patient appears to be euvolemic at this time. -Strict intake and output and daily weights -Continue current home medication  5. Essential hypertension: Stable -Continue Coreg, hydrochlorothiazide, and lisinopril   6. OSA: Patient does not use CPAP at night.  Reports being on 2 L nasal cannula oxygen -Continue 2 L nasal cannula oxygen at night  Vital signs and labs reviewed and are stable.  Will need to follow-up with his PCP posthospitalization.  Signing off.  Thank you for allowing [place] to participate in the care of this patient.  Please call with questions.    Objective: Vitals:   03/20/19 1355 03/20/19 1409 03/20/19 2040 03/21/19 0445  BP: 104/66 (!) 110/96 121/66 127/77  Pulse: 80 87 70 85  Resp: 11 16 16 18   Temp: (!) 97.2 F (36.2 C) 97.6 F (36.4 C) 98.5 F (36.9 C) 98.3 F (36.8 C)  TempSrc:  Oral Oral Oral  SpO2: 96% 90% 100% 96%  Weight:      Height:        Intake/Output Summary (Last 24 hours) at 03/21/2019 1303 Last data filed at 03/21/2019 0900 Gross per 24 hour  Intake 709.13 ml  Output 746 ml  Net -36.87 ml   Filed Weights   03/20/19 0721  Weight: 122.5 kg    Exam:  .  General: 83 y.o. year-old male well developed well nourished in no acute distress.  Alert and oriented x3. . Cardiovascular: Regular rate and rhythm with no rubs or gallops.  No thyromegaly or JVD noted.   Marland Kitchen Respiratory: Clear to auscultation with no wheezes or rales. Good inspiratory effort. . Abdomen: Soft nontender nondistended with normal bowel sounds x4 quadrants. . Psychiatry: Mood is  appropriate for condition and setting   Data Reviewed: CBC: No results for input(s): WBC, NEUTROABS, HGB, HCT, MCV, PLT in the last 168 hours. Basic Metabolic Panel: Recent Labs  Lab 03/21/19 0336  NA 131*  K 4.6  CL 96*  CO2 25  GLUCOSE 251*  BUN 32*  CREATININE 1.32*  CALCIUM 8.6*   GFR: Estimated Creatinine Clearance: 57.3 mL/min (A) (by C-G formula based on SCr of 1.32 mg/dL (H)). Liver Function Tests: No results for input(s): AST, ALT, ALKPHOS, BILITOT, PROT, ALBUMIN in the last 168 hours. No results for input(s): LIPASE, AMYLASE in the last 168 hours. No results for input(s): AMMONIA in the last 168 hours. Coagulation Profile: No results for input(s): INR, PROTIME in the last 168 hours. Cardiac Enzymes: No results for input(s): CKTOTAL, CKMB, CKMBINDEX, TROPONINI in the last 168 hours. BNP (last 3 results) No results for input(s): PROBNP in the last 8760 hours. HbA1C: No results for input(s): HGBA1C in the last 72 hours. CBG: Recent Labs  Lab 03/20/19 0724 03/20/19 1206 03/20/19 1645 03/20/19 2043 03/21/19 0744  GLUCAP 153* 126* 181* 204* 253*   Lipid Profile: No results for input(s): CHOL, HDL, LDLCALC, TRIG, CHOLHDL, LDLDIRECT in the last 72 hours. Thyroid Function Tests: No results for input(s): TSH, T4TOTAL, FREET4, T3FREE, THYROIDAB in the last 72 hours. Anemia Panel: No results for input(s): VITAMINB12, FOLATE, FERRITIN, TIBC, IRON, RETICCTPCT in the last 72 hours. Urine analysis: No results found for: COLORURINE, APPEARANCEUR, LABSPEC, [place], GLUCOSEU, HGBUR, BILIRUBINUR, KETONESUR, PROTEINUR, UROBILINOGEN, NITRITE, LEUKOCYTESUR Sepsis Labs: @LABRCNTIP (procalcitonin:4,lacticidven:4)  ) Recent Results (from the past 240 hour(s))  Novel Coronavirus, NAA (hospital order; send-out to ref lab)     Status: None   Collection Time: 03/17/19  2:01 PM  Result Value Ref Range Status   SARS-CoV-2, NAA NOT DETECTED NOT DETECTED Final    Comment: (NOTE)  This test was developed and its performance characteristics determined by [employer]. This test has not been FDA cleared or approved. This test has been authorized by FDA under an Emergency Use Authorization (EUA). This test is only authorized for the duration of time the declaration that circumstances exist justifying the authorization of the emergency use of in vitro diagnostic tests for detection of SARS-CoV-2 virus and/or diagnosis of COVID-19 infection under section 564(b)(1) of the Act, 21 U.S.C. 488QBV-6(X)(4), unless the authorization is terminated or revoked sooner. When diagnostic testing is negative, the possibility of a false negative result should be considered in the context of a patient's recent exposures and the presence of clinical signs and symptoms consistent with COVID-19. An individual without symptoms of COVID-19 and who is not shedding SARS-CoV-2 virus would expect to have a negative (not detected) result in this assay. Performed  At: [facility] [address] [place], [place] [id number] Rush Farmer MD KL:[id number]    [place]  Final    Comment: Performed at [facility], [place] [address], [place], [place] [address]      Studies: No results found.  Scheduled Meds: . aspirin EC  81 mg Oral Daily  . atorvastatin  20 mg Oral  QHS  . carvedilol  25 mg Oral BID WC  . cholecalciferol  3,000 Units Oral Daily  . [START ON 03/22/2019] furosemide  20 mg Oral Q M,W,F  . hydrochlorothiazide  25 mg Oral Daily  . insulin aspart protamine- aspart  12 Units Subcutaneous Q supper  . insulin aspart protamine- aspart  22 Units Subcutaneous Q breakfast  . lisinopril  20 mg Oral Daily  . traMADol  100 mg Oral Q6H  . vitamin C  1,000 mg Oral Daily    Continuous Infusions: .  ceFAZolin (ANCEF) IV 1 g (03/21/19 1034)  . lactated ringers 50 mL/hr at 03/20/19 1420  . methocarbamol (ROBAXIN) IV       LOS: 1 day      Kayleen Memos, MD [facility] Pager [phone]  If 7PM-7AM, please contact night-coverage [email or web address] Password [email or web address] 03/21/2019, 1:03 PM

## 2019-03-21 NOTE — Care Management CC44 (Signed)
Condition Code 44 Documentation Completed  Patient Details  Name: Edinson Domeier MRN: [id number] Date of Birth: 11-30-1935   Condition Code 44 given:  Yes Patient signature on Condition Code 44 notice:  Yes Documentation of 2 MD's agreement:  Yes Code 44 added to claim:  Yes    Claudie Leach, RN 03/21/2019, 10:09 AM

## 2019-03-21 NOTE — Discharge Summary (Signed)
Physician Discharge Summary  Patient ID: Charles Chambers MRN: [id number] DOB/AGE: 1936/01/06 83 y.o.  Admit date: 03/20/2019 Discharge date:   Admission Diagnoses: Left comminuted complex distal radius fracture Past Medical History:  Diagnosis Date  . Abnormal gait   . Atrial fibrillation ([place])   . CAD (coronary artery disease)   . Cancer ([place]) 07/11/2016   of parotid gland, s/p parotidectomy  . Chronic back pain   . Complication of anesthesia    supraclavicular block caused trouble breathing and heartrate dropped  . Congestive heart failure with left ventricular diastolic dysfunction, NYHA class 2 ([place])   . Diabetes mellitus    INSULIN DEPENDENT, CONTROLLED Type 2  . Diabetic neuropathy ([place])   . Diabetic neuropathy ([place])   . Diverticulosis of colon   . Hx of colonoscopy    10 YEARS AGO  . Hx of nonmelanoma skin cancer   . Hx of osteomyelitis   . Hyperlipidemia   . Hypertension    BENIGN ESSENTIAL  . Lower extremity venous stasis   . MI (myocardial infarction) ([place]) 09/06/1995  . Mild renal insufficiency    decreased kidney function  . Moderate mitral regurgitation   . Morbid obesity with BMI of 40.0-44.9, adult ([place])   . Obstructive sleep apnea on CPAP    does not wear CPAP, wears 2L O2 [place]  . Osteoarthritis, multiple sites   . Osteomyelitis of vertebra of lumbar region ([place])   . Pneumonia   . Status post coronary artery stent placement 09/09/1995  . Vitamin D deficiency     Discharge Diagnoses:  Active Problems:   Cancer of first metacarpal bone of left hand ([place])   Surgeries: Procedure(s): Right thumb amputation with neurolysis and rotation flap coverage as necessary on 03/20/2019    Consultants:   Discharged Condition: Improved  Hospital Course: Charles Chambers is an 83 y.o. male who was admitted 03/20/2019 with a chief complaint of No chief complaint on file. , and found to have a diagnosis of Left comminuted complex distal radius fracture.  They were  brought to the operating room on 03/20/2019 and underwent Procedure(s): Right thumb amputation with neurolysis and rotation flap coverage as necessary.    They were given perioperative antibiotics:  Anti-infectives (From admission, onward)   Start     Dose/Rate Route Frequency Ordered Stop   03/21/19 0000  doxycycline (VIBRAMYCIN) 50 MG capsule     100 mg Oral 2 times daily 03/21/19 0806 04/01/19 2359   03/20/19 1800  ceFAZolin (ANCEF) IVPB 1 g/50 mL premix     1 g 100 mL/hr over 30 Minutes Intravenous Every 8 hours 03/20/19 1408     03/20/19 0915  ceFAZolin (ANCEF) IVPB 2g/100 mL premix     2 g 200 mL/hr over 30 Minutes Intravenous On call to O.R. 03/20/19 8416 03/20/19 1029    .  They were given sequential compression devices, early ambulation, and Other (comment) for DVT prophylaxis.  Recent vital signs:  Patient Vitals for the past 24 hrs:  BP Temp Temp src Pulse Resp SpO2  03/21/19 0445 127/77 98.3 F (36.8 C) Oral 85 18 96 %  03/20/19 2040 121/66 98.5 F (36.9 C) Oral 70 16 100 %  03/20/19 1409 (!) 110/96 97.6 F (36.4 C) Oral 87 16 90 %  03/20/19 1355 104/66 (!) 97.2 F (36.2 C) - 80 11 96 %  03/20/19 1340 (!) 109/97 - - 75 16 96 %  03/20/19 1325 102/68 - - 71 15 98 %  03/20/19  1310 111/79 - - 73 15 98 %  03/20/19 1255 (!) 98/57 - - 69 14 97 %  03/20/19 1240 (!) 110/92 - - 76 15 97 %  03/20/19 1225 103/68 - - 69 20 96 %  03/20/19 1209 112/60 (!) 97 F (36.1 C) - (!) 107 20 97 %  .  Recent laboratory studies: No results found.  Discharge Medications:   Allergies as of 03/21/2019      Reactions   Contrast Media [iodinated Diagnostic Agents]    : No contrast media dye due to fluctuations with renal function."   Nsaids    Poor kidney function    Other Other (See Comments)   Narcotics cause depression, especially codeine Contrast products can not be used due to kidney function   Baclofen Other (See Comments)   HALLUCINATIONS    Codeine    depression   Mushroom  Extract Complex Nausea And Vomiting   Neurontin [gabapentin] Other (See Comments)   overstimulation      Medication List    TAKE these medications   acetaminophen 500 MG tablet Commonly known as:  TYLENOL Take 1,000 mg by mouth 2 (two) times daily as needed for moderate pain.   aspirin EC 81 MG tablet Take 81 mg by mouth daily.   atorvastatin 20 MG tablet Commonly known as:  LIPITOR Take 20 mg by mouth at bedtime.   carvedilol 25 MG tablet Commonly known as:  COREG Take 25 mg by mouth 2 (two) times daily with a meal.   doxycycline 50 MG capsule Commonly known as:  VIBRAMYCIN Take 2 capsules (100 mg total) by mouth 2 (two) times daily for 11 days.   furosemide 20 MG tablet Commonly known as:  LASIX Take 20 mg by mouth every Monday, Wednesday, and Friday.   hydrochlorothiazide 25 MG tablet Commonly known as:  HYDRODIURIL Take 25 mg by mouth daily.   insulin lispro protamine-lispro (75-25) 100 UNIT/ML Susp injection Commonly known as:  HUMALOG 75/25 MIX Inject 17-34 Units into the skin See admin instructions. Inject 34 units SQ in the morning and inject 17 units SQ in the evening   lisinopril 20 MG tablet Commonly known as:  ZESTRIL Take 20 mg by mouth daily.   nitroGLYCERIN 0.4 MG SL tablet Commonly known as:  NITROSTAT Place 0.4 mg under the tongue every 5 (five) minutes as needed for chest pain.   ondansetron 4 MG tablet Commonly known as:  Zofran Please prescribe the 8 mg disintegrating tablets   OXYGEN Inhale 2 L into the lungs See admin instructions. Use 2 L at bedtime and as needed for shortness of breath   traMADol 50 MG tablet Commonly known as:  Ultram Take 1 tablet (50 mg total) by mouth every 6 (six) hours as needed for moderate pain.   Vitamin D3 75 MCG (3000 UT) Tabs Take 3,000 Units by mouth daily.   warfarin 5 MG tablet Commonly known as:  COUMADIN Take 2.5-5 mg by mouth See admin instructions. Take 5 mg in the evening Tue, Thur, and Sun  after evening meals. Take 2.5 mg in the evening on Mon, Wed, Fri, and Sat       Diagnostic Studies: No results found.  They benefited maximally from their hospital stay and there were no complications.     Disposition:   Follow-up Information    Roseanne Kaufman, MD Follow up in 14 day(s).   Specialty:  Orthopedic Surgery Why:  We will call for your follow-up to be  seen in 14 days. Contact information: [address] [address] [phone]          Status post surgical reconstruction with tumor removal and right thumb ray amputation Mar 20, 2019.  Patient is stable awake alert and oriented.  He has had a unremarkable hospital stay.  No bleeding.  Drain is removed today at bedside and there are no complicating features.  He has no signs of UTI, DVT, or other problems.  I will have him see me back in the office in 14 days.  I discussed all issues with his daughter Katharine Look at cell phone [phone].  We will call for their follow-up appointment to be seen back in the office.  Should any problems occur will be immediately available.  He had no complications during this hospital stay and all went quite well.  We will await surgical pathology and continue a close observatory care process.  He is instructed on elevation range of motion and other measures as outlined.  He will wait until Monday to restart his Coumadin given his issues of bleeding in the past.  Signed: Willa Frater III 03/21/2019, 8:06 AM

## 2019-04-14 ENCOUNTER — Ambulatory Visit: Payer: Medicare Other | Admitting: Internal Medicine

## 2019-04-28 ENCOUNTER — Ambulatory Visit: Payer: Medicare Other | Admitting: Oncology

## 2019-05-28 ENCOUNTER — Ambulatory Visit: Payer: Medicare Other | Admitting: Internal Medicine

## 2019-06-01 ENCOUNTER — Telehealth: Payer: Self-pay | Admitting: *Deleted

## 2019-06-01 NOTE — Telephone Encounter (Signed)
Called to cancel appointment for tomorrow with Dr. Benay Spice. He is seeing Dr. Amedeo Plenty to f/u on his surgical site/pathology discussion. They will call back to reschedule based on Dr. Vanetta Shawl assessment tomorrow.

## 2019-06-02 ENCOUNTER — Inpatient Hospital Stay: Payer: Medicare Other | Admitting: Oncology

## 2019-06-30 ENCOUNTER — Other Ambulatory Visit: Payer: Self-pay | Admitting: Nurse Practitioner

## 2019-06-30 DIAGNOSIS — C44622 Squamous cell carcinoma of skin of right upper limb, including shoulder: Secondary | ICD-10-CM

## 2019-07-08 ENCOUNTER — Encounter: Payer: Self-pay | Admitting: General Surgery

## 2019-07-08 ENCOUNTER — Other Ambulatory Visit: Payer: Self-pay

## 2019-07-08 ENCOUNTER — Ambulatory Visit (INDEPENDENT_AMBULATORY_CARE_PROVIDER_SITE_OTHER): Payer: Medicare Other | Admitting: General Surgery

## 2019-07-08 VITALS — BP 95/57 | HR 80 | Temp 97.8°F | Resp 18 | Ht 69.0 in | Wt 274.0 lb

## 2019-07-08 DIAGNOSIS — C4492 Squamous cell carcinoma of skin, unspecified: Secondary | ICD-10-CM | POA: Diagnosis not present

## 2019-07-08 NOTE — Progress Notes (Signed)
Charles Chambers; [id number]; September 15, 1936   HPI Patient is an 83 year old white male who was referred to my care by Dr. Macarthur Critchley in [facility] for evaluation treatment of a right axillary mass.  An ultrasound was done in [place] which revealed this to be possibly malignant in nature.  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  There is a question as to whether this is a metastatic squamous cell carcinoma nodule.  Patient has multiple medical problems including congestive heart failure and atrial fibrillation, currently on Coumadin.  He has had surgery on his right thumb which included an amputation by Dr. Amedeo Plenty in [place] recently.  There is a question of whether there is residual cancer in the right hand.  Patient is deconditioned and walks with a walker.  He currently has no pain. Past Medical History:  Diagnosis Date  . Abnormal gait   . Atrial fibrillation ([place])   . CAD (coronary artery disease)   . Cancer ([place]) 07/11/2016   of parotid gland, s/p parotidectomy  . Chronic back pain   . Complication of anesthesia    supraclavicular block caused trouble breathing and heartrate dropped  . Congestive heart failure with left ventricular diastolic dysfunction, NYHA class 2 ([place])   . Diabetes mellitus    INSULIN DEPENDENT, CONTROLLED Type 2  . Diabetic neuropathy ([place])   . Diabetic neuropathy ([place])   . Diverticulosis of colon   . Hx of colonoscopy    10 YEARS AGO  . Hx of nonmelanoma skin cancer   . Hx of osteomyelitis   . Hyperlipidemia   . Hypertension    BENIGN ESSENTIAL  . Lower extremity venous stasis   . MI (myocardial infarction) ([place]) 09/06/1995  . Mild renal insufficiency    decreased kidney function  . Moderate mitral regurgitation   . Morbid obesity with BMI of 40.0-44.9, adult ([place])   . Obstructive sleep apnea on CPAP    does not wear CPAP, wears 2L O2 [place]  . Osteoarthritis, multiple sites   . Osteomyelitis of vertebra of  lumbar region ([place])   . Pneumonia   . Status post coronary artery stent placement 09/09/1995  . Vitamin D deficiency     Past Surgical History:  Procedure Laterality Date  . AMPUTATION Right 06/05/2018   Procedure: RIGHT THUMB REVISION AMPUTATION;  Surgeon: Charlotte Crumb, MD;  Location: [facility];  Service: Orthopedics;  Laterality: Right;  . AMPUTATION Right 03/20/2019   Procedure: Right thumb amputation with neurolysis and rotation flap coverage as necessary;  Surgeon: Roseanne Kaufman, MD;  Location: [facility];  Service: Orthopedics;  Laterality: Right;  60 mins  . ANGIOPLASTY  08/1995   STENT PLACEMENT  . ARM/ELBOW SURGERY  2016  . BACK SURGERY    . CORONARY STENT PLACEMENT  09/09/1995  . I&D EXTREMITY Right 03/23/2018   Procedure: IRRIGATION AND DEBRIDEMENT WITH INTERPHANGEAL JOINT [place] ROTATION FLAP;  Surgeon: Charlotte Crumb, MD;  Location: [facility];  Service: Orthopedics;  Laterality: Right;  . LAMINECTOMY     OPEN BIOPSY OF L4-S1  . PAROTIDECTOMY Right 06/2016  . POSTERIOR FUSION FOR OSTEOMYLITIS  11/2008  . REMOVAL OF CYST FROM EYELID  10/2010    Family History  Problem Relation Age of Onset  . Heart disease Mother 78  . Heart disease Father     Current Outpatient Medications on File Prior to Visit  Medication Sig Dispense Refill  . acetaminophen (TYLENOL) 500 MG tablet Take 1,000 mg by  mouth 2 (two) times daily as needed for moderate pain.     Marland Kitchen aspirin EC 81 MG tablet Take 81 mg by mouth daily.      Marland Kitchen atorvastatin (LIPITOR) 20 MG tablet Take 20 mg by mouth at bedtime.     . carvedilol (COREG) 25 MG tablet Take 25 mg by mouth 2 (two) times daily with a meal.     . Cholecalciferol (VITAMIN D3) 3000 UNITS TABS Take 3,000 Units by mouth daily.     . furosemide (LASIX) 20 MG tablet Take 20 mg by mouth every Monday, Wednesday, and Friday.    . hydrochlorothiazide (HYDRODIURIL) 25 MG tablet Take 25 mg by mouth daily.      . insulin lispro protamine-insulin lispro (HUMALOG  75/25) (75-25) 100 UNIT/ML SUSP Inject 17-34 Units into the skin See admin instructions. Inject 34 units SQ in the morning and inject 17 units SQ in the evening    . lisinopril (PRINIVIL,ZESTRIL) 20 MG tablet Take 20 mg by mouth daily.    . nitroGLYCERIN (NITROSTAT) 0.4 MG SL tablet Place 0.4 mg under the tongue every 5 (five) minutes as needed for chest pain.   0  . OXYGEN Inhale 2 L into the lungs See admin instructions. Use 2 L at bedtime and as needed for shortness of breath    . traMADol (ULTRAM) 50 MG tablet Take 1 tablet (50 mg total) by mouth every 6 (six) hours as needed for moderate pain. 40 tablet 0  . warfarin (COUMADIN) 5 MG tablet Take 2.5-5 mg by mouth See admin instructions. Take 5 mg in the evening Tue, Thur, and Sun after evening meals. Take 2.5 mg in the evening on Mon, Wed, Fri, and Sat    . ondansetron (ZOFRAN) 4 MG tablet Please prescribe the 8 mg disintegrating tablets (Patient not taking: Reported on 07/08/2019) 15 tablet 1   No current facility-administered medications on file prior to visit.     Allergies  Allergen Reactions  . Naproxen Sodium Other (See Comments) and Nausea And Vomiting    Due to kidney function  . Contrast Media [Iodinated Diagnostic Agents]     : No contrast media dye due to fluctuations with renal function."  . Nsaids     Poor kidney function   . Other Other (See Comments)    Narcotics cause depression, especially codeine Contrast products can not be used due to kidney function   . Baclofen Other (See Comments)    HALLUCINATIONS    . Codeine     depression  . Mushroom Extract Complex Nausea And Vomiting  . Neurontin [Gabapentin] Other (See Comments)    overstimulation    Social History   Substance and Sexual Activity  Alcohol Use No    Social History   Tobacco Use  Smoking Status Former Smoker  . Types: Cigarettes  Smokeless Tobacco Never Used  Tobacco Comment    " stopped smoking cigarettes in 1970 "    Review of Systems   Constitutional: Negative.   HENT: Negative.   Eyes: Negative.  Negative for blurred vision.  Respiratory: Positive for shortness of breath.   Cardiovascular: Negative.   Gastrointestinal: Negative.   Genitourinary: Negative.   Musculoskeletal: Positive for back pain.  Skin: Negative.   Neurological: Positive for focal weakness.  Endo/Heme/Allergies: Bruises/bleeds easily.  Psychiatric/Behavioral: Negative.     Objective   Vitals:   07/08/19 1004  BP: (!) 95/57  Pulse: 80  Resp: 18  Temp: 97.8 F (36.6 C)  SpO2: 97%    Physical Exam Vitals signs reviewed.  Constitutional:      Appearance: He is obese.  HENT:     Head: Normocephalic and atraumatic.  Cardiovascular:     Rate and Rhythm: Normal rate. Rhythm irregular.     Heart sounds: No murmur.  Pulmonary:     Effort: Pulmonary effort is normal. No respiratory distress.     Breath sounds: Normal breath sounds. No stridor. No wheezing, rhonchi or rales.  Skin:    General: Skin is warm and dry.  Neurological:     Mental Status: He is alert and oriented to person, place, and time.   Axilla: Easily palpable right axillary mass high in the axilla.  It is somewhat fixed.  Measures approximately 3 to 4 cm in greatest diameter.  It is a hard subcutaneous mass. Previous records and ultrasound report reviewed Assessment  Right axillary mass, highly suspicious for squamous cell carcinoma Plan   We will get ultrasound-guided fine-needle aspiration of the right axillary mass next week.  This will be done at [facility] on 06/14/2019.  He will hold his Coumadin on 06/10/2019.  He does not need to be bridged.

## 2019-07-14 ENCOUNTER — Other Ambulatory Visit: Payer: Self-pay

## 2019-07-14 ENCOUNTER — Ambulatory Visit (HOSPITAL_COMMUNITY)
Admission: RE | Admit: 2019-07-14 | Discharge: 2019-07-14 | Disposition: A | Discharge status: Home / Self Care | Payer: Medicare Other | Source: Ambulatory Visit | Attending: General Surgery | Admitting: General Surgery

## 2019-07-14 ENCOUNTER — Encounter (HOSPITAL_COMMUNITY): Payer: Self-pay

## 2019-07-14 DIAGNOSIS — C4492 Squamous cell carcinoma of skin, unspecified: Secondary | ICD-10-CM

## 2019-07-14 MED ORDER — LIDOCAINE HCL (PF) 2 % IJ SOLN
INTRAMUSCULAR | Status: AC
  Filled 2019-07-14: qty 10

## 2019-07-14 NOTE — Procedures (Signed)
PreOperative Dx: RIGHT axillary mass Postoperative Dx: Necrotic RIGHT axillary mass suspect lymph node Procedure:   US guided FNA RIGHT axillary mass Radiologist:  Thornton Papas Anesthesia:  2 ml of 1% lidocaine Specimen:  FNA x 3 EBL:   < 1 mL Complications: None

## 2019-07-15 LAB — CYTOLOGY - NON PAP

## 2019-07-20 ENCOUNTER — Telehealth (INDEPENDENT_AMBULATORY_CARE_PROVIDER_SITE_OTHER): Payer: Medicare Other | Admitting: General Surgery

## 2019-07-20 DIAGNOSIS — C44622 Squamous cell carcinoma of skin of right upper limb, including shoulder: Secondary | ICD-10-CM | POA: Diagnosis not present

## 2019-07-20 NOTE — Telephone Encounter (Signed)
Virtual visit performed.  Discussed path results with daughter.  It is positive for squamous cell carcinoma.  She will discuss further treatment options with oncologist.

## 2019-07-23 ENCOUNTER — Ambulatory Visit: Payer: Medicare Other | Admitting: Internal Medicine

## 2019-07-30 ENCOUNTER — Inpatient Hospital Stay: Payer: Medicare Other | Attending: Oncology | Admitting: Oncology

## 2019-07-30 DIAGNOSIS — I130 Hypertensive heart and chronic kidney disease with heart failure and stage 1 through stage 4 chronic kidney disease, or unspecified chronic kidney disease: Secondary | ICD-10-CM | POA: Insufficient documentation

## 2019-07-30 DIAGNOSIS — C773 Secondary and unspecified malignant neoplasm of axilla and upper limb lymph nodes: Secondary | ICD-10-CM | POA: Insufficient documentation

## 2019-07-30 DIAGNOSIS — N189 Chronic kidney disease, unspecified: Secondary | ICD-10-CM | POA: Insufficient documentation

## 2019-07-30 DIAGNOSIS — C44622 Squamous cell carcinoma of skin of right upper limb, including shoulder: Secondary | ICD-10-CM | POA: Insufficient documentation

## 2019-07-30 DIAGNOSIS — Z85828 Personal history of other malignant neoplasm of skin: Secondary | ICD-10-CM | POA: Insufficient documentation

## 2019-07-30 DIAGNOSIS — I4891 Unspecified atrial fibrillation: Secondary | ICD-10-CM | POA: Insufficient documentation

## 2019-07-30 DIAGNOSIS — E1122 Type 2 diabetes mellitus with diabetic chronic kidney disease: Secondary | ICD-10-CM | POA: Insufficient documentation

## 2019-07-30 NOTE — Progress Notes (Signed)
[facility] OFFICE VISIT PROGRESS NOTE  I connected with Irean Hong, daughter of Marquarius Obier, on 07/30/19 at  1:30 PM EDT by telephone and verified that I am speaking with the correct person using two identifiers.   I discussed the limitations, risks, security and privacy concerns of performing an evaluation and management service by telemedicine and the availability of in-person appointments. I also discussed with the patient that there may be a patient responsible charge related to this service. The patient expressed understanding and agreed to proceed.    Patient's location: Home Provider's location: Office   Diagnosis: Squamous cell carcinoma  INTERVAL HISTORY:   We had a telehealth visit today.  I spoke with the daughter Mr. Wark.  Mr. Finner was unable to be present due to his medical condition and distance from the Cancer center.  He underwent amputation of the right thumb and metacarpal in May of this year for recurrence of the right thumb squamous cell carcinoma.  The pathology confirmed an ulcerated moderately differentiated squamous cell carcinoma involving the skeletal muscle and bone.  Carcinoma was present at the soft tissue resection margin.  Ms. Norma Fredrickson reports Mr. Gruver noted a mass in the right axilla on 06/08/2019.  He saw his primary physician.  An ultrasound of the right axilla on 06/23/2019 revealed a complex mass in the right axilla measuring 4.6 x 3.6 cm.  There are cystic and solid components.. He was referred for CTs of the chest and neck on 9020.  The CT of the neck revealed right axillary masses suspicious for metastatic lymph nodes.  No the CT of the neck revealed postsurgical changes of the right parotid gland with skin thickening, similar to a CT from September 2017.  No suspicious adenopathy. He underwent ultrasound-guided biopsy of a right axillary lymph node 07/14/2019.  The cytology revealed  malignant cells consistent with squamous cell carcinoma.  Ms. Norma Fredrickson reports Mr. Fahnestock has pain in the right axilla and at the right hand.  She says there is again recurrence of tumor at the right hand.  She has communicated with Dr. Amedeo Plenty and reports sending pictures of the hand to Dr. Amedeo Plenty.  There is some bleeding from the hand.  Mr. Canfield has decreased mobility.   Lab Results:  Lab Results  Component Value Date   WBC 5.8 06/05/2018   HGB 13.6 06/05/2018   HCT 42.1 06/05/2018   MCV 95.9 06/05/2018   PLT 165 06/05/2018    Medications: I have reviewed the patient's current medications.  Assessment/Plan: 1. 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  Pathology showed squamous cell carcinoma; positive for cytokeratin 5/6 and p63.    06/05/2018 status post excision of recurrent right thumb squamous cell carcinoma with distal interphalangeal joint disarticulation and volar advancement flap.  Pathology showed invasive keratinizing squamous cell carcinoma, moderately differentiated, spanning 4.6 cm.  Carcinoma was broadly present at the resection margin of the larger piece of tissue.  There was carcinoma involvement of bone.  Radiation to the right thumb 09/16/2016 through 10/01/2016, 2400 cGy  Recurrent disease involving the proximal right thumb, soft tissue, and metacarpal head, status post amputation involving the metacarpal bone and soft tissue mass, loop anastomosis of the radial digital nerve/ulnar nerve, rotational flap closure of the right thumb 03/20/2019  Pathology of the right thumb amputation 03/20/2019-invasive moderately differentiated squamous  cell carcinoma invading skeletal muscle and bone, carcinoma present at the soft tissue resection margin  Right axillary mass August 2020  Right axillary ultrasound 06/23/2019-complex  right axillary mass  CTs neck and chest 07/02/2019-right axillary lymph nodes, no other evidence of metastatic disease  Ultrasound-guided biopsy of right axillary mass on 07/14/2019-squamous cell carcinoma  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  Pathology showed squamous cell carcinoma 3.5 cm, intermediate grade, 0.2 cm from the nearest inked margin; perineural invasion present; lymphovascular invasion not observed; 4 lymph nodes uninvolved with tumor.  Cannot completely rule out a metastatic lesion. 3. History of multiple squamous cell skin cancers 4. Atrial fibrillation 5. Coumadin anticoagulation secondary to #4 6. Hypertension 7. Diabetes mellitus 8. Chronic renal failure 9. CHF 10. History of osteomyelitis of the spine     Disposition: Mr. 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  He was diagnosed with squamous cell carcinoma of the right thumb and June 2019.  It is probable these are separate primary lesions, though the parotid lesion could have been a metastasis.  He now has recurrent disease at the right hand and in the right axilla.  I discussed treatment options with Ms. Hutchens.  She understands it is possible a right hand amputation and axillary dissection could be curative.  It would be difficult for Mr. Monegro to undergo these procedures and there would be no guarantee of cure.  We can also consider palliative systemic therapy, most likely with a PD1 inhibitor.  She expressed the difficulty in transporting Mr. Kowalchuk the long distance to [place].  He may be able to have treatment at [facility] as this is closer to home.  She agrees to a follow-up visit here on 08/18/2019.  She also plans to follow-up with Dr. Amedeo Plenty.  He will take tramadol as needed for pain.  Ms. Norma Fredrickson will contact me if tramadol does  not relieve his pain or he has new symptoms in the interim.   I discussed the assessment and treatment plan with the patient. The patient was provided an opportunity to ask questions and all were answered. The patient agreed with the plan and demonstrated an understanding of the instructions.   The patient was advised to call back or seek an in-person evaluation if the symptoms worsen or if the condition fails to improve as anticipated.  I provided 30 minutes of telephone and chart review time today, and > 50% was spent counseling as documented under my assessment & plan.  Betsy Coder ANP/GNP-BC   07/30/2019 1:29 PM

## 2019-08-02 ENCOUNTER — Telehealth: Payer: Self-pay | Admitting: Oncology

## 2019-08-02 NOTE — Telephone Encounter (Signed)
Returned patient's phone call regarding rescheduling an appointment, per patient's daughter's request 10/20 needs to be cancelled due to no transportation. I will give patient a cal back once I get further instructions from provider.

## 2019-08-03 ENCOUNTER — Telehealth: Payer: Self-pay | Admitting: Oncology

## 2019-08-03 NOTE — Telephone Encounter (Signed)
Returned patient's phone call regarding scheduling an appointment, per providers approval a follow-up has been made on 10/28. Patient called and notified.

## 2019-08-03 NOTE — Telephone Encounter (Signed)
Opened by accident

## 2019-08-10 ENCOUNTER — Ambulatory Visit: Payer: Medicare Other | Admitting: Oncology

## 2019-08-18 ENCOUNTER — Inpatient Hospital Stay (HOSPITAL_BASED_OUTPATIENT_CLINIC_OR_DEPARTMENT_OTHER): Payer: Medicare Other | Admitting: Oncology

## 2019-08-18 ENCOUNTER — Other Ambulatory Visit: Payer: Self-pay

## 2019-08-18 ENCOUNTER — Telehealth: Payer: Self-pay | Admitting: Oncology

## 2019-08-18 VITALS — BP 122/69 | HR 66 | Temp 98.3°F | Resp 18 | Ht 69.0 in | Wt 274.0 lb

## 2019-08-18 DIAGNOSIS — Z7189 Other specified counseling: Secondary | ICD-10-CM

## 2019-08-18 DIAGNOSIS — I130 Hypertensive heart and chronic kidney disease with heart failure and stage 1 through stage 4 chronic kidney disease, or unspecified chronic kidney disease: Secondary | ICD-10-CM | POA: Diagnosis not present

## 2019-08-18 DIAGNOSIS — R5381 Other malaise: Secondary | ICD-10-CM | POA: Diagnosis not present

## 2019-08-18 DIAGNOSIS — C44622 Squamous cell carcinoma of skin of right upper limb, including shoulder: Secondary | ICD-10-CM

## 2019-08-18 DIAGNOSIS — I4891 Unspecified atrial fibrillation: Secondary | ICD-10-CM | POA: Diagnosis not present

## 2019-08-18 DIAGNOSIS — Z85828 Personal history of other malignant neoplasm of skin: Secondary | ICD-10-CM | POA: Diagnosis not present

## 2019-08-18 DIAGNOSIS — C773 Secondary and unspecified malignant neoplasm of axilla and upper limb lymph nodes: Secondary | ICD-10-CM | POA: Diagnosis not present

## 2019-08-18 DIAGNOSIS — E1122 Type 2 diabetes mellitus with diabetic chronic kidney disease: Secondary | ICD-10-CM | POA: Diagnosis not present

## 2019-08-18 DIAGNOSIS — N189 Chronic kidney disease, unspecified: Secondary | ICD-10-CM | POA: Diagnosis not present

## 2019-08-18 NOTE — Progress Notes (Signed)
START ON PATHWAY REGIMEN - Melanoma and Other Skin Cancers     A cycle is every 21 days:     Cemiplimab-rwlc   **Always confirm dose/schedule in your pharmacy ordering system**  Patient Characteristics: Cutaneous Squamous Cell Carcinoma, Locally Advanced - Unresectable, Systemic Therapy Indicated Disease Classification: Cutaneous Squamous Cell Carcinoma Therapeutic Status: Locally Advanced - Unresectable Click here if multiple primary tumors are present.: false Intent of Therapy: Non-Curative / Palliative Intent, Discussed with Patient 

## 2019-08-18 NOTE — Telephone Encounter (Signed)
Gave dtr avs report and appointments for October and November. Patient education will be done via telephone with dtr. Dates for infusion scheduled per 10/28 los. No care plan in place. Checked with pharmacy for infusion duration.

## 2019-08-18 NOTE — Progress Notes (Signed)
[facility] OFFICE PROGRESS NOTE   Diagnosis: Squamous cell carcinoma  INTERVAL HISTORY:   Mr. Neufer returns for a scheduled visit.  He is here today with his daughter.  He has developed progressive nodularity at the base of the right thumb extending to the right wrist area.  He also has enlarging masses in the right axilla.  He has pain associated with the more proximal wrist mass. Mr. Parchman saw Dr. Amedeo Plenty earlier today.  His daughter reports Dr. Amedeo Plenty will consider an amputation of part of the right hand.  Mr. Crossen has a good appetite.  He has intermittent pain at the right lower neck.  No other complaint.  He has chronic difficulty ambulating.  Objective:  Vital signs in last 24 hours:  Blood pressure 122/69, pulse 66, temperature 98.3 F (36.8 C), temperature source Temporal, resp. rate 18, height 5\' 9"  (1.753 m), weight 274 lb (124.3 kg), SpO2 97 %.    HEENT: Postsurgical changes at the right neck, no mass Lymphatics: No cervical, supraclavicular, left axillary, or inguinal nodes.  Bulky nodal mass in the medial right axilla with a 2-3 cm more lateral mass eroding through the skin Resp: Clear bilaterally Cardio: Irregular GI: No hepatomegaly, nontender Vascular: No leg edema Neuro: Alert and oriented Skin: Multiple keratoses versus early squamous cell carcinomas at the left forearm, lateral right fifth finger, and between the base of the left third and fourth finger, 2 cm area of raised nodularity at the right wrist, 3 cm area of ulcerated nodularity at the base of the medial right hand   Lab Results:  Lab Results  Component Value Date   WBC 5.8 06/05/2018   HGB 13.6 06/05/2018   HCT 42.1 06/05/2018   MCV 95.9 06/05/2018   PLT 165 06/05/2018    CMP  Lab Results  Component Value Date   NA 131 (L) 03/21/2019   K 4.6 03/21/2019   CL 96 (L) 03/21/2019   CO2 25 03/21/2019   GLUCOSE 251 (H) 03/21/2019   BUN 32 (H) 03/21/2019   CREATININE 1.32 (H)  03/21/2019   CALCIUM 8.6 (L) 03/21/2019   GFRNONAA 51 (L) 03/21/2019   GFRAA 59 (L) 03/21/2019    Medications: I have reviewed the patient's current medications.   Assessment/Plan: 1. 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  Pathology showed squamous cell carcinoma; positive for cytokeratin 5/6 and p63.    06/05/2018 status post excision of recurrent right thumb squamous cell carcinoma with distal interphalangeal joint disarticulation and volar advancement flap.  Pathology showed invasive keratinizing squamous cell carcinoma, moderately differentiated, spanning 4.6 cm.  Carcinoma was broadly present at the resection margin of the larger piece of tissue.  There was carcinoma involvement of bone.  Radiation to the right thumb 09/16/2016 through 10/01/2016, 2400 cGy  Recurrent disease involving the proximal right thumb, soft tissue, and metacarpal head, status post amputation involving the metacarpal bone and soft tissue mass, loop anastomosis of the radial digital nerve/ulnar nerve, rotational flap closure of the right thumb 03/20/2019  Pathology of the right thumb amputation 03/20/2019-invasive moderately differentiated squamous cell carcinoma invading skeletal muscle and bone, carcinoma present at the soft tissue resection margin  Right axillary mass August 2020  Right axillary ultrasound 06/23/2019-complex right axillary mass  CTs neck and chest 07/02/2019-right axillary lymph nodes, no other evidence of metastatic disease  Ultrasound-guided biopsy of right  axillary mass on 07/14/2019-squamous cell carcinoma  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  Pathology showed squamous cell carcinoma 3.5 cm, intermediate  grade, 0.2 cm from the nearest inked margin; perineural invasion present; lymphovascular invasion not observed; 4 lymph nodes uninvolved with tumor.  Cannot completely rule out a metastatic lesion. 3. History of multiple squamous cell skin cancers 4. Atrial fibrillation 5. Coumadin anticoagulation secondary to #4 6. Hypertension 7. Diabetes mellitus 8. Chronic renal failure 9. CHF 10. History of osteomyelitis of the spine      Disposition: Mr. Koopmann has been diagnosed with recurrent squamous cell carcinoma involving a right axillary nodal mass.  Physical examination is consistent with metastatic right axillary lymphadenopathy and local disease recurrence at the base of the right hand and distal right wrist.  He also has multiple skin lesions that appear to represent primary squamous cell carcinomas.  The parotid tumor from 2017 may have been related to metastasis from the right hand.  I discussed treatment options with Mr. Docherty and his daughter.  I will discuss the case with Dr. Amedeo Plenty.  It would require an extensive surgical procedure at the right hand and a right axillary dissection to render him disease-free.  It is unlikely surgery will be curative. I recommend systemic therapy with a PD1 inhibitor, cemiplimab.  There is a significant months rate with PD1 inhibitors in patients with squamous cell carcinoma of the skin and these responses can be durable.  Mr. Jollie and his daughter are in agreement to proceed with cemiplimab.  We discussed potential toxicities associated with PD1 inhibitors including the chance of a rash, diarrhea, hepatitis, pneumonitis, and various autoimmune toxicities.  He agrees to proceed.  He will be scheduled for a chemotherapy teaching class via telephone.  The plan is to begin cemiplimab on 08/25/2019.  He will return for an office visit prior to cycle 2 on 09/14/2019.  45 minutes were spent with the patient today.  The majority of the time was used for  counseling and coordination of care. 08/18/2019  11:55 AM

## 2019-08-20 ENCOUNTER — Inpatient Hospital Stay: Payer: Medicare Other

## 2019-08-22 ENCOUNTER — Other Ambulatory Visit: Payer: Self-pay | Admitting: Oncology

## 2019-08-25 ENCOUNTER — Other Ambulatory Visit: Payer: Self-pay

## 2019-08-25 ENCOUNTER — Inpatient Hospital Stay: Payer: Medicare Other | Attending: Oncology

## 2019-08-25 ENCOUNTER — Inpatient Hospital Stay: Payer: Medicare Other

## 2019-08-25 VITALS — BP 120/71 | HR 67 | Temp 98.3°F | Resp 20

## 2019-08-25 DIAGNOSIS — N189 Chronic kidney disease, unspecified: Secondary | ICD-10-CM | POA: Diagnosis not present

## 2019-08-25 DIAGNOSIS — I130 Hypertensive heart and chronic kidney disease with heart failure and stage 1 through stage 4 chronic kidney disease, or unspecified chronic kidney disease: Secondary | ICD-10-CM | POA: Diagnosis not present

## 2019-08-25 DIAGNOSIS — I4891 Unspecified atrial fibrillation: Secondary | ICD-10-CM | POA: Insufficient documentation

## 2019-08-25 DIAGNOSIS — Z79899 Other long term (current) drug therapy: Secondary | ICD-10-CM | POA: Diagnosis not present

## 2019-08-25 DIAGNOSIS — C44622 Squamous cell carcinoma of skin of right upper limb, including shoulder: Secondary | ICD-10-CM

## 2019-08-25 DIAGNOSIS — E1122 Type 2 diabetes mellitus with diabetic chronic kidney disease: Secondary | ICD-10-CM | POA: Insufficient documentation

## 2019-08-25 DIAGNOSIS — Z5112 Encounter for antineoplastic immunotherapy: Secondary | ICD-10-CM | POA: Insufficient documentation

## 2019-08-25 DIAGNOSIS — R5381 Other malaise: Secondary | ICD-10-CM

## 2019-08-25 LAB — CBC WITH DIFFERENTIAL (CANCER CENTER ONLY)
Abs Immature Granulocytes: 0.02 10*3/uL (ref 0.00–0.07)
Basophils Absolute: 0 10*3/uL (ref 0.0–0.1)
Basophils Relative: 0 %
Eosinophils Absolute: 0.3 10*3/uL (ref 0.0–0.5)
Eosinophils Relative: 4 %
HCT: 38.2 % — ABNORMAL LOW (ref 39.0–52.0)
Hemoglobin: 12.3 g/dL — ABNORMAL LOW (ref 13.0–17.0)
Immature Granulocytes: 0 %
Lymphocytes Relative: 8 %
Lymphs Abs: 0.6 10*3/uL — ABNORMAL LOW (ref 0.7–4.0)
MCH: 31.3 pg (ref 26.0–34.0)
MCHC: 32.2 g/dL (ref 30.0–36.0)
MCV: 97.2 fL (ref 80.0–100.0)
Monocytes Absolute: 0.7 10*3/uL (ref 0.1–1.0)
Monocytes Relative: 11 %
Neutro Abs: 5.4 10*3/uL (ref 1.7–7.7)
Neutrophils Relative %: 77 %
Platelet Count: 162 10*3/uL (ref 150–400)
RBC: 3.93 MIL/uL — ABNORMAL LOW (ref 4.22–5.81)
RDW: 13.6 % (ref 11.5–15.5)
WBC Count: 7 10*3/uL (ref 4.0–10.5)
nRBC: 0 % (ref 0.0–0.2)

## 2019-08-25 LAB — CMP (CANCER CENTER ONLY)
ALT: 11 U/L (ref 0–44)
AST: 10 U/L — ABNORMAL LOW (ref 15–41)
Albumin: 3.4 g/dL — ABNORMAL LOW (ref 3.5–5.0)
Alkaline Phosphatase: 68 U/L (ref 38–126)
Anion gap: 10 (ref 5–15)
BUN: 28 mg/dL — ABNORMAL HIGH (ref 8–23)
CO2: 28 mmol/L (ref 22–32)
Calcium: 9.4 mg/dL (ref 8.9–10.3)
Chloride: 99 mmol/L (ref 98–111)
Creatinine: 1.11 mg/dL (ref 0.61–1.24)
GFR, Est AFR Am: >60 mL/min (ref >60)
GFR, Estimated: >60 mL/min (ref >60)
Glucose, Bld: 163 mg/dL — ABNORMAL HIGH (ref 70–99)
Potassium: 4.2 mmol/L (ref 3.5–5.1)
Sodium: 137 mmol/L (ref 135–145)
Total Bilirubin: 0.5 mg/dL (ref 0.3–1.2)
Total Protein: 6.7 g/dL (ref 6.5–8.1)

## 2019-08-25 LAB — TSH: TSH: 2.648 u[IU]/mL (ref 0.320–4.118)

## 2019-08-25 MED ORDER — SODIUM CHLORIDE 0.9 % IV SOLN
Freq: Once | INTRAVENOUS | Status: AC
  Administered 2019-08-25: 14:00:00 via INTRAVENOUS
  Filled 2019-08-25: qty 250

## 2019-08-25 MED ORDER — SODIUM CHLORIDE 0.9 % IV SOLN
350.0000 mg | Freq: Once | INTRAVENOUS | Status: AC
  Administered 2019-08-25: 15:00:00 350 mg via INTRAVENOUS
  Filled 2019-08-25: qty 7

## 2019-08-25 NOTE — Patient Instructions (Addendum)
[facility] Discharge Instructions for Patients Receiving Chemotherapy  Today you received the following chemotherapy agents Cemiplimab/Libtayo  To help prevent nausea and vomiting after your treatment, we encourage you to take your nausea medication as directed.    If you develop nausea and vomiting that is not controlled by your nausea medication, call the clinic.   BELOW ARE SYMPTOMS THAT SHOULD BE REPORTED IMMEDIATELY:  *FEVER GREATER THAN 100.5 F  *CHILLS WITH OR WITHOUT FEVER  NAUSEA AND VOMITING THAT IS NOT CONTROLLED WITH YOUR NAUSEA MEDICATION  *UNUSUAL SHORTNESS OF BREATH  *UNUSUAL BRUISING OR BLEEDING  TENDERNESS IN MOUTH AND THROAT WITH OR WITHOUT PRESENCE OF ULCERS  *URINARY PROBLEMS  *BOWEL PROBLEMS  UNUSUAL RASH Items with * indicate a potential emergency and should be followed up as soon as possible.  Feel free to call the clinic should you have any questions or concerns. The clinic phone number is [phone].  Please show the [facility] at check-in to the Emergency Department and triage nurse.  Cemiplimab injection What is this medicine? CEMIPLIMAB (se mip li mab) is a monoclonal antibody. It is used to treat cutaneous squamous cell carcinoma. This medicine may be used for other purposes; ask your health care provider or pharmacist if you have questions. COMMON BRAND NAME(S): LIBTAYO What should I tell my health care provider before I take this medicine? They need to know if you have any of these conditions:  diabetes  immune system problems like lupus  inflammatory bowel disease  kidney disease  liver disease  lung or breathing disease  organ transplant  an unusual or allergic reaction to cemiplimab, other medicines, foods, dyes, or preservatives  pregnant or trying to get pregnant  breast-feeding How should I use this medicine? This medicine is for infusion into a vein. It is given by a health care professional  in a hospital or clinic setting. A special MedGuide will be given to you before each treatment. Be sure to read this information carefully each time. Talk to your pediatrician regarding the use of this medicine in children. Special care may be needed. Overdosage: If you think you have taken too much of this medicine contact a poison control center or emergency room at once. NOTE: This medicine is only for you. Do not share this medicine with others. What if I miss a dose? It is important not to miss your dose. Call your doctor or health care professional if you are unable to keep an appointment. What may interact with this medicine? Interactions have not been studied. Give your health care provider a list of all the medicines, herbs, non-prescription drugs, or dietary supplements you use. Also tell them if you smoke, drink alcohol, or use illegal drugs. Some items may interact with your medicine. This list may not describe all possible interactions. Give your health care provider a list of all the medicines, herbs, non-prescription drugs, or dietary supplements you use. Also tell them if you smoke, drink alcohol, or use illegal drugs. Some items may interact with your medicine. What should I watch for while using this medicine? Your condition will be monitored carefully while you are receiving this medicine. You may need blood work done while you are taking this medicine. Do not become pregnant while taking this medicine or for at least 4 months after stopping it. Women should inform their doctor if they wish to become pregnant or think they might be pregnant. There is a potential for serious side effects to  an unborn child. Talk to your health care professional or pharmacist for more information. Do not breast-feed an infant while taking this medicine or for at least 4 months after the last dose. What side effects may I notice from receiving this medicine? Side effects that you should report to your  doctor or health care professional as soon as possible:  allergic reactions like skin rash, itching or hives; swelling of the face, lips, or tongue  black, tarry stools  bloody or watery diarrhea  breathing problems  changes in vision  changes in voice  chest pain or chest tightness  chills  cough  dizziness  fast or irregular heart beat  feeling faint or lightheaded  hair loss  increased hunger or thirst  muscle weakness  persistent headache  redness, blistering, peeling or loosening of the skin, including inside the mouth  signs and symptoms of kidney injury like trouble passing urine or change in the amount of urine  signs and symptoms of liver injury like dark yellow or brown urine; general ill feeling or flu-like symptoms; light-colored stools; loss of appetite; nausea; right upper belly pain; unusually weak or tired; yellowing of the eyes or skin  stomach pain  unusual bleeding or bruising  weight gain or weight loss  unusual sweating Side effects that usually do not require medical attention (report these to your doctor or health care professional if they continue or are bothersome):  constipation  muscle pain  tiredness This list may not describe all possible side effects. Call your doctor for medical advice about side effects. You may report side effects to FDA at 1-800-FDA-1088. Where should I keep my medicine? This drug is given in a hospital or clinic and will not be stored at home. NOTE: This sheet is a summary. It may not cover all possible information. If you have questions about this medicine, talk to your doctor, pharmacist, or health care provider.  2020 Elsevier/Gold Standard (2017-07-23 09:25:58)

## 2019-08-26 ENCOUNTER — Telehealth: Payer: Self-pay | Admitting: *Deleted

## 2019-08-26 NOTE — Telephone Encounter (Signed)
Called pt & left message to call back with any concerns/questions.

## 2019-08-26 NOTE — Telephone Encounter (Signed)
-----   Message from Veverly Fells, RN sent at 08/25/2019  3:35 PM EST ----- Regarding: Charles Chambers: First time follow-up First time Libtayo today. Patient tolerated well with no complaints. Patient lives alone. Daughter has POA and did the chemo education class with him and [name].

## 2019-09-06 ENCOUNTER — Telehealth: Payer: Self-pay | Admitting: *Deleted

## 2019-09-06 NOTE — Telephone Encounter (Addendum)
Reports the tumor under his arm looks much larger and is bleeding now. Also the tumor on his hand looks worse, saying "it's a hot mess". Asking for guidance. Does Dr. Benay Spice need to see it this week? Next appointment 11/17 w/cemiplimab. Per Dr.Sherrill: common to have tumor flare before you see improvement. Could try palliative RT here or in [place] or surgical intervention possibly. Can come in this week if desired to assess areas. Daughter was reassured to know this change can be normal. She will continue to try local wound care unless something changes and will then call again. Not comfortable with more RT.

## 2019-09-12 ENCOUNTER — Other Ambulatory Visit: Payer: Self-pay | Admitting: Oncology

## 2019-09-14 ENCOUNTER — Inpatient Hospital Stay: Payer: Medicare Other

## 2019-09-14 ENCOUNTER — Inpatient Hospital Stay (HOSPITAL_BASED_OUTPATIENT_CLINIC_OR_DEPARTMENT_OTHER): Payer: Medicare Other | Admitting: Oncology

## 2019-09-14 ENCOUNTER — Encounter: Payer: Self-pay | Admitting: Oncology

## 2019-09-14 ENCOUNTER — Telehealth: Payer: Self-pay | Admitting: Oncology

## 2019-09-14 ENCOUNTER — Other Ambulatory Visit: Payer: Self-pay

## 2019-09-14 VITALS — BP 108/66 | HR 71 | Temp 98.2°F | Resp 17 | Ht 69.0 in | Wt 270.9 lb

## 2019-09-14 DIAGNOSIS — C44622 Squamous cell carcinoma of skin of right upper limb, including shoulder: Secondary | ICD-10-CM | POA: Diagnosis not present

## 2019-09-14 DIAGNOSIS — Z5112 Encounter for antineoplastic immunotherapy: Secondary | ICD-10-CM | POA: Diagnosis not present

## 2019-09-14 LAB — CBC WITH DIFFERENTIAL (CANCER CENTER ONLY)
Abs Immature Granulocytes: 0.02 10*3/uL (ref 0.00–0.07)
Basophils Absolute: 0.1 10*3/uL (ref 0.0–0.1)
Basophils Relative: 1 %
Eosinophils Absolute: 0.3 10*3/uL (ref 0.0–0.5)
Eosinophils Relative: 3 %
HCT: 38.6 % — ABNORMAL LOW (ref 39.0–52.0)
Hemoglobin: 12.1 g/dL — ABNORMAL LOW (ref 13.0–17.0)
Immature Granulocytes: 0 %
Lymphocytes Relative: 8 %
Lymphs Abs: 0.7 10*3/uL (ref 0.7–4.0)
MCH: 30.6 pg (ref 26.0–34.0)
MCHC: 31.3 g/dL (ref 30.0–36.0)
MCV: 97.7 fL (ref 80.0–100.0)
Monocytes Absolute: 1.1 10*3/uL — ABNORMAL HIGH (ref 0.1–1.0)
Monocytes Relative: 13 %
Neutro Abs: 6.1 10*3/uL (ref 1.7–7.7)
Neutrophils Relative %: 75 %
Platelet Count: 185 10*3/uL (ref 150–400)
RBC: 3.95 MIL/uL — ABNORMAL LOW (ref 4.22–5.81)
RDW: 13.5 % (ref 11.5–15.5)
WBC Count: 8.1 10*3/uL (ref 4.0–10.5)
nRBC: 0 % (ref 0.0–0.2)

## 2019-09-14 LAB — CMP (CANCER CENTER ONLY)
ALT: 13 U/L (ref 0–44)
AST: 13 U/L — ABNORMAL LOW (ref 15–41)
Albumin: 3.4 g/dL — ABNORMAL LOW (ref 3.5–5.0)
Alkaline Phosphatase: 66 U/L (ref 38–126)
Anion gap: 11 (ref 5–15)
BUN: 34 mg/dL — ABNORMAL HIGH (ref 8–23)
CO2: 28 mmol/L (ref 22–32)
Calcium: 9.9 mg/dL (ref 8.9–10.3)
Chloride: 97 mmol/L — ABNORMAL LOW (ref 98–111)
Creatinine: 1.1 mg/dL (ref 0.61–1.24)
GFR, Est AFR Am: >60 mL/min (ref >60)
GFR, Estimated: >60 mL/min (ref >60)
Glucose, Bld: 140 mg/dL — ABNORMAL HIGH (ref 70–99)
Potassium: 5.3 mmol/L — ABNORMAL HIGH (ref 3.5–5.1)
Sodium: 136 mmol/L (ref 135–145)
Total Bilirubin: 0.5 mg/dL (ref 0.3–1.2)
Total Protein: 6.7 g/dL (ref 6.5–8.1)

## 2019-09-14 MED ORDER — SODIUM CHLORIDE 0.9 % IV SOLN
350.0000 mg | Freq: Once | INTRAVENOUS | Status: AC
  Administered 2019-09-14: 350 mg via INTRAVENOUS
  Filled 2019-09-14: qty 7

## 2019-09-14 MED ORDER — SODIUM CHLORIDE 0.9 % IV SOLN
Freq: Once | INTRAVENOUS | Status: AC
  Administered 2019-09-14: 14:00:00 via INTRAVENOUS
  Filled 2019-09-14: qty 250

## 2019-09-14 NOTE — Progress Notes (Signed)
[place] OFFICE PROGRESS NOTE   Diagnosis: Squamous cell carcinoma  INTERVAL HISTORY:   Mr. Alarie pleaded a cycle of Cemiplimab on 08/25/2019.  No rash or diarrhea.  He reports no apparent side effects from the treatment.  He is here today with his daughter.  The tumor at the right hand has become ulcerated.  Tumor in the right axilla has increased in size and is draining.  He reports minimal pain.  Pain is relieved with Tylenol twice daily.  Mild bleeding from the right hand tumor.  Objective:  Vital signs in last 24 hours:  Blood pressure 108/66, pulse 71, temperature 98.2 F (36.8 C), temperature source Temporal, resp. rate 17, height 5\' 9"  (1.753 m), weight 270 lb 14.4 oz (122.9 kg), SpO2 96 %.   Limited physical examination secondary to distancing with the Covid pandemic Cardio: Distant heart sounds, and audible, regular peripheral pulse GI: No hepatomegaly, nontender Vascular: No leg edema  Skin: Scaling and hyperpigmentation of the lower leg bilaterally Musculoskeletal: 2 areas of ulcerated tumor at the dorsum of the proximal right hand, larger area measures approximately 3-4 cm in diameter with surrounding induration.  Large nodal mass at the right axilla.  Superior to the larger mass there is a 2 to 3 cm open wound with an underlying nodal mass   Lab Results:  Lab Results  Component Value Date   WBC 8.1 09/14/2019   HGB 12.1 (L) 09/14/2019   HCT 38.6 (L) 09/14/2019   MCV 97.7 09/14/2019   PLT 185 09/14/2019   NEUTROABS 6.1 09/14/2019    CMP  Lab Results  Component Value Date   NA 136 09/14/2019   K 5.3 (H) 09/14/2019   CL 97 (L) 09/14/2019   CO2 28 09/14/2019   GLUCOSE 140 (H) 09/14/2019   BUN 34 (H) 09/14/2019   CREATININE 1.10 09/14/2019   CALCIUM 9.9 09/14/2019   PROT 6.7 09/14/2019   ALBUMIN 3.4 (L) 09/14/2019   AST 13 (L) 09/14/2019   ALT 13 09/14/2019   ALKPHOS 66 09/14/2019   BILITOT 0.5 09/14/2019   GFRNONAA >60 09/14/2019   GFRAA >60 09/14/2019    Medications: I have reviewed the patient's current medications.   Assessment/Plan: 1. 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  Pathology showed squamous cell carcinoma; positive for cytokeratin 5/6 and p63.    06/05/2018 status post excision of recurrent right thumb squamous cell carcinoma with distal interphalangeal joint disarticulation and volar advancement flap.  Pathology showed invasive keratinizing squamous cell carcinoma, moderately differentiated, spanning 4.6 cm.  Carcinoma was broadly present at the resection margin of the larger piece of tissue.  There was carcinoma involvement of bone.  Radiation to the right thumb 09/16/2016 through 10/01/2016, 2400 cGy  Recurrent disease involving the proximal right thumb, soft tissue, and metacarpal head, status post amputation involving the metacarpal bone and soft tissue mass, loop anastomosis of the radial digital nerve/ulnar nerve, rotational flap closure of the right thumb 03/20/2019  Pathology of the right thumb amputation 03/20/2019-invasive moderately differentiated squamous cell carcinoma invading skeletal muscle and bone, carcinoma present at the soft tissue resection margin  Right axillary mass August 2020  Right axillary ultrasound 06/23/2019-complex right axillary mass  CTs neck and chest 07/02/2019-right axillary lymph nodes, no other evidence of metastatic disease  Ultrasound-guided biopsy of right axillary mass on 07/14/2019-squamous cell carcinoma  Cycle 1 Cemiplimab  08/25/2019  Cycle 2 Cemiplimab 09/14/2019  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  Pathology showed squamous cell carcinoma 3.5 cm, intermediate grade, 0.2  cm from the nearest inked margin; perineural invasion present; lymphovascular invasion not observed; 4 lymph nodes uninvolved with tumor.  Cannot completely rule out a metastatic lesion. 3. History of multiple squamous cell skin cancers 4. Atrial fibrillation 5. Coumadin anticoagulation secondary to #4 6. Hypertension 7. Diabetes mellitus 8. Chronic renal failure 9. CHF 10. History of osteomyelitis of the spine      Disposition: Mr. Krocker tolerated the first cycle of sunitinib level.  There is persistent tumor at the proximal right hand and in the right axilla.  The lesions appear slightly larger and more necrotic.  There is an open area in the high right axilla with drainage.  He will dress the hand and axilla with gauze.  His daughter will contact us if the axillary lesion becomes more open.  We will make a surgical referral if this area worsens.  It is possible the changes seen today are related to a tumor flare or necrosis.  He will complete cycle 2  Cemiplimab today.  He will return for an office visit in 3 weeks.   The potassium level is mildly increased today.  He will have a chemistry panel checked closer to home early next week.  The result will be forwarded to [place].  Betsy Coder, MD  09/14/2019  2:11 PM

## 2019-09-14 NOTE — Progress Notes (Signed)
Per Dr. Benay Spice: OK to treat w/K+ 5.5 today.

## 2019-09-14 NOTE — Telephone Encounter (Signed)
Scheduled per MD, gave avs and calendar

## 2019-09-14 NOTE — Patient Instructions (Signed)
[facility] Discharge Instructions for Patients Receiving Chemotherapy  Today you received the following chemotherapy agents Libtayo  To help prevent nausea and vomiting after your treatment, we encourage you to take your nausea medication as directed.    If you develop nausea and vomiting that is not controlled by your nausea medication, call the clinic.   BELOW ARE SYMPTOMS THAT SHOULD BE REPORTED IMMEDIATELY:  *FEVER GREATER THAN 100.5 F  *CHILLS WITH OR WITHOUT FEVER  NAUSEA AND VOMITING THAT IS NOT CONTROLLED WITH YOUR NAUSEA MEDICATION  *UNUSUAL SHORTNESS OF BREATH  *UNUSUAL BRUISING OR BLEEDING  TENDERNESS IN MOUTH AND THROAT WITH OR WITHOUT PRESENCE OF ULCERS  *URINARY PROBLEMS  *BOWEL PROBLEMS  UNUSUAL RASH Items with * indicate a potential emergency and should be followed up as soon as possible.  Feel free to call the clinic should you have any questions or concerns. The clinic phone number is [phone].  Please show the CHEMO ALERT CARD at check-in to the Emergency Department and triage nurse.   

## 2019-09-14 NOTE — Progress Notes (Signed)
Met with patient's daughter/POA to introduce myself as Arboriculturist and to offer available resources.  Discussed one-time $35 Engineer, drilling to assist with personal expenses while going through treatment.  Gave her my card if interested in applying and for any additional financial questions or concerns.

## 2019-09-15 ENCOUNTER — Ambulatory Visit: Payer: Medicare Other | Admitting: Internal Medicine

## 2019-09-22 ENCOUNTER — Encounter: Payer: Self-pay | Admitting: Oncology

## 2019-09-24 ENCOUNTER — Telehealth: Payer: Self-pay | Admitting: *Deleted

## 2019-09-24 NOTE — Telephone Encounter (Signed)
Notified of lab results. 

## 2019-10-03 ENCOUNTER — Other Ambulatory Visit: Payer: Self-pay | Admitting: Oncology

## 2019-10-04 ENCOUNTER — Telehealth: Payer: Self-pay | Admitting: *Deleted

## 2019-10-04 NOTE — Telephone Encounter (Signed)
Daughter called to inquire if we can draw Hgb A1C for his PCP while here on 10/06/2019. She will fax the prescription with the order.

## 2019-10-06 ENCOUNTER — Inpatient Hospital Stay: Payer: Medicare Other

## 2019-10-06 ENCOUNTER — Inpatient Hospital Stay: Payer: Medicare Other | Admitting: Oncology

## 2019-10-06 ENCOUNTER — Telehealth: Payer: Self-pay | Admitting: Oncology

## 2019-10-06 NOTE — Telephone Encounter (Signed)
Sent scheduling message to reschedule lab/OV/treatment for next week.

## 2019-10-06 NOTE — Telephone Encounter (Signed)
Returned patient's phone call regarding cancelling 12/16 appointments due to weather conditions. Appointment cancelled.

## 2019-10-06 NOTE — Progress Notes (Deleted)
  [place] OFFICE PROGRESS NOTE   Diagnosis:   INTERVAL HISTORY:   ***  Objective:  Vital signs in last 24 hours:  There were no vitals taken for this visit.    HEENT: *** Lymphatics: *** Resp: *** Cardio: *** GI: *** Vascular: *** Neuro:***  Skin:***   Portacath/PICC-without erythema  Lab Results:  Lab Results  Component Value Date   WBC 8.1 09/14/2019   HGB 12.1 (L) 09/14/2019   HCT 38.6 (L) 09/14/2019   MCV 97.7 09/14/2019   PLT 185 09/14/2019   NEUTROABS 6.1 09/14/2019    CMP  Lab Results  Component Value Date   NA 136 09/14/2019   K 5.3 (H) 09/14/2019   CL 97 (L) 09/14/2019   CO2 28 09/14/2019   GLUCOSE 140 (H) 09/14/2019   BUN 34 (H) 09/14/2019   CREATININE 1.10 09/14/2019   CALCIUM 9.9 09/14/2019   PROT 6.7 09/14/2019   ALBUMIN 3.4 (L) 09/14/2019   AST 13 (L) 09/14/2019   ALT 13 09/14/2019   ALKPHOS 66 09/14/2019   BILITOT 0.5 09/14/2019   GFRNONAA >60 09/14/2019   GFRAA >60 09/14/2019    No results found for: CEA1  Lab Results  Component Value Date   INR 1.60 06/05/2018    Imaging:  No results found.  Medications: I have reviewed the patient's current medications.   Assessment/Plan: 1. 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  Pathology showed squamous cell carcinoma; positive for cytokeratin 5/6 and p63.    06/05/2018 status post excision of recurrent right thumb squamous cell carcinoma with distal interphalangeal joint disarticulation and volar advancement flap.  Pathology showed invasive keratinizing squamous cell carcinoma, moderately differentiated, spanning 4.6 cm.  Carcinoma was broadly present at the resection margin of the larger piece of tissue.  There was carcinoma involvement of bone.  Radiation to the right thumb 09/16/2016 through 10/01/2016, 2400  cGy  Recurrent disease involving the proximal right thumb, soft tissue, and metacarpal head, status post amputation involving the metacarpal bone and soft tissue mass, loop anastomosis of the radial digital nerve/ulnar nerve, rotational flap closure of the right thumb 03/20/2019  Pathology of the right thumb amputation 03/20/2019-invasive moderately differentiated squamous cell carcinoma invading skeletal muscle and bone, carcinoma present at the soft tissue resection margin  Right axillary mass August 2020  Right axillary ultrasound 06/23/2019-complex right axillary mass  CTs neck and chest 07/02/2019-right axillary lymph nodes, no other evidence of metastatic disease  Ultrasound-guided biopsy of right axillary mass on 07/14/2019-squamous cell carcinoma  Cycle 1 Cemiplimab 08/25/2019  Cycle 2 Cemiplimab 09/14/2019  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  Pathology showed squamous cell carcinoma 3.5 cm, intermediate grade, 0.2 cm from the nearest inked margin; perineural invasion present; lymphovascular invasion not observed; 4 lymph nodes uninvolved with tumor.  Cannot completely rule out a metastatic lesion. 3. History of multiple squamous cell skin cancers 4. Atrial fibrillation 5. Coumadin anticoagulation secondary to #4 6. Hypertension 7. Diabetes mellitus 8. Chronic renal failure 9. CHF 10. History of osteomyelitis of the spine      Disposition: ***  Betsy Coder, MD  10/06/2019  7:57 AM

## 2019-10-07 ENCOUNTER — Other Ambulatory Visit: Payer: Self-pay

## 2019-10-07 ENCOUNTER — Encounter: Payer: Self-pay | Admitting: Nurse Practitioner

## 2019-10-07 ENCOUNTER — Telehealth: Payer: Self-pay | Admitting: *Deleted

## 2019-10-07 ENCOUNTER — Inpatient Hospital Stay: Payer: Medicare Other | Attending: Oncology | Admitting: Nurse Practitioner

## 2019-10-07 VITALS — BP 98/49 | HR 67 | Temp 98.7°F | Resp 17 | Ht 69.0 in | Wt 263.2 lb

## 2019-10-07 DIAGNOSIS — Z7901 Long term (current) use of anticoagulants: Secondary | ICD-10-CM | POA: Diagnosis not present

## 2019-10-07 DIAGNOSIS — Z85828 Personal history of other malignant neoplasm of skin: Secondary | ICD-10-CM | POA: Diagnosis not present

## 2019-10-07 DIAGNOSIS — N189 Chronic kidney disease, unspecified: Secondary | ICD-10-CM | POA: Diagnosis not present

## 2019-10-07 DIAGNOSIS — I130 Hypertensive heart and chronic kidney disease with heart failure and stage 1 through stage 4 chronic kidney disease, or unspecified chronic kidney disease: Secondary | ICD-10-CM | POA: Diagnosis not present

## 2019-10-07 DIAGNOSIS — C44622 Squamous cell carcinoma of skin of right upper limb, including shoulder: Secondary | ICD-10-CM | POA: Insufficient documentation

## 2019-10-07 DIAGNOSIS — I4891 Unspecified atrial fibrillation: Secondary | ICD-10-CM | POA: Insufficient documentation

## 2019-10-07 DIAGNOSIS — E1122 Type 2 diabetes mellitus with diabetic chronic kidney disease: Secondary | ICD-10-CM | POA: Insufficient documentation

## 2019-10-07 DIAGNOSIS — Z5112 Encounter for antineoplastic immunotherapy: Secondary | ICD-10-CM | POA: Diagnosis present

## 2019-10-07 NOTE — Progress Notes (Addendum)
[place] OFFICE PROGRESS NOTE   Diagnosis: Squamous cell carcinoma  INTERVAL HISTORY:   Charles Chambers returns prior to scheduled follow-up to evaluate rupture of the right axillary tumor.  He completed cycle 2 Cemiplimab 09/14/2019.  He is accompanied by his daughter to today's visit.  She reports the right axillary tumor had increased to approximately softball size.  Yesterday afternoon the tumor began draining copious amounts of serosanguineous fluid.  The drainage has decreased.  His daughter has noticed the right hand tumor appears to be "drying".  He denies nausea/vomiting.  No mouth sores.  No diarrhea.  No rash.  No fever.  Objective:  Vital signs in last 24 hours:  Blood pressure (!) 98/49, pulse 67, temperature 98.7 F (37.1 C), temperature source Temporal, resp. rate 17, height 5\' 9"  (1.753 m), weight 263 lb 3.2 oz (119.4 kg), SpO2 92 %.    GI: Abdomen soft and nontender.  No hepatomegaly. Vascular: No leg edema. Skin: Scaling and hyperpigmentation lower legs bilaterally. Musculoskeletal: Ulcerated mass right axilla, necrotic appearing tissue, minimal drainage.  Second tumor higher in the axilla with central opening.  Surrounding skin is mildly erythematous.  2 areas of ulcerated tumor dorsum of the proximal right hand.  The larger area is necrotic with surrounding induration.  The smaller area is dry appearing.  Lab Results:  Lab Results  Component Value Date   WBC 8.1 09/14/2019   HGB 12.1 (L) 09/14/2019   HCT 38.6 (L) 09/14/2019   MCV 97.7 09/14/2019   PLT 185 09/14/2019   NEUTROABS 6.1 09/14/2019    Imaging:  No results found.  Medications: I have reviewed the patient's current medications.  Assessment/Plan: 1. 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 Pathology showed squamous cell carcinoma;  positive for cytokeratin 5/6 and p63.   06/05/2018 status post excision of recurrent right thumb squamous cell carcinoma with distal interphalangeal joint disarticulation and volar advancement flap. Pathology showed invasive keratinizing squamous cell carcinoma, moderately differentiated, spanning 4.6 cm. Carcinoma was broadly present at the resection margin of the larger piece of tissue. There was carcinoma involvement of bone.  Radiation to the right thumb 09/16/2016 through 10/01/2016, 2400 cGy  Recurrent disease involving the proximal right thumb, soft tissue, and metacarpal head, status post amputation involving the metacarpal bone and soft tissue mass, loop anastomosis of the radial digital nerve/ulnar nerve, rotational flap closure of the right thumb 03/20/2019  Pathology of the right thumb amputation 03/20/2019-invasive moderately differentiated squamous cell carcinoma invading skeletal muscle and bone, carcinoma present at the soft tissue resection margin  Right axillary mass August 2020  Right axillary ultrasound 06/23/2019-complex right axillary mass  CTs neck and chest 07/02/2019-right axillary lymph nodes, no other evidence of metastatic disease  Ultrasound-guided biopsy of right axillary mass on 07/14/2019-squamous cell carcinoma  Cycle 1 Cemiplimab 08/25/2019  Cycle 2 Cemiplimab 09/14/2019  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 Pathology showed squamous cell carcinoma 3.5 cm, intermediate grade, 0.2 cm from the nearest inked margin; perineural invasion present; lymphovascular invasion not observed; 4 lymph nodes uninvolved with tumor. Cannot completely rule out a metastatic lesion. 3. History of multiple squamous cell skin cancers 4. Atrial fibrillation 5. Coumadin anticoagulation secondary to #4 6. Hypertension 7. Diabetes  mellitus 8. Chronic renal failure  9. CHF 10. History of osteomyelitis of the spine   Disposition: Charles Chambers has completed 2 cycles of Cemiplimab.  He seems to be tolerating it well.  He was scheduled for cycle 3 yesterday but canceled due to the weather.  He is now scheduled to receive cycle 3 on 10/12/2019.  The right axillary tumor has ruptured.  We will ask Dr. Arnoldo Morale to evaluate to see if there are any surgical options for wound management.  He will return as scheduled on 10/12/2019.  He will contact the office in the interim with any problems.  Patient seen with Dr. Benay Spice.  Ned Card ANP/GNP-BC   10/07/2019  2:28 PM  This was a shared visit with Ned Card.  Charles Chambers was interviewed and examined.  The right axillary tumor has ruptured, but appears smaller.  The proximal lesion at the right hand appears smaller with the distal lesion appearing more necrotic.  It is difficult to tell whether he is responding to the immunotherapy.  His pain appears improved.  I will contact Dr. Arnoldo Morale for recommendation regarding management the right axilla.  Mr. Goben will return for an office visit with the plan to administer cycle 3 cemiplimab next week.  Julieanne Manson, MD

## 2019-10-07 NOTE — Telephone Encounter (Signed)
Patients daughter called to report the tumor in the right axilla has burst. This happened last night. Patient was saturated with clear, hazy and blood tinged fluid. The area is decreased in size some. Patient denies any pain. The area has an open wound now. Daughter concerned. Fluid measures over a cup. Per Dr. Benay Spice ok to add patient to Ned Card, NP  Patient has significant mobility concerns and Katharine Look will be allowed to attend this visit.

## 2019-10-12 ENCOUNTER — Inpatient Hospital Stay: Payer: Medicare Other

## 2019-10-12 ENCOUNTER — Encounter: Payer: Self-pay | Admitting: Nurse Practitioner

## 2019-10-12 ENCOUNTER — Telehealth: Payer: Self-pay | Admitting: Oncology

## 2019-10-12 ENCOUNTER — Inpatient Hospital Stay (HOSPITAL_BASED_OUTPATIENT_CLINIC_OR_DEPARTMENT_OTHER): Payer: Medicare Other | Admitting: Nurse Practitioner

## 2019-10-12 ENCOUNTER — Other Ambulatory Visit: Payer: Self-pay

## 2019-10-12 VITALS — BP 109/59 | HR 70 | Temp 98.5°F | Resp 18 | Ht 69.0 in | Wt 260.5 lb

## 2019-10-12 DIAGNOSIS — Z5112 Encounter for antineoplastic immunotherapy: Secondary | ICD-10-CM | POA: Diagnosis not present

## 2019-10-12 DIAGNOSIS — C44622 Squamous cell carcinoma of skin of right upper limb, including shoulder: Secondary | ICD-10-CM

## 2019-10-12 DIAGNOSIS — R5383 Other fatigue: Secondary | ICD-10-CM

## 2019-10-12 LAB — CMP (CANCER CENTER ONLY)
ALT: 11 U/L (ref 0–44)
AST: 12 U/L — ABNORMAL LOW (ref 15–41)
Albumin: 3.2 g/dL — ABNORMAL LOW (ref 3.5–5.0)
Alkaline Phosphatase: 70 U/L (ref 38–126)
Anion gap: 11 (ref 5–15)
BUN: 38 mg/dL — ABNORMAL HIGH (ref 8–23)
CO2: 25 mmol/L (ref 22–32)
Calcium: 10.6 mg/dL — ABNORMAL HIGH (ref 8.9–10.3)
Chloride: 100 mmol/L (ref 98–111)
Creatinine: 1.33 mg/dL — ABNORMAL HIGH (ref 0.61–1.24)
GFR, Est AFR Am: 58 mL/min — ABNORMAL LOW (ref >60)
GFR, Estimated: 50 mL/min — ABNORMAL LOW (ref >60)
Glucose, Bld: 149 mg/dL — ABNORMAL HIGH (ref 70–99)
Potassium: 4.5 mmol/L (ref 3.5–5.1)
Sodium: 136 mmol/L (ref 135–145)
Total Bilirubin: 0.5 mg/dL (ref 0.3–1.2)
Total Protein: 6.7 g/dL (ref 6.5–8.1)

## 2019-10-12 LAB — CBC WITH DIFFERENTIAL (CANCER CENTER ONLY)
Abs Immature Granulocytes: 0.04 10*3/uL (ref 0.00–0.07)
Basophils Absolute: 0 10*3/uL (ref 0.0–0.1)
Basophils Relative: 0 %
Eosinophils Absolute: 0.3 10*3/uL (ref 0.0–0.5)
Eosinophils Relative: 3 %
HCT: 37 % — ABNORMAL LOW (ref 39.0–52.0)
Hemoglobin: 11.9 g/dL — ABNORMAL LOW (ref 13.0–17.0)
Immature Granulocytes: 0 %
Lymphocytes Relative: 6 %
Lymphs Abs: 0.6 10*3/uL — ABNORMAL LOW (ref 0.7–4.0)
MCH: 30.5 pg (ref 26.0–34.0)
MCHC: 32.2 g/dL (ref 30.0–36.0)
MCV: 94.9 fL (ref 80.0–100.0)
Monocytes Absolute: 1.1 10*3/uL — ABNORMAL HIGH (ref 0.1–1.0)
Monocytes Relative: 11 %
Neutro Abs: 7.9 10*3/uL — ABNORMAL HIGH (ref 1.7–7.7)
Neutrophils Relative %: 80 %
Platelet Count: 191 10*3/uL (ref 150–400)
RBC: 3.9 MIL/uL — ABNORMAL LOW (ref 4.22–5.81)
RDW: 13.2 % (ref 11.5–15.5)
WBC Count: 10 10*3/uL (ref 4.0–10.5)
nRBC: 0 % (ref 0.0–0.2)

## 2019-10-12 MED ORDER — ZOLEDRONIC ACID 4 MG/100ML IV SOLN
4.0000 mg | Freq: Once | INTRAVENOUS | Status: AC
  Administered 2019-10-12: 4 mg via INTRAVENOUS
  Filled 2019-10-12: qty 100

## 2019-10-12 MED ORDER — SODIUM CHLORIDE 0.9 % IV SOLN
Freq: Once | INTRAVENOUS | Status: AC
  Filled 2019-10-12: qty 250

## 2019-10-12 MED ORDER — SODIUM CHLORIDE 0.9% FLUSH
10.0000 mL | INTRAVENOUS | Status: DC | PRN
  Filled 2019-10-12: qty 10

## 2019-10-12 MED ORDER — SODIUM CHLORIDE 0.9 % IV SOLN
350.0000 mg | Freq: Once | INTRAVENOUS | Status: AC
  Administered 2019-10-12: 350 mg via INTRAVENOUS
  Filled 2019-10-12: qty 7

## 2019-10-12 MED ORDER — HEPARIN SOD (PORK) LOCK FLUSH 100 UNIT/ML IV SOLN
500.0000 [IU] | Freq: Once | INTRAVENOUS | Status: DC | PRN
  Filled 2019-10-12: qty 5

## 2019-10-12 NOTE — Telephone Encounter (Signed)
Scheduled per los. Gave avs and calendar  

## 2019-10-12 NOTE — Progress Notes (Signed)
[place] OFFICE PROGRESS NOTE   Diagnosis: Squamous cell carcinoma  INTERVAL HISTORY:   Mr. Trivino returns as scheduled.  He completed cycle 2 Cemiplimab 09/14/2019.  He canceled/rescheduled cycle 3 due to the weather.  He was seen in an unscheduled visit on 10/07/2019 due to rupture of the right axillary tumor.  He is accompanied by his daughter.  She reports continued drainage from the axillary wounds.  She feels like the tumor on the right hand is better.  He denies pain.  No fever.  No diarrhea.  Previous groin rash has nearly resolved.  Appetite is stable.  His daughter has noticed recent mild confusion.  Objective:  Vital signs in last 24 hours:  Blood pressure (!) 109/59, pulse 70, temperature 98.5 F (36.9 C), temperature source Temporal, resp. rate 18, height 5\' 9"  (1.753 m), weight 260 lb 8 oz (118.2 kg), SpO2 93 %.    GI: Abdomen soft and nontender.  No hepatomegaly. Vascular: No leg edema.  Skin: Scaling and hyperpigmentation of the lower legs bilaterally. Musculoskeletal: Ulcerated open tumor x2 right axilla.  Surrounding skin is erythematous.  2 persistent areas of ulcerated tumor dorsum of the proximal right hand.  Small lesion seems to be slightly smaller, less indurated.   Lab Results:  Lab Results  Component Value Date   WBC 10.0 10/12/2019   HGB 11.9 (L) 10/12/2019   HCT 37.0 (L) 10/12/2019   MCV 94.9 10/12/2019   PLT 191 10/12/2019   NEUTROABS 7.9 (H) 10/12/2019    Imaging:  No results found.  Medications: I have reviewed the patient's current medications.  Assessment/Plan: 1. 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 Pathology showed squamous cell carcinoma; positive for cytokeratin 5/6 and p63.   06/05/2018 status post excision of recurrent right thumb squamous cell carcinoma with  distal interphalangeal joint disarticulation and volar advancement flap. Pathology showed invasive keratinizing squamous cell carcinoma, moderately differentiated, spanning 4.6 cm. Carcinoma was broadly present at the resection margin of the larger piece of tissue. There was carcinoma involvement of bone.  Radiation to the right thumb 09/16/2016 through 10/01/2016, 2400 cGy  Recurrent disease involving the proximal right thumb, soft tissue, and metacarpal head, status post amputation involving the metacarpal bone and soft tissue mass, loop anastomosis of the radial digital nerve/ulnar nerve, rotational flap closure of the right thumb 03/20/2019  Pathology of the right thumb amputation 03/20/2019-invasive moderately differentiated squamous cell carcinoma invading skeletal muscle and bone, carcinoma present at the soft tissue resection margin  Right axillary mass August 2020  Right axillary ultrasound 06/23/2019-complex right axillary mass  CTs neck and chest 07/02/2019-right axillary lymph nodes, no other evidence of metastatic disease  Ultrasound-guided biopsy of right axillary mass on 07/14/2019-squamous cell carcinoma  Cycle 1 Cemiplimab 08/25/2019  Cycle 2 Cemiplimab 09/14/2019  Cycle 3 Cemiplimab 10/12/2019  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 Pathology showed squamous cell carcinoma 3.5 cm, intermediate grade, 0.2 cm from the nearest inked margin; perineural invasion present; lymphovascular invasion not observed; 4 lymph nodes uninvolved with tumor. Cannot completely rule out a metastatic lesion. 3. History of multiple squamous cell skin cancers 4. Atrial fibrillation 5. Coumadin anticoagulation secondary to #4 6. Hypertension 7. Diabetes mellitus 8. Chronic renal failure 9. CHF 10. History of osteomyelitis  of the spine 11. Hypercalcemia-Zometa  10/12/2019   Disposition: Mr. Seright appears unchanged.  He has completed 2 cycles of Cemiplimab.  Plan to proceed with cycle 3 today as scheduled.  We reviewed the CBC and chemistry panel from today, adequate for treatment.  Calcium is mildly elevated.  We recommend Zometa.  We reviewed potential side effects including flulike symptoms and osteonecrosis of the jaw with Mr. Sho[place]ake and his daughter.  They agree to proceed.  He continues to have significant drainage from the right axillary wound/tumor.  We are awaiting a return call from Dr. Arnoldo Morale for recommendation regarding further management.  He will return for lab, follow-up, cycle 4 Cemiplimab 11/09/2018 (4-week interval per daughter request).  He or his daughter will contact the office in the interim with any problems.  Patient seen with Dr. Benay Spice.  25 minutes were spent face-to-face at today's visit with the majority of that time involved in counseling/coordination of care.    Ned Card ANP/GNP-BC   10/12/2019  2:03 PM  This was a shared visit with Ned Card.  Mr. Charlebois was interviewed and examined.  His daughter was present for today's visit.  I discussed the case with Dr. Arnoldo Morale.  Dr. Arnoldo Morale will evaluate the right axillary wound.  The necrotic tumor at the base of the right hand and right wrist appears slightly smaller.  The axillary mass and wounds appear stable.  It is unclear whether he is responding to the cemiplimab.  He will complete cycle 3 and return for reassessment in 4 weeks.  His daughter will contact us in the interim as needed.  Julieanne Manson, MD

## 2019-10-12 NOTE — Patient Instructions (Signed)
[facility] Discharge Instructions for Patients Receiving Chemotherapy  Today you received the following chemotherapy agents: Libtayo  To help prevent nausea and vomiting after your treatment, we encourage you to take your nausea medication as directed.   If you develop nausea and vomiting that is not controlled by your nausea medication, call the clinic.   BELOW ARE SYMPTOMS THAT SHOULD BE REPORTED IMMEDIATELY:  *FEVER GREATER THAN 100.5 F  *CHILLS WITH OR WITHOUT FEVER  NAUSEA AND VOMITING THAT IS NOT CONTROLLED WITH YOUR NAUSEA MEDICATION  *UNUSUAL SHORTNESS OF BREATH  *UNUSUAL BRUISING OR BLEEDING  TENDERNESS IN MOUTH AND THROAT WITH OR WITHOUT PRESENCE OF ULCERS  *URINARY PROBLEMS  *BOWEL PROBLEMS  UNUSUAL RASH Items with * indicate a potential emergency and should be followed up as soon as possible.  Feel free to call the clinic should you have any questions or concerns. The clinic phone number is [phone].  Please show the [facility] at check-in to the Emergency Department and triage nurse.   Zoledronic Acid injection (Hypercalcemia, Oncology) What is this medicine? ZOLEDRONIC ACID (ZOE le dron ik AS id) lowers the amount of calcium loss from bone. It is used to treat too much calcium in your blood from cancer. It is also used to prevent complications of cancer that has spread to the bone. This medicine may be used for other purposes; ask your health care provider or pharmacist if you have questions. COMMON BRAND NAME(S): Zometa What should I tell my health care provider before I take this medicine? They need to know if you have any of these conditions:  aspirin-sensitive asthma  cancer, especially if you are receiving medicines used to treat cancer  dental disease or wear dentures  infection  kidney disease  receiving corticosteroids like dexamethasone or prednisone  an unusual or allergic reaction to zoledronic acid, other  medicines, foods, dyes, or preservatives  pregnant or trying to get pregnant  breast-feeding How should I use this medicine? This medicine is for infusion into a vein. It is given by a health care professional in a hospital or clinic setting. Talk to your pediatrician regarding the use of this medicine in children. Special care may be needed. Overdosage: If you think you have taken too much of this medicine contact a poison control center or emergency room at once. NOTE: This medicine is only for you. Do not share this medicine with others. What if I miss a dose? It is important not to miss your dose. Call your doctor or health care professional if you are unable to keep an appointment. What may interact with this medicine?  certain antibiotics given by injection  NSAIDs, medicines for pain and inflammation, like ibuprofen or naproxen  some diuretics like bumetanide, furosemide  teriparatide  thalidomide This list may not describe all possible interactions. Give your health care provider a list of all the medicines, herbs, non-prescription drugs, or dietary supplements you use. Also tell them if you smoke, drink alcohol, or use illegal drugs. Some items may interact with your medicine. What should I watch for while using this medicine? Visit your doctor or health care professional for regular checkups. It may be some time before you see the benefit from this medicine. Do not stop taking your medicine unless your doctor tells you to. Your doctor may order blood tests or other tests to see how you are doing. Women should inform their doctor if they wish to become pregnant or think they might be  pregnant. There is a potential for serious side effects to an unborn child. Talk to your health care professional or pharmacist for more information. You should make sure that you get enough calcium and vitamin D while you are taking this medicine. Discuss the foods you eat and the vitamins you take  with your health care professional. Some people who take this medicine have severe bone, joint, and/or muscle pain. This medicine may also increase your risk for jaw problems or a broken thigh bone. Tell your doctor right away if you have severe pain in your jaw, bones, joints, or muscles. Tell your doctor if you have any pain that does not go away or that gets worse. Tell your dentist and dental surgeon that you are taking this medicine. You should not have major dental surgery while on this medicine. See your dentist to have a dental exam and fix any dental problems before starting this medicine. Take good care of your teeth while on this medicine. Make sure you see your dentist for regular follow-up appointments. What side effects may I notice from receiving this medicine? Side effects that you should report to your doctor or health care professional as soon as possible:  allergic reactions like skin rash, itching or hives, swelling of the face, lips, or tongue  anxiety, confusion, or depression  breathing problems  changes in vision  eye pain  feeling faint or lightheaded, falls  jaw pain, especially after dental work  mouth sores  muscle cramps, stiffness, or weakness  redness, blistering, peeling or loosening of the skin, including inside the mouth  trouble passing urine or change in the amount of urine Side effects that usually do not require medical attention (report to your doctor or health care professional if they continue or are bothersome):  bone, joint, or muscle pain  constipation  diarrhea  fever  hair loss  irritation at site where injected  loss of appetite  nausea, vomiting  stomach upset  trouble sleeping  trouble swallowing  weak or tired This list may not describe all possible side effects. Call your doctor for medical advice about side effects. You may report side effects to FDA at 1-800-FDA-1088. Where should I keep my medicine? This drug  is given in a hospital or clinic and will not be stored at home. NOTE: This sheet is a summary. It may not cover all possible information. If you have questions about this medicine, talk to your doctor, pharmacist, or health care provider.  2020 Elsevier/Gold Standard (2014-03-05 14:19:39)  

## 2019-10-19 ENCOUNTER — Ambulatory Visit (INDEPENDENT_AMBULATORY_CARE_PROVIDER_SITE_OTHER): Payer: Medicare Other | Admitting: General Surgery

## 2019-10-19 ENCOUNTER — Encounter: Payer: Self-pay | Admitting: General Surgery

## 2019-10-19 ENCOUNTER — Other Ambulatory Visit: Payer: Self-pay

## 2019-10-19 VITALS — BP 100/65 | HR 82 | Temp 97.5°F | Resp 18 | Ht 69.0 in | Wt 262.0 lb

## 2019-10-19 DIAGNOSIS — D0461 Carcinoma in situ of skin of right upper limb, including shoulder: Secondary | ICD-10-CM

## 2019-10-19 MED ORDER — SILVER SULFADIAZINE 1 % EX CREA: TOPICAL_CREAM | CUTANEOUS | 2 refills | Status: DC

## 2019-10-19 NOTE — Progress Notes (Signed)
Subjective:     Charles Chambers  Patient returns with now an open wound in the right axilla where the tumor was present.  He has been undergoing immunotherapy for his squamous cell carcinoma.  The wound recently opened up and intermittently bleeds.  He does take Coumadin.  His daughter brought him here for his appointment.  It was painful prior to rupturing through the skin. Objective:    BP 100/65 (BP Location: Left Arm, Patient Position: Sitting, Cuff Size: Large)   Pulse 82   Temp (!) 97.5 F (36.4 C) (Oral)   Resp 18   Ht 5\' 9"  (1.753 m)   Wt 262 lb (118.8 kg)   SpO2 96%   BMI 38.69 kg/m   General:  alert, cooperative, appears stated age and no distress  Right axilla with large open granulating wound present.  The subcutaneous tumor size has significantly decreased.  The open wound measures approximately 5 to 6 cm in its greatest diameter.  No active bleeding is noted, but the granulation tissue is friable.     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    Plan:   As this lesion is not amenable to surgical intervention, will start Silvadene cream twice a day to help mediate the wound.  Unfortunately, we are limited as to how much we can treat this.  The daughter is fully aware of this as well as the patient.  I will follow this expectantly with them as they have frequent doctor's appointments.  They know to call my office as needed.

## 2019-10-26 ENCOUNTER — Ambulatory Visit: Payer: Medicare Other | Admitting: General Surgery

## 2019-11-06 ENCOUNTER — Other Ambulatory Visit: Payer: Self-pay | Admitting: Oncology

## 2019-11-09 ENCOUNTER — Emergency Department (HOSPITAL_COMMUNITY): Payer: Medicare Other

## 2019-11-09 ENCOUNTER — Encounter (HOSPITAL_COMMUNITY): Payer: Self-pay

## 2019-11-09 ENCOUNTER — Inpatient Hospital Stay (HOSPITAL_COMMUNITY)
Admission: EM | Admit: 2019-11-09 | Discharge: 2019-11-16 | DRG: 641 | Disposition: A | Discharge status: Skilled Nursing Facility | Payer: Medicare Other | Attending: Family Medicine | Admitting: Family Medicine

## 2019-11-09 ENCOUNTER — Other Ambulatory Visit: Payer: Self-pay

## 2019-11-09 DIAGNOSIS — L8990 Pressure ulcer of unspecified site, unspecified stage: Secondary | ICD-10-CM | POA: Insufficient documentation

## 2019-11-09 DIAGNOSIS — IMO0002 Reserved for concepts with insufficient information to code with codable children: Secondary | ICD-10-CM

## 2019-11-09 DIAGNOSIS — I130 Hypertensive heart and chronic kidney disease with heart failure and stage 1 through stage 4 chronic kidney disease, or unspecified chronic kidney disease: Secondary | ICD-10-CM | POA: Diagnosis present

## 2019-11-09 DIAGNOSIS — I2510 Atherosclerotic heart disease of native coronary artery without angina pectoris: Secondary | ICD-10-CM | POA: Diagnosis present

## 2019-11-09 DIAGNOSIS — E861 Hypovolemia: Secondary | ICD-10-CM | POA: Diagnosis present

## 2019-11-09 DIAGNOSIS — Z85828 Personal history of other malignant neoplasm of skin: Secondary | ICD-10-CM

## 2019-11-09 DIAGNOSIS — Z955 Presence of coronary angioplasty implant and graft: Secondary | ICD-10-CM

## 2019-11-09 DIAGNOSIS — D72829 Elevated white blood cell count, unspecified: Secondary | ICD-10-CM | POA: Diagnosis present

## 2019-11-09 DIAGNOSIS — Z515 Encounter for palliative care: Secondary | ICD-10-CM

## 2019-11-09 DIAGNOSIS — I5032 Chronic diastolic (congestive) heart failure: Secondary | ICD-10-CM | POA: Diagnosis present

## 2019-11-09 DIAGNOSIS — W19XXXA Unspecified fall, initial encounter: Secondary | ICD-10-CM

## 2019-11-09 DIAGNOSIS — K5900 Constipation, unspecified: Secondary | ICD-10-CM | POA: Diagnosis present

## 2019-11-09 DIAGNOSIS — Z7901 Long term (current) use of anticoagulants: Secondary | ICD-10-CM

## 2019-11-09 DIAGNOSIS — Z87891 Personal history of nicotine dependence: Secondary | ICD-10-CM

## 2019-11-09 DIAGNOSIS — E871 Hypo-osmolality and hyponatremia: Secondary | ICD-10-CM

## 2019-11-09 DIAGNOSIS — D631 Anemia in chronic kidney disease: Secondary | ICD-10-CM | POA: Diagnosis present

## 2019-11-09 DIAGNOSIS — I10 Essential (primary) hypertension: Secondary | ICD-10-CM | POA: Diagnosis present

## 2019-11-09 DIAGNOSIS — I4821 Permanent atrial fibrillation: Secondary | ICD-10-CM | POA: Diagnosis present

## 2019-11-09 DIAGNOSIS — E119 Type 2 diabetes mellitus without complications: Secondary | ICD-10-CM

## 2019-11-09 DIAGNOSIS — T502X5A Adverse effect of carbonic-anhydrase inhibitors, benzothiadiazides and other diuretics, initial encounter: Secondary | ICD-10-CM | POA: Diagnosis present

## 2019-11-09 DIAGNOSIS — E785 Hyperlipidemia, unspecified: Secondary | ICD-10-CM | POA: Diagnosis present

## 2019-11-09 DIAGNOSIS — R531 Weakness: Secondary | ICD-10-CM

## 2019-11-09 DIAGNOSIS — Z9981 Dependence on supplemental oxygen: Secondary | ICD-10-CM

## 2019-11-09 DIAGNOSIS — E1151 Type 2 diabetes mellitus with diabetic peripheral angiopathy without gangrene: Secondary | ICD-10-CM | POA: Diagnosis present

## 2019-11-09 DIAGNOSIS — C799 Secondary malignant neoplasm of unspecified site: Secondary | ICD-10-CM

## 2019-11-09 DIAGNOSIS — Z79899 Other long term (current) drug therapy: Secondary | ICD-10-CM

## 2019-11-09 DIAGNOSIS — S41101A Unspecified open wound of right upper arm, initial encounter: Secondary | ICD-10-CM

## 2019-11-09 DIAGNOSIS — N189 Chronic kidney disease, unspecified: Secondary | ICD-10-CM | POA: Diagnosis present

## 2019-11-09 DIAGNOSIS — C44622 Squamous cell carcinoma of skin of right upper limb, including shoulder: Secondary | ICD-10-CM

## 2019-11-09 DIAGNOSIS — Z9221 Personal history of antineoplastic chemotherapy: Secondary | ICD-10-CM

## 2019-11-09 DIAGNOSIS — Z7982 Long term (current) use of aspirin: Secondary | ICD-10-CM

## 2019-11-09 DIAGNOSIS — Z7189 Other specified counseling: Secondary | ICD-10-CM

## 2019-11-09 DIAGNOSIS — E1122 Type 2 diabetes mellitus with diabetic chronic kidney disease: Secondary | ICD-10-CM | POA: Diagnosis present

## 2019-11-09 DIAGNOSIS — E1140 Type 2 diabetes mellitus with diabetic neuropathy, unspecified: Secondary | ICD-10-CM | POA: Diagnosis present

## 2019-11-09 DIAGNOSIS — Z20822 Contact with and (suspected) exposure to covid-19: Secondary | ICD-10-CM | POA: Diagnosis present

## 2019-11-09 DIAGNOSIS — C44529 Squamous cell carcinoma of skin of other part of trunk: Secondary | ICD-10-CM | POA: Diagnosis present

## 2019-11-09 DIAGNOSIS — I252 Old myocardial infarction: Secondary | ICD-10-CM

## 2019-11-09 DIAGNOSIS — Z794 Long term (current) use of insulin: Secondary | ICD-10-CM

## 2019-11-09 NOTE — ED Triage Notes (Signed)
Pt brought in [facility] from [facility]. Pt fell at home alone and was down over 4 hours before he was out of the floor. Family had to call fire department to get patient out of the floor and  in the car. Pt states that his leg gave out and fell. Denies any pain. Does have cancer to the right arm.

## 2019-11-10 ENCOUNTER — Emergency Department (HOSPITAL_COMMUNITY): Payer: Medicare Other

## 2019-11-10 ENCOUNTER — Ambulatory Visit: Payer: Medicare Other | Admitting: Oncology

## 2019-11-10 ENCOUNTER — Encounter (HOSPITAL_COMMUNITY): Payer: Self-pay | Admitting: Family Medicine

## 2019-11-10 ENCOUNTER — Telehealth: Payer: Self-pay | Admitting: *Deleted

## 2019-11-10 ENCOUNTER — Ambulatory Visit: Payer: Medicare Other

## 2019-11-10 ENCOUNTER — Other Ambulatory Visit: Payer: Medicare Other

## 2019-11-10 DIAGNOSIS — R531 Weakness: Secondary | ICD-10-CM

## 2019-11-10 DIAGNOSIS — I5032 Chronic diastolic (congestive) heart failure: Secondary | ICD-10-CM

## 2019-11-10 DIAGNOSIS — E871 Hypo-osmolality and hyponatremia: Secondary | ICD-10-CM | POA: Diagnosis not present

## 2019-11-10 DIAGNOSIS — I10 Essential (primary) hypertension: Secondary | ICD-10-CM

## 2019-11-10 DIAGNOSIS — Z794 Long term (current) use of insulin: Secondary | ICD-10-CM

## 2019-11-10 DIAGNOSIS — I2510 Atherosclerotic heart disease of native coronary artery without angina pectoris: Secondary | ICD-10-CM | POA: Diagnosis present

## 2019-11-10 DIAGNOSIS — E119 Type 2 diabetes mellitus without complications: Secondary | ICD-10-CM

## 2019-11-10 LAB — CBC WITH DIFFERENTIAL/PLATELET
Abs Immature Granulocytes: 0.1 10*3/uL — ABNORMAL HIGH (ref 0.00–0.07)
Basophils Absolute: 0 10*3/uL (ref 0.0–0.1)
Basophils Relative: 0 %
Eosinophils Absolute: 0 10*3/uL (ref 0.0–0.5)
Eosinophils Relative: 0 %
HCT: 33.7 % — ABNORMAL LOW (ref 39.0–52.0)
Hemoglobin: 10.6 g/dL — ABNORMAL LOW (ref 13.0–17.0)
Immature Granulocytes: 1 %
Lymphocytes Relative: 4 %
Lymphs Abs: 0.6 10*3/uL — ABNORMAL LOW (ref 0.7–4.0)
MCH: 30.4 pg (ref 26.0–34.0)
MCHC: 31.5 g/dL (ref 30.0–36.0)
MCV: 96.6 fL (ref 80.0–100.0)
Monocytes Absolute: 1.4 10*3/uL — ABNORMAL HIGH (ref 0.1–1.0)
Monocytes Relative: 9 %
Neutro Abs: 12.4 10*3/uL — ABNORMAL HIGH (ref 1.7–7.7)
Neutrophils Relative %: 86 %
Platelets: 320 10*3/uL (ref 150–400)
RBC: 3.49 MIL/uL — ABNORMAL LOW (ref 4.22–5.81)
RDW: 13.7 % (ref 11.5–15.5)
WBC: 14.5 10*3/uL — ABNORMAL HIGH (ref 4.0–10.5)
nRBC: 0 % (ref 0.0–0.2)

## 2019-11-10 LAB — HEMOGLOBIN A1C
Hgb A1c MFr Bld: 6.9 % — ABNORMAL HIGH (ref 4.8–5.6)
Mean Plasma Glucose: 151.33 mg/dL

## 2019-11-10 LAB — COMPREHENSIVE METABOLIC PANEL
ALT: 14 U/L (ref 0–44)
AST: 15 U/L (ref 15–41)
Albumin: 2.8 g/dL — ABNORMAL LOW (ref 3.5–5.0)
Alkaline Phosphatase: 75 U/L (ref 38–126)
Anion gap: 9 (ref 5–15)
BUN: 31 mg/dL — ABNORMAL HIGH (ref 8–23)
CO2: 26 mmol/L (ref 22–32)
Calcium: 9.3 mg/dL (ref 8.9–10.3)
Chloride: 92 mmol/L — ABNORMAL LOW (ref 98–111)
Creatinine, Ser: 1.23 mg/dL (ref 0.61–1.24)
GFR calc Af Amer: >60 mL/min (ref >60)
GFR calc non Af Amer: 55 mL/min — ABNORMAL LOW (ref >60)
Glucose, Bld: 118 mg/dL — ABNORMAL HIGH (ref 70–99)
Potassium: 4.3 mmol/L (ref 3.5–5.1)
Sodium: 127 mmol/L — ABNORMAL LOW (ref 135–145)
Total Bilirubin: 1.1 mg/dL (ref 0.3–1.2)
Total Protein: 6.8 g/dL (ref 6.5–8.1)

## 2019-11-10 LAB — BASIC METABOLIC PANEL
Anion gap: 9 (ref 5–15)
BUN: 28 mg/dL — ABNORMAL HIGH (ref 8–23)
CO2: 28 mmol/L (ref 22–32)
Calcium: 9.1 mg/dL (ref 8.9–10.3)
Chloride: 97 mmol/L — ABNORMAL LOW (ref 98–111)
Creatinine, Ser: 1.16 mg/dL (ref 0.61–1.24)
GFR calc Af Amer: >60 mL/min (ref >60)
GFR calc non Af Amer: 59 mL/min — ABNORMAL LOW (ref >60)
Glucose, Bld: 101 mg/dL — ABNORMAL HIGH (ref 70–99)
Potassium: 4.7 mmol/L (ref 3.5–5.1)
Sodium: 134 mmol/L — ABNORMAL LOW (ref 135–145)

## 2019-11-10 LAB — LACTIC ACID, PLASMA
Lactic Acid, Venous: 2.2 mmol/L (ref 0.5–1.9)
Lactic Acid, Venous: 1.6 mmol/L (ref 0.5–1.9)

## 2019-11-10 LAB — PROTIME-INR
INR: 3.3 — ABNORMAL HIGH (ref 0.8–1.2)
INR: 3.3 — ABNORMAL HIGH (ref 0.8–1.2)
Prothrombin Time: 33.6 seconds — ABNORMAL HIGH (ref 11.4–15.2)
Prothrombin Time: 33.7 seconds — ABNORMAL HIGH (ref 11.4–15.2)

## 2019-11-10 LAB — APTT: aPTT: 63 seconds — ABNORMAL HIGH (ref 24–36)

## 2019-11-10 LAB — URINALYSIS, ROUTINE W REFLEX MICROSCOPIC
Bilirubin Urine: NEGATIVE
Glucose, UA: NEGATIVE mg/dL
Hgb urine dipstick: NEGATIVE
Ketones, ur: NEGATIVE mg/dL
Leukocytes,Ua: NEGATIVE
Nitrite: NEGATIVE
Protein, ur: NEGATIVE mg/dL
Specific Gravity, Urine: 1.013 (ref 1.005–1.030)
pH: 5 (ref 5.0–8.0)

## 2019-11-10 LAB — BRAIN NATRIURETIC PEPTIDE: B Natriuretic Peptide: 177 pg/mL — ABNORMAL HIGH (ref 0.0–100.0)

## 2019-11-10 LAB — TROPONIN I (HIGH SENSITIVITY)
Troponin I (High Sensitivity): 8 ng/L (ref <18)
Troponin I (High Sensitivity): 7 ng/L (ref <18)

## 2019-11-10 LAB — SARS CORONAVIRUS 2 (TAT 6-24 HRS): SARS Coronavirus 2: NEGATIVE

## 2019-11-10 LAB — CORTISOL: Cortisol, Plasma: 14.1 ug/dL

## 2019-11-10 LAB — CK: Total CK: 111 U/L (ref 49–397)

## 2019-11-10 LAB — AMMONIA: Ammonia: 14 umol/L (ref 9–35)

## 2019-11-10 LAB — GLUCOSE, CAPILLARY
Glucose-Capillary: 280 mg/dL — ABNORMAL HIGH (ref 70–99)
Glucose-Capillary: 225 mg/dL — ABNORMAL HIGH (ref 70–99)

## 2019-11-10 LAB — CBG MONITORING, ED
Glucose-Capillary: 92 mg/dL (ref 70–99)
Glucose-Capillary: 126 mg/dL — ABNORMAL HIGH (ref 70–99)
Glucose-Capillary: 268 mg/dL — ABNORMAL HIGH (ref 70–99)

## 2019-11-10 MED ORDER — SODIUM CHLORIDE 0.9 % IV SOLN
2.0000 g | INTRAVENOUS | Status: DC
  Administered 2019-11-11 – 2019-11-12 (×2): 2 g via INTRAVENOUS
  Filled 2019-11-10 (×2): qty 20

## 2019-11-10 MED ORDER — WARFARIN - PHARMACIST DOSING INPATIENT: Freq: Every day | Status: DC

## 2019-11-10 MED ORDER — DEXAMETHASONE SODIUM PHOSPHATE 4 MG/ML IJ SOLN
4.0000 mg | Freq: Once | INTRAMUSCULAR | Status: AC
  Administered 2019-11-10: 4 mg via INTRAVENOUS
  Filled 2019-11-10: qty 1

## 2019-11-10 MED ORDER — ACETAMINOPHEN 650 MG RE SUPP: 650.0000 mg | Freq: Four times a day (QID) | RECTAL | Status: DC | PRN

## 2019-11-10 MED ORDER — INSULIN ASPART 100 UNIT/ML ~~LOC~~ SOLN
0.0000 [IU] | Freq: Every day | SUBCUTANEOUS | Status: DC
  Administered 2019-11-10 – 2019-11-15 (×4): 2 [IU] via SUBCUTANEOUS

## 2019-11-10 MED ORDER — SODIUM CHLORIDE 0.9% FLUSH
3.0000 mL | Freq: Two times a day (BID) | INTRAVENOUS | Status: DC
  Administered 2019-11-10 – 2019-11-16 (×12): 3 mL via INTRAVENOUS

## 2019-11-10 MED ORDER — SODIUM CHLORIDE 0.9 % IV SOLN: 250.0000 mL | INTRAVENOUS | Status: DC | PRN

## 2019-11-10 MED ORDER — SODIUM CHLORIDE 0.9 % IV BOLUS
1000.0000 mL | Freq: Once | INTRAVENOUS | Status: AC
  Administered 2019-11-10: 1000 mL via INTRAVENOUS

## 2019-11-10 MED ORDER — ONDANSETRON HCL 4 MG PO TABS: 4.0000 mg | ORAL_TABLET | Freq: Four times a day (QID) | ORAL | Status: DC | PRN

## 2019-11-10 MED ORDER — SODIUM CHLORIDE 0.9 % IV SOLN
1.0000 g | Freq: Once | INTRAVENOUS | Status: AC
  Administered 2019-11-10: 1 g via INTRAVENOUS
  Filled 2019-11-10: qty 10

## 2019-11-10 MED ORDER — ATORVASTATIN CALCIUM 20 MG PO TABS
20.0000 mg | ORAL_TABLET | Freq: Every day | ORAL | Status: DC
  Administered 2019-11-10 – 2019-11-15 (×6): 20 mg via ORAL
  Filled 2019-11-10 (×6): qty 1

## 2019-11-10 MED ORDER — VITAMIN D 25 MCG (1000 UNIT) PO TABS
3000.0000 [IU] | ORAL_TABLET | Freq: Every day | ORAL | Status: DC
  Administered 2019-11-10 – 2019-11-16 (×7): 3000 [IU] via ORAL
  Filled 2019-11-10 (×15): qty 3

## 2019-11-10 MED ORDER — SENNOSIDES-DOCUSATE SODIUM 8.6-50 MG PO TABS
1.0000 | ORAL_TABLET | Freq: Every evening | ORAL | Status: DC | PRN
  Filled 2019-11-10: qty 1

## 2019-11-10 MED ORDER — INSULIN ASPART 100 UNIT/ML ~~LOC~~ SOLN
5.0000 [IU] | Freq: Three times a day (TID) | SUBCUTANEOUS | Status: DC
  Administered 2019-11-10 – 2019-11-11 (×4): 5 [IU] via SUBCUTANEOUS
  Filled 2019-11-10: qty 1

## 2019-11-10 MED ORDER — CARVEDILOL 12.5 MG PO TABS
12.5000 mg | ORAL_TABLET | Freq: Two times a day (BID) | ORAL | Status: DC
  Administered 2019-11-10 – 2019-11-14 (×8): 12.5 mg via ORAL
  Filled 2019-11-10 (×8): qty 1

## 2019-11-10 MED ORDER — LACTATED RINGERS IV BOLUS: 1000.0000 mL | Freq: Once | INTRAVENOUS | Status: DC

## 2019-11-10 MED ORDER — TRAMADOL HCL 50 MG PO TABS: 50.0000 mg | ORAL_TABLET | Freq: Four times a day (QID) | ORAL | Status: DC | PRN

## 2019-11-10 MED ORDER — INSULIN ASPART 100 UNIT/ML ~~LOC~~ SOLN
0.0000 [IU] | Freq: Three times a day (TID) | SUBCUTANEOUS | Status: DC
  Administered 2019-11-10 (×2): 5 [IU] via SUBCUTANEOUS
  Administered 2019-11-10: 1 [IU] via SUBCUTANEOUS
  Administered 2019-11-11: 2 [IU] via SUBCUTANEOUS
  Administered 2019-11-11 (×2): 3 [IU] via SUBCUTANEOUS
  Administered 2019-11-12 – 2019-11-13 (×4): 2 [IU] via SUBCUTANEOUS
  Administered 2019-11-13: 3 [IU] via SUBCUTANEOUS
  Administered 2019-11-13: 1 [IU] via SUBCUTANEOUS
  Administered 2019-11-14: 2 [IU] via SUBCUTANEOUS
  Administered 2019-11-14: 3 [IU] via SUBCUTANEOUS
  Administered 2019-11-14: 1 [IU] via SUBCUTANEOUS
  Administered 2019-11-15: 3 [IU] via SUBCUTANEOUS
  Administered 2019-11-15: 2 [IU] via SUBCUTANEOUS
  Administered 2019-11-15: 1 [IU] via SUBCUTANEOUS
  Administered 2019-11-16: 3 [IU] via SUBCUTANEOUS
  Administered 2019-11-16: 2 [IU] via SUBCUTANEOUS
  Filled 2019-11-10 (×2): qty 1

## 2019-11-10 MED ORDER — NYSTATIN 100000 UNIT/GM EX POWD
Freq: Two times a day (BID) | CUTANEOUS | Status: DC
  Filled 2019-11-10 (×2): qty 15

## 2019-11-10 MED ORDER — SILVER SULFADIAZINE 1 % EX CREA
TOPICAL_CREAM | Freq: Two times a day (BID) | CUTANEOUS | Status: DC
  Filled 2019-11-10: qty 85
  Filled 2019-11-10: qty 50

## 2019-11-10 MED ORDER — NITROGLYCERIN 0.4 MG SL SUBL: 0.4000 mg | SUBLINGUAL_TABLET | SUBLINGUAL | Status: DC | PRN

## 2019-11-10 MED ORDER — CARVEDILOL 12.5 MG PO TABS: 25.0000 mg | ORAL_TABLET | Freq: Two times a day (BID) | ORAL | Status: DC

## 2019-11-10 MED ORDER — SODIUM CHLORIDE 0.9% FLUSH: 3.0000 mL | INTRAVENOUS | Status: DC | PRN

## 2019-11-10 MED ORDER — ACETAMINOPHEN 325 MG PO TABS
650.0000 mg | ORAL_TABLET | Freq: Four times a day (QID) | ORAL | Status: DC | PRN
  Administered 2019-11-12 – 2019-11-16 (×5): 650 mg via ORAL
  Filled 2019-11-10 (×6): qty 2

## 2019-11-10 MED ORDER — ONDANSETRON HCL 4 MG/2ML IJ SOLN: 4.0000 mg | Freq: Four times a day (QID) | INTRAMUSCULAR | Status: DC | PRN

## 2019-11-10 MED ORDER — SODIUM CHLORIDE 0.9% FLUSH: 3.0000 mL | Freq: Two times a day (BID) | INTRAVENOUS | Status: DC

## 2019-11-10 MED ORDER — HYDROCODONE-ACETAMINOPHEN 5-325 MG PO TABS: 1.0000 | ORAL_TABLET | Freq: Four times a day (QID) | ORAL | Status: DC | PRN

## 2019-11-10 MED ORDER — INSULIN GLARGINE 100 UNIT/ML ~~LOC~~ SOLN
18.0000 [IU] | Freq: Every day | SUBCUTANEOUS | Status: DC
  Administered 2019-11-11 – 2019-11-16 (×6): 18 [IU] via SUBCUTANEOUS
  Filled 2019-11-10 (×8): qty 0.18

## 2019-11-10 NOTE — ED Notes (Signed)
Spoke to [employer] (daughter/POA) regarding updates.

## 2019-11-10 NOTE — ED Provider Notes (Signed)
Emergency Department Provider Note   I have reviewed the triage vital signs and the nursing notes.   HISTORY  Chief Complaint Fall   HPI Charles Chambers is a 84 y.o. male who presents the emergency department today secondary to weakness.  History is obtained from patient and daughter and records.  Appears the patient has had an issue with squamous cell carcinoma status post excision of his right thumb and right axillary area that has become necrotic and draining.  Daughter states that she saw him this morning and checked his INR and it was 6.8.  She was going back to check on him again this afternoon and he had been on the floor for approximately 4 hours and could not get because of weakness.  Patient states that when this happened he just got very weak in the legs and went down to the floor and could not get back up.  Did not have any focal weakness otherwise.  Did not hit his head.  Not syncopized.  No chest pain palpitations.  Daughter to call EMS to help get him up.  When he got up he was short of breath he very speak in full sentences and the daughter states that it seemed like he might pass out so she called EMS and brought him here for further evaluation.  At this time patient denies any complaints besides generalized weakness.  He denies any recent nausea, vomiting, cough, fever, diarrhea, constipation or vomiting.   No other associated or modifying symptoms.    Past Medical History:  Diagnosis Date  . Abnormal gait   . Atrial fibrillation ([place])   . CAD (coronary artery disease)   . 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 Chronic back pain   . Complication of anesthesia    supraclavicular block caused trouble breathing and heartrate dropped  . Congestive heart failure with left ventricular diastolic dysfunction, NYHA class 2 ([place])   . Diabetes mellitus    INSULIN DEPENDENT, CONTROLLED Type 2  . Diabetic neuropathy ([place])   . Diabetic neuropathy ([place])    . Diverticulosis of colon   . Hx of colonoscopy    10 YEARS AGO  . Hx of nonmelanoma skin cancer   . Hx of osteomyelitis   . Hyperlipidemia   . Hypertension    BENIGN ESSENTIAL  . Lower extremity venous stasis   . MI (myocardial infarction) ([place]) 09/06/1995  . Mild renal insufficiency    decreased kidney function  . Moderate mitral regurgitation   . Morbid obesity with BMI of 40.0-44.9, adult ([place])   . Obstructive sleep apnea on CPAP    does not wear CPAP, wears 2L O2 [place]  . Osteoarthritis, multiple sites   . Osteomyelitis of vertebra of lumbar region ([place])   . Pneumonia   . Status post coronary artery stent placement 09/09/1995  . Vitamin D deficiency     Patient Active Problem List   Diagnosis Date Noted  . Spell of generalized weakness 11/10/2019  . CAD (coronary artery disease) 11/10/2019  . Insulin-requiring or dependent type II diabetes mellitus ([place]) 11/10/2019  . Chronic diastolic CHF (congestive heart failure) ([place]) 11/10/2019  . Hyponatremia 11/10/2019  . Essential hypertension 11/10/2019  . 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 Goals of care, counseling/discussion 08/18/2019  . Cancer of first metacarpal bone of left hand ([place]) 03/20/2019  . Permanent atrial fibrillation ([place]) 02/20/2018    Past Surgical History:  Procedure Laterality Date  . AMPUTATION Right 06/05/2018   Procedure: RIGHT THUMB REVISION AMPUTATION;  Surgeon: Charlotte Crumb, MD;  Location: [facility];  Service: Orthopedics;  Laterality: Right;  . AMPUTATION Right 03/20/2019   Procedure: Right thumb amputation with neurolysis and rotation flap coverage as necessary;  Surgeon: Roseanne Kaufman, MD;  Location: [facility];  Service: Orthopedics;  Laterality: Right;  60 mins  . ANGIOPLASTY  08/1995   STENT PLACEMENT  . ARM/ELBOW SURGERY  2016  . BACK SURGERY    . CORONARY STENT PLACEMENT  09/09/1995  . I & D EXTREMITY Right 03/23/2018   Procedure: IRRIGATION AND DEBRIDEMENT WITH  INTERPHANGEAL JOINT [place] ROTATION FLAP;  Surgeon: Charlotte Crumb, MD;  Location: [facility];  Service: Orthopedics;  Laterality: Right;  . LAMINECTOMY     OPEN BIOPSY OF L4-S1  . PAROTIDECTOMY Right 06/2016  . POSTERIOR FUSION FOR OSTEOMYLITIS  11/2008  . REMOVAL OF CYST FROM EYELID  10/2010    Current Outpatient Rx  . Order #: [id number] Class: Historical Med  . Order #: [id number] Class: Historical Med  . Order #: [id number] Class: Historical Med  . Order #: [id number] Class: Historical Med  . Order #: [id number] Class: Historical Med  . Order #: [id number] Class: Historical Med  . Order #: [id number] Class: Historical Med  . Order #: [id number] Class: Historical Med  . Order #: [id number] Class: Historical Med  . Order #: [id number] Class: Historical Med  . Order #: [id number] Class: Historical Med  . Order #: [id number] Class: Normal  . Order #: [id number] Class: Normal  . Order #: [id number] Class: Historical Med    Allergies Naproxen sodium, Contrast media [iodinated diagnostic agents], Nsaids, Other, Baclofen, Codeine, Mushroom extract complex, and Neurontin [gabapentin]  Family History  Problem Relation Age of Onset  . Heart disease Mother 43  . Heart disease Father     Social History Social History   Tobacco Use  . Smoking status: Former Smoker    Types: Cigarettes  . Smokeless tobacco: Never Used  . Tobacco comment:  " stopped smoking cigarettes in 1970 "  Substance Use Topics  . Alcohol use: No  . Drug use: No    Review of Systems  All other systems negative except as documented in the HPI. All pertinent positives and negatives as reviewed in the HPI. ____________________________________________   PHYSICAL EXAM:  VITAL SIGNS: ED Triage Vitals  Enc Vitals Group     BP 11/09/19 2052 110/65     Pulse Rate 11/09/19 2052 80     Resp 11/09/19 2052 20     Temp 11/09/19 2052 98.5 F (36.9 C)     Temp Source 11/09/19 2052 Oral     SpO2 11/09/19 2052 95 %     Weight 11/09/19 2051  261 lb 14.5 oz (118.8 kg)     Height 11/09/19 2052 5\' 9"  (1.753 m)    Constitutional: Alert and oriented. Unkept appearaing, but in no acute distress. Eyes: Conjunctivae are normal. PERRL. EOMI. Head: Atraumatic. Nose: No congestion/rhinnorhea. Mouth/Throat: Mucous membranes are dry.  Oropharynx non-erythematous. Neck: No stridor.  No meningeal signs.   Cardiovascular: Normal rate, irregular rhythm. Good peripheral circulation. Grossly normal heart sounds.   Respiratory: Normal respiratory effort.  No retractions. Lungs CTAB. Gastrointestinal: Soft and nontender. No distention.  Musculoskeletal: No lower extremity tenderness nor edema. No gross deformities of extremities. Neurologic:  Normal speech and language. No gross focal neurologic deficits are appreciated.  Skin: large open wound to right axillary area with foul smelling drainage, mild erythema   ____________________________________________  LABS (all labs ordered are listed, but only abnormal results are displayed)  Labs Reviewed  LACTIC ACID, PLASMA - Abnormal; Notable for the following components:      Result Value   Lactic Acid, Venous 2.2 (*)    All other components within normal limits  COMPREHENSIVE METABOLIC PANEL - Abnormal; Notable for the following components:   Sodium 127 (*)    Chloride 92 (*)    Glucose, Bld 118 (*)    BUN 31 (*)    Albumin 2.8 (*)    GFR calc non Af Amer 55 (*)    All other components within normal limits  CBC WITH DIFFERENTIAL/PLATELET - Abnormal; Notable for the following components:   WBC 14.5 (*)    RBC 3.49 (*)    Hemoglobin 10.6 (*)    HCT 33.7 (*)    Neutro Abs 12.4 (*)    Lymphs Abs 0.6 (*)    Monocytes Absolute 1.4 (*)    Abs Immature Granulocytes 0.10 (*)    All other components within normal limits  APTT - Abnormal; Notable for the following components:   aPTT 63 (*)    All other components within normal limits  PROTIME-INR - Abnormal; Notable for the following  components:   Prothrombin Time 33.6 (*)    INR 3.3 (*)    All other components within normal limits  BRAIN NATRIURETIC PEPTIDE - Abnormal; Notable for the following components:   B Natriuretic Peptide 177.0 (*)    All other components within normal limits  CULTURE, BLOOD (ROUTINE X 2)  CULTURE, BLOOD (ROUTINE X 2)  URINE CULTURE  SARS CORONAVIRUS 2 (TAT 6-24 HRS)  LACTIC ACID, PLASMA  URINALYSIS, ROUTINE W REFLEX MICROSCOPIC  CK  AMMONIA  BASIC METABOLIC PANEL  HEMOGLOBIN A1C  PROTIME-INR  CORTISOL  CBG MONITORING, ED  TROPONIN I (HIGH SENSITIVITY)  TROPONIN I (HIGH SENSITIVITY)   ____________________________________________  EKG   EKG Interpretation  Date/Time:    Ventricular Rate:    PR Interval:    QRS Duration:   QT Interval:    QTC Calculation:   R Axis:     Text Interpretation:         ____________________________________________  RADIOLOGY  CT Head Wo Contrast  Result Date: 11/10/2019 CLINICAL DATA:  Head trauma and headache. Fall. EXAM: CT HEAD WITHOUT CONTRAST TECHNIQUE: Contiguous axial images were obtained from the base of the skull through the vertex without intravenous contrast. COMPARISON:  None. FINDINGS: Brain: There is no mass, hemorrhage or extra-axial collection. There is generalized atrophy without lobar predilection. Hypodensity of the white matter is most commonly associated with chronic microvascular disease. Vascular: Atherosclerotic calcification of the vertebral and internal carotid arteries at the skull base. No abnormal hyperdensity of the major intracranial arteries or dural venous sinuses. Skull: The visualized skull base, calvarium and extracranial soft tissues are normal. Sinuses/Orbits: No fluid levels or advanced mucosal thickening of the visualized paranasal sinuses. No mastoid or middle ear effusion. The orbits are normal. IMPRESSION: Chronic small vessel disease and generalized volume loss without acute intracranial abnormality.  Electronically Signed   By: Ulyses Jarred M.D.   On: 11/10/2019 00:43   DG Chest Port 1 View  Result Date: 11/10/2019 CLINICAL DATA:  Weakness. Fall yesterday. EXAM: PORTABLE CHEST 1 VIEW COMPARISON:  None. FINDINGS: Cardiomegaly. Aortic atherosclerosis. Streaky left lower lobe atelectasis. No confluent airspace disease, pulmonary edema, large pleural effusion or pneumothorax. No acute osseous abnormalities. Advanced chronic degenerative changes of both shoulders. IMPRESSION: Cardiomegaly with  left lower lobe atelectasis. Aortic Atherosclerosis (ICD10-I70.0). Electronically Signed   By: Keith Rake M.D.   On: 11/10/2019 00:36    ____________________________________________   PROCEDURES  Procedure(s) performed:   Procedures   ____________________________________________   INITIAL IMPRESSION / ASSESSMENT AND PLAN / ED COURSE  Patient appears generally uncapped and likely deconditioned.  However that he has chronic kidney disease which could be worse has a supratherapeutic INR which could be worse or could also be causing some type of spinal compression was because of weakness in his legs.  Does not appear acutely ill.  We will plan for ER evaluation.  If something can be fixed simply will likely discharge.  If not may need to consider admission to hospital, may need MRI for weakness of legs in setting of supratherapeutic INR, could consider epidural hematoma.   INR is not too bad.  Low suspicion for thoracic causes of his symptoms.  He does possibly have a mild infection of his axillary area and is slightly hyponatremic.  Start antibiotics, fluids given.  Discussed with hospitalist will observe in hospital for improvement.  Pertinent labs & imaging results that were available during my care of the patient were reviewed by me and considered in my medical decision making (see chart for details). ____________________________________________  FINAL CLINICAL IMPRESSION(S) / ED  DIAGNOSES  Final diagnoses:  Hyponatremia  Weakness  Fall, initial encounter  Squamous cell cancer of skin of upper arm, right     MEDICATIONS GIVEN DURING THIS VISIT:  Medications  cefTRIAXone (ROCEPHIN) 2 g in sodium chloride 0.9 % 100 mL IVPB (has no administration in time range)  sodium chloride flush (NS) 0.9 % injection 3 mL (3 mLs Intravenous Not Given 11/10/19 0310)  sodium chloride flush (NS) 0.9 % injection 3 mL (3 mLs Intravenous Not Given 11/10/19 0310)  sodium chloride flush (NS) 0.9 % injection 3 mL (has no administration in time range)  0.9 %  sodium chloride infusion (has no administration in time range)  acetaminophen (TYLENOL) tablet 650 mg (has no administration in time range)    Or  acetaminophen (TYLENOL) suppository 650 mg (has no administration in time range)  HYDROcodone-acetaminophen (NORCO/VICODIN) 5-325 MG per tablet 1 tablet (has no administration in time range)  senna-docusate (Senokot-S) tablet 1 tablet (has no administration in time range)  ondansetron (ZOFRAN) tablet 4 mg (has no administration in time range)    Or  ondansetron (ZOFRAN) injection 4 mg (has no administration in time range)  insulin aspart (novoLOG) injection 0-9 Units (has no administration in time range)  insulin aspart (novoLOG) injection 0-5 Units (has no administration in time range)  dexamethasone (DECADRON) injection 4 mg (has no administration in time range)  Warfarin - Pharmacist Dosing Inpatient (has no administration in time range)  cefTRIAXone (ROCEPHIN) 1 g in sodium chloride 0.9 % 100 mL IVPB (0 g Intravenous Stopped 11/10/19 0345)  sodium chloride 0.9 % bolus 1,000 mL (0 mLs Intravenous Stopped 11/10/19 0345)  sodium chloride 0.9 % bolus 1,000 mL (1,000 mLs Intravenous New Bag/Given 11/10/19 0410)     NEW OUTPATIENT MEDICATIONS STARTED DURING THIS VISIT:  New Prescriptions   No medications on file    Note:  This note was prepared with assistance of Dragon voice  recognition software. Occasional wrong-word or sound-a-like substitutions may have occurred due to the inherent limitations of voice recognition software.   Chanya Chrisley, Corene Cornea, MD 11/10/19 [phone]

## 2019-11-10 NOTE — ED Notes (Signed)
James from PT eval is to see pt.  Pt had difficulty with getting OOB on his own.  PT suggest inpatient rehab.  Pt requiring O2@ 2l/m

## 2019-11-10 NOTE — ED Notes (Signed)
Pt has been in chair since 0910 by PT.  Pt only able to stand and pivot from chair to bed with 2 person assistance.

## 2019-11-10 NOTE — Progress Notes (Signed)
ANTICOAGULATION CONSULT NOTE -  Pharmacy Consult for warfarin dosing Indication: atrial fibrillation  Allergies  Allergen Reactions  . Naproxen Sodium Other (See Comments) and Nausea And Vomiting    Due to kidney function  . Contrast Media [Iodinated Diagnostic Agents]     : No contrast media dye due to fluctuations with renal function."  . Nsaids     Poor kidney function   . Other Other (See Comments)    Narcotics cause depression, especially codeine Contrast products can not be used due to kidney function   . Baclofen Other (See Comments)    HALLUCINATIONS    . Codeine     depression  . Mushroom Extract Complex Nausea And Vomiting  . Neurontin [Gabapentin] Other (See Comments)    overstimulation    Patient Measurements: Height: 5\' 9"  (175.3 cm) Weight: 262 lb (118.8 kg) IBW/kg (Calculated) : 70.7 Heparin Dosing Weight: HEPARIN DW (KG): 97.5  Vital Signs: Temp: 98.5 F (36.9 C) (01/19 2052) Temp Source: Oral (01/19 2052) BP: 121/85 (01/20 0630) Pulse Rate: 96 (01/20 0630)  Labs: Recent Labs    11/09/19 2335 11/10/19 0128 11/10/19 0438  HGB 10.6*  --   --   HCT 33.7*  --   --   PLT 320  --   --   APTT 63*  --   --   LABPROT 33.6*  --  33.7*  INR 3.3*  --  3.3*  CREATININE 1.23  --  1.16  CKTOTAL 111  --   --   TROPONINIHS 8 7  --     Estimated Creatinine Clearance: 63.5 mL/min (by C-G formula based on SCr of 1.16 mg/dL).   Medical History: Past Medical History:  Diagnosis Date  . Abnormal gait   . Atrial fibrillation ([place])   . CAD (coronary artery disease)   . 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 Chronic back pain   . Complication of anesthesia    supraclavicular block caused trouble breathing and heartrate dropped  . Congestive heart failure with left ventricular diastolic dysfunction, NYHA class 2 ([place])   . Diabetes mellitus    INSULIN DEPENDENT, CONTROLLED Type 2  . Diabetic neuropathy ([place])   . Diabetic  neuropathy ([place])   . Diverticulosis of colon   . Hx of colonoscopy    10 YEARS AGO  . Hx of nonmelanoma skin cancer   . Hx of osteomyelitis   . Hyperlipidemia   . Hypertension    BENIGN ESSENTIAL  . Lower extremity venous stasis   . MI (myocardial infarction) ([place]) 09/06/1995  . Mild renal insufficiency    decreased kidney function  . Moderate mitral regurgitation   . Morbid obesity with BMI of 40.0-44.9, adult ([place])   . Obstructive sleep apnea on CPAP    does not wear CPAP, wears 2L O2 [place]  . Osteoarthritis, multiple sites   . Osteomyelitis of vertebra of lumbar region ([place])   . Pneumonia   . Status post coronary artery stent placement 09/09/1995  . Vitamin D deficiency      Assessment: Pharmacy consulted to dose warfarin for this 84 yo male cancer patient with atrial fibrillation and has been on chronic anti-coagulation with warfarin for this condition.   Baseline H/H: 10.6/33.7 Platelets: 320K    INR: 3.3 Home dose: warfarin 2.5mg  on Mon-Wed-Fri-Sat and warfarin  5mg  on Tues/Thurs/Sun Last dose: warfarin 2.5mg  on 11/08/19 Drug Interactions: n/a Concurrent anti-coagulants: n/a  Goal of Therapy:  INR 2-3  Monitor platelets by anticoagulation protocol: Yes   Plan:  Hold warfarin x 1 dose Daily INR and CBC Monitor for signs and symptoms of bleeding.  Margot Ables, PharmD Clinical Pharmacist 11/10/2019 7:42 AM

## 2019-11-10 NOTE — NC FL2 (Signed)
[place] LEVEL OF CARE SCREENING TOOL     IDENTIFICATION  Patient Name: Charles Chambers Birthdate: [birth year] Sex: male Admission Date (Current Location): 11/09/2019  [facility] and [place] Number:  [employer] and Address:  [place] [address], [place]      Provider Number: [id number]  Attending Physician Name and Address:  Murlean Iba, MD  Relative Name and Phone Number:       Current Level of Care: Hospital Recommended Level of Care: [place] Prior Approval Number:    Date Approved/Denied:   PASRR Number:    Discharge Plan: SNF    Current Diagnoses: Patient Active Problem List   Diagnosis Date Noted  . Spell of generalized weakness 11/10/2019  . CAD (coronary artery disease) 11/10/2019  . Insulin-requiring or dependent type II diabetes mellitus ([place]) 11/10/2019  . Chronic diastolic CHF (congestive heart failure) ([place]) 11/10/2019  . Hyponatremia 11/10/2019  . Essential hypertension 11/10/2019  . 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 Goals of care, counseling/discussion 08/18/2019  . 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 Permanent atrial fibrillation ([place]) 02/20/2018    Orientation RESPIRATION BLADDER Height & Weight     Self, Place, Situation  O2(see dc summary) Incontinent Weight: 262 lb (118.8 kg) Height:  5\' 9"  (175.3 cm)  BEHAVIORAL SYMPTOMS/MOOD NEUROLOGICAL BOWEL NUTRITION STATUS      Continent Diet(see dc summary)  AMBULATORY STATUS COMMUNICATION OF NEEDS Skin   Extensive Assist Verbally Other (Comment)(Open wound in R Axilla from tumor that ruptured)                       Personal Care Assistance Level of Assistance  Bathing, Feeding, Dressing Bathing Assistance: Maximum assistance Feeding assistance: Independent Dressing Assistance: Maximum assistance     Functional Limitations Info  Sight, Hearing, Speech  Sight Info: Adequate Hearing Info: Adequate Speech Info: Adequate    SPECIAL CARE FACTORS FREQUENCY  PT (By licensed PT), OT (By licensed OT)     PT Frequency: 5 times per week OT Frequency: 3 times per week            Contractures Contractures Info: Not present    Additional Factors Info  Code Status, Allergies Code Status Info: Full Allergies Info: Naproxen Sodium, Contrast Media, Nsaids, Baclofen, Codeine, Mushroom Extract Complex, Neurontin           Current Medications (11/10/2019):  This is the current hospital active medication list Current Facility-Administered Medications  Medication Dose Route Frequency Provider Last Rate Last Admin  . acetaminophen (TYLENOL) tablet 650 mg  650 mg Oral Q6H PRN Opyd, Ilene Qua, MD       Or  . acetaminophen (TYLENOL) suppository 650 mg  650 mg Rectal Q6H PRN Opyd, Ilene Qua, MD      . atorvastatin (LIPITOR) tablet 20 mg  20 mg Oral QHS Johnson, Clanford L, MD      . carvedilol (COREG) tablet 12.5 mg  12.5 mg Oral BID WC Johnson, Clanford L, MD      . Derrill Memo ON 11/11/2019] cefTRIAXone (ROCEPHIN) 2 g in sodium chloride 0.9 % 100 mL IVPB  2 g Intravenous Q24H Opyd, Ilene Qua, MD      . cholecalciferol (VITAMIN D) tablet 3,000 Units  3,000 Units Oral Daily Johnson, Clanford L, MD      . HYDROcodone-acetaminophen (NORCO/VICODIN) 5-325 MG per tablet 1 tablet  1 tablet Oral Q6H PRN Opyd, Ilene Qua, MD      . insulin aspart (novoLOG) injection 0-5 Units  0-5 Units Subcutaneous QHS Opyd, Timothy S, MD      . insulin aspart (novoLOG) injection 0-9 Units  0-9 Units Subcutaneous TID WC Opyd, Ilene Qua, MD   5 Units at 11/10/19 1238  . insulin aspart (novoLOG) injection 5 Units  5 Units Subcutaneous TID WC Johnson, Clanford L, MD   5 Units at 11/10/19 1238  . nitroGLYCERIN (NITROSTAT) SL tablet 0.4 mg  0.4 mg Sublingual Q5 min PRN Johnson, Clanford L, MD      . ondansetron (ZOFRAN) tablet 4 mg  4 mg Oral Q6H PRN Opyd, Ilene Qua, MD       Or  .  ondansetron (ZOFRAN) injection 4 mg  4 mg Intravenous Q6H PRN Opyd, Ilene Qua, MD      . senna-docusate (Senokot-S) tablet 1 tablet  1 tablet Oral QHS PRN Opyd, Ilene Qua, MD      . sodium chloride flush (NS) 0.9 % injection 3 mL  3 mL Intravenous Q12H Opyd, Ilene Qua, MD   3 mL at 11/10/19 1055  . traMADol (ULTRAM) tablet 50 mg  50 mg Oral Q6H PRN Murlean Iba, MD      . Warfarin - Pharmacist Dosing Inpatient   Does not apply q1800 Opyd, Ilene Qua, MD       Current Outpatient Medications  Medication Sig Dispense Refill  . acetaminophen (TYLENOL) 500 MG tablet Take 1,000 mg by mouth 2 (two) times daily as needed for moderate pain.     Marland Kitchen aspirin EC 81 MG tablet Take 81 mg by mouth daily.      Marland Kitchen atorvastatin (LIPITOR) 20 MG tablet Take 20 mg by mouth at bedtime.     . carvedilol (COREG) 25 MG tablet Take 25 mg by mouth 2 (two) times daily with a meal.     . Cholecalciferol (VITAMIN D3) 3000 UNITS TABS Take 3,000 Units by mouth daily.     . furosemide (LASIX) 20 MG tablet Take 20 mg by mouth every Monday, Wednesday, and Friday.    . hydrochlorothiazide (HYDRODIURIL) 25 MG tablet Take 25 mg by mouth daily.      . insulin lispro protamine-insulin lispro (HUMALOG 75/25) (75-25) 100 UNIT/ML SUSP Inject 17-34 Units into the skin See admin instructions. Inject 34 units SQ in the morning and inject 17 units SQ in the evening    . lisinopril (PRINIVIL,ZESTRIL) 20 MG tablet Take 20 mg by mouth daily.    . nitroGLYCERIN (NITROSTAT) 0.4 MG SL tablet Place 0.4 mg under the tongue every 5 (five) minutes as needed for chest pain.   0  . OXYGEN Inhale 2 L into the lungs See admin instructions. Use 2 L at bedtime and as needed for shortness of breath    . traMADol (ULTRAM) 50 MG tablet Take 1 tablet (50 mg total) by mouth every 6 (six) hours as needed for moderate pain. 40 tablet 0  . warfarin (COUMADIN) 5 MG tablet Take 2.5-5 mg by mouth See admin instructions. Take 5 mg in the evening Tue, Thur, and Sun  after evening meals. Take 2.5 mg in the evening on Mon, Wed, Fri, and Sat    . silver sulfADIAZINE (SILVADENE) 1 % cream Apply to affected area twice a  day (Patient not taking: Reported on 11/10/2019) 50 g 2     Discharge Medications: Please see discharge summary for a list of  discharge medications.  Relevant Imaging Results:  Relevant Lab Results:   Additional Information    Shade Flood, LCSW

## 2019-11-10 NOTE — Evaluation (Signed)
Physical Therapy Evaluation Patient Details Name: Charles Chambers MRN: [id number] DOB: 06/24/1936 Today's Date: 11/10/2019   History of Present Illness  Charles Chambers is a 84 y.o. 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  [address] and hit his head in the bathroom, was too weak in general to get up from the floor, and laid there for approximately 4 hours before his daughter came home and found him.  She was unable to help him up and EMS was activated.  Patient is not sure how he fell, reports that he did hit his head but did not lose consciousness.  Denies any associated chest pain or lightheadedness, and denies any change in his chronic pain after the fall.  He denies any recent fevers, chills, or cough.  He has chronic dyspnea and uses supplemental oxygen at baseline.  Denies any recent abdominal pain, vomiting, or diarrhea.  He had his third cycle of immunotherapy 1 month ago for squamous cell carcinoma and has an open wound in the right axilla where a tumor ruptured.  His daughter provides wound care for him and keeps a close eye on his medical conditions.    Clinical Impression  Patient states he is on 2 LPM at home, but SpO2 remained at 96-100% throughout visit while on room air - RN aware.  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  Patient tolerated sitting up in chair after therapy with RN in room.  Patient will benefit from continued physical therapy in hospital and recommended venue below to increase  strength, balance, endurance for safe ADLs and gait.    Follow Up Recommendations SNF;Supervision/Assistance - 24 hour;Supervision for mobility/OOB    Equipment Recommendations  None recommended by PT    Recommendations for Other Services       Precautions / Restrictions Precautions Precautions: Fall Restrictions Weight Bearing Restrictions: No      Mobility  Bed Mobility Overal bed mobility: Needs Assistance Bed Mobility: Supine to Sit     Supine to sit: Min assist;Mod assist     General bed mobility comments: slow labored movement  Transfers Overall transfer level: Needs assistance Equipment used: Rolling walker (2 wheeled) Transfers: Sit to/from [facility] Sit to Stand: Min assist Stand pivot transfers: Min assist;Mod assist       General transfer comment: slow labored movement  Ambulation/Gait Ambulation/Gait assistance: Mod assist Gait Distance (Feet): 3 Feet Assistive device: Rolling walker (2 wheeled) Gait Pattern/deviations: Decreased step length - right;Decreased step length - left;Decreased stride length Gait velocity: slow   General Gait Details: limited to 3-4 slow unsteady steps at bedside secondary to c/o fatigue  Stairs            Wheelchair Mobility    Modified Rankin (Stroke Patients Only)       Balance Overall balance assessment: Needs assistance Sitting-balance support: No upper extremity supported;Feet supported Sitting balance-Leahy Scale: Fair Sitting balance - Comments: fair/good seated at EOB   Standing balance support: Bilateral upper extremity supported;During functional activity Standing balance-Leahy Scale: Fair Standing balance comment: using RW  Pertinent Vitals/Pain Pain Assessment: No/denies pain    Home Living Family/patient expects to be discharged to:: Private residence Living Arrangements: Alone Available Help at Discharge: Family;Available  PRN/intermittently           Home Equipment: Walker - 2 wheels;Bedside commode;Wheelchair - manual;Hospital bed      Prior Function Level of Independence: Needs assistance   Gait / Transfers Assistance Needed: short distanced household ambulator using RW, uses w/c for longer distances  ADL's / Homemaking Assistance Needed: assisted by his daughter for community ADLs        Hand Dominance        Extremity/Trunk Assessment   Upper Extremity Assessment Upper Extremity Assessment: Generalized weakness;RUE deficits/detail RUE Deficits / Details: grossly 3+/5, 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 RUE Sensation: WNL RUE Coordination: WNL    Lower Extremity Assessment Lower Extremity Assessment: Generalized weakness    Cervical / Trunk Assessment Cervical / Trunk Assessment: Normal  Communication   Communication: No difficulties  Cognition Arousal/Alertness: Awake/alert Behavior During Therapy: WFL for tasks assessed/performed Overall Cognitive Status: Within Functional Limits for tasks assessed                                        General Comments      Exercises     Assessment/Plan    PT Assessment Patient needs continued PT services  PT Problem List Decreased strength;Decreased activity tolerance;Decreased balance;Decreased mobility       PT Treatment Interventions DME instruction;Gait training;Stair training;Functional mobility training;Therapeutic activities;Therapeutic exercise;Patient/family education    PT Goals (Current goals can be found in the Care Plan section)  Acute Rehab PT Goals Patient Stated Goal: return home with family to assist PT Goal Formulation: With patient Time For Goal Achievement: 12-02-19 Potential to Achieve Goals: Good    Frequency Min 3X/week   Barriers to discharge        Co-evaluation               AM-PAC PT "6 Clicks" Mobility  Outcome Measure Help needed turning from your back to your  side while in a flat bed without using bedrails?: A Little Help needed moving from lying on your back to sitting on the side of a flat bed without using bedrails?: A Lot Help needed moving to and from a bed to a chair (including a wheelchair)?: A Lot Help needed standing up from a chair using your arms (e.g., wheelchair or bedside chair)?: A Little Help needed to walk in hospital room?: A Lot Help needed climbing 3-5 steps with a railing? : A Lot 6 Click Score: 14    End of Session   Activity Tolerance: Patient tolerated treatment well;Patient limited by fatigue Patient left: in chair;with call bell/phone within reach;with nursing/sitter in room Nurse Communication: Mobility status PT Visit Diagnosis: Unsteadiness on feet (R26.81);Other abnormalities of gait and mobility (R26.89);Muscle weakness (generalized) (M62.81)    Time: [id number] PT Time Calculation (min) (ACUTE ONLY): 31 min   Charges:   PT Evaluation $PT Eval Moderate Complexity: 1 Mod PT Treatments $Therapeutic Activity: 23-37 mins        10:03 AM, 11/10/19 Lonell Grandchild, MPT Physical Therapist with [facility] [phone] office [phone] mobile phone

## 2019-11-10 NOTE — Telephone Encounter (Signed)
[place] appointments today. Had fall yesterday and is short of breath. Is in the ER at [facility] awaiting admission. MD notified.

## 2019-11-10 NOTE — Progress Notes (Signed)
ANTICOAGULATION CONSULT NOTE - Initial Consult  Pharmacy Consult for warfarin dosing Indication: atrial fibrillation  Allergies  Allergen Reactions  . Naproxen Sodium Other (See Comments) and Nausea And Vomiting    Due to kidney function  . Contrast Media [Iodinated Diagnostic Agents]     : No contrast media dye due to fluctuations with renal function."  . Nsaids     Poor kidney function   . Other Other (See Comments)    Narcotics cause depression, especially codeine Contrast products can not be used due to kidney function   . Baclofen Other (See Comments)    HALLUCINATIONS    . Codeine     depression  . Mushroom Extract Complex Nausea And Vomiting  . Neurontin [Gabapentin] Other (See Comments)    overstimulation    Patient Measurements: Height: 5\' 9"  (175.3 cm) Weight: 262 lb (118.8 kg) IBW/kg (Calculated) : 70.7 Heparin Dosing Weight: HEPARIN DW (KG): 97.5  Vital Signs: Temp: 98.5 F (36.9 C) (01/19 2052) Temp Source: Oral (01/19 2052) BP: 99/45 (01/20 0245) Pulse Rate: 56 (01/20 0245)  Labs: Recent Labs    11/09/19 2335 11/10/19 0128  HGB 10.6*  --   HCT 33.7*  --   PLT 320  --   APTT 63*  --   LABPROT 33.6*  --   INR 3.3*  --   CREATININE 1.23  --   CKTOTAL 111  --   TROPONINIHS 8 7    Estimated Creatinine Clearance: 59.9 mL/min (by C-G formula based on SCr of 1.23 mg/dL).   Medical History: Past Medical History:  Diagnosis Date  . Abnormal gait   . Atrial fibrillation ([place])   . CAD (coronary artery disease)   . 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 Chronic back pain   . Complication of anesthesia    supraclavicular block caused trouble breathing and heartrate dropped  . Congestive heart failure with left ventricular diastolic dysfunction, NYHA class 2 ([place])   . Diabetes mellitus    INSULIN DEPENDENT, CONTROLLED Type 2  . Diabetic neuropathy ([place])   . Diabetic neuropathy ([place])   . Diverticulosis of colon   . Hx  of colonoscopy    10 YEARS AGO  . Hx of nonmelanoma skin cancer   . Hx of osteomyelitis   . Hyperlipidemia   . Hypertension    BENIGN ESSENTIAL  . Lower extremity venous stasis   . MI (myocardial infarction) ([place]) 09/06/1995  . Mild renal insufficiency    decreased kidney function  . Moderate mitral regurgitation   . Morbid obesity with BMI of 40.0-44.9, adult ([place])   . Obstructive sleep apnea on CPAP    does not wear CPAP, wears 2L O2 [place]  . Osteoarthritis, multiple sites   . Osteomyelitis of vertebra of lumbar region ([place])   . Pneumonia   . Status post coronary artery stent placement 09/09/1995  . Vitamin D deficiency      Assessment: Pharmacy consulted to dose warfarin for this 84 yo male cancer patient with atrial fibrillation and has been on chronic anti-coagulation with warfarin for this condition.    INR upon admission is slightly above goal, considering he hasn't taken his warfarin dose in two days.  This could be due to CHF exacerbation.  Baseline H/H: 10.6/33.7 Platelets: 320K HsTropI: 8>7  INR: 3.3 Home dose: warfarin 2.5mg  on Mon-Wed-Fri-Sat and warfarin  5mg  on Tues/Thurs/Sun Last dose: warfarin 2.5mg  on 11/08/19 Drug Interactions: n/a Concurrent anti-coagulants: n/a  Goal of Therapy:  INR 2-3 Monitor platelets by anticoagulation protocol: Yes   Plan:  Hold warfarin until results of INR this morning Daily INR and CBC Monitor for signs and symptoms of bleeding.  Despina Pole 11/10/2019,4:08 AM

## 2019-11-10 NOTE — H&P (Signed)
History and Physical    Charles Chambers [id number] DOB: November 24, 1935 DOA: 11/09/2019  PCP: Earney Mallet, MD   Patient coming from: Home   Chief Complaint: [place] and too weak to get up   HPI: Charles Chambers is a 84 y.o. 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  [place] and hit his head in the bathroom, was too weak in general to get up from the floor, and laid there for approximately 4 hours before his daughter came home and found him.  She was unable to help him up and EMS was activated.  Patient is not sure how he fell, reports that he did hit his head but did not lose consciousness.  Denies any associated chest pain or lightheadedness, and denies any change in his chronic pain after the fall.  He denies any recent fevers, chills, or cough.  He has chronic dyspnea and uses supplemental oxygen at baseline.  Denies any recent abdominal pain, vomiting, or diarrhea.  He had his third cycle of immunotherapy 1 month ago for squamous cell carcinoma and has an open wound in the right axilla where a tumor ruptured.  His daughter provides wound care for him and keeps a close eye on his medical conditions.  ED Course: Upon arrival to the ED, patient is found to be afebrile, saturating mid 90s on room air, and with normal respiratory and heart rate, and normal blood pressure.  EKG features atrial fibrillation with IVCD and T wave inversions that appear similar to prior.  Noncontrast head CT is negative for acute findings.  Chest x-ray with cardiomegaly and left basilar atelectasis.  Chemistry panel notable for sodium of 127.  Ammonia and serum CK are normal.  CBC with leukocytosis to 14,500 and  hemoglobin 10.6, down from 11.9 a month ago.  Lactic acid was 2.2 initially, INR 3.3, BNP 177, and troponin negative x2.  Blood and urine cultures were collected in the ED, 1 L of normal saline was administered, patient was treated with IV Rocephin, Covid PCR screening test has not yet resulted, and hospitalists are consulted for admission.  Review of Systems:  All other systems reviewed and apart from HPI, are negative.  Past Medical History:  Diagnosis Date  . Abnormal gait   . Atrial fibrillation ([place])   . CAD (coronary artery disease)   . 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 Chronic back pain   . Complication of anesthesia    supraclavicular block caused trouble breathing and heartrate dropped  . Congestive heart failure with left ventricular diastolic dysfunction, NYHA class 2 ([place])   . Diabetes mellitus    INSULIN DEPENDENT, CONTROLLED Type 2  . Diabetic neuropathy ([place])   . Diabetic neuropathy ([place])   . Diverticulosis of colon   . Hx of colonoscopy    10 YEARS AGO  . Hx of nonmelanoma skin cancer   . Hx of osteomyelitis   . Hyperlipidemia   . Hypertension    BENIGN ESSENTIAL  . Lower extremity venous stasis   . MI (myocardial infarction) ([place]) 09/06/1995  . Mild renal insufficiency    decreased kidney function  . Moderate mitral regurgitation   . Morbid obesity with BMI of 40.0-44.9, adult (  HCC)   . Obstructive sleep apnea on CPAP    does not wear CPAP, wears 2L O2 [place]  . Osteoarthritis, multiple sites   . Osteomyelitis of vertebra of lumbar region ([place])   . Pneumonia   . Status post coronary artery stent placement 09/09/1995  . Vitamin D deficiency     Past Surgical History:  Procedure Laterality Date  . AMPUTATION Right 06/05/2018   Procedure: RIGHT THUMB REVISION AMPUTATION;  Surgeon: Charlotte Crumb, MD;  Location: [facility];  Service: Orthopedics;  Laterality: Right;  . AMPUTATION Right 03/20/2019   Procedure: Right thumb amputation  with neurolysis and rotation flap coverage as necessary;  Surgeon: Roseanne Kaufman, MD;  Location: [facility];  Service: Orthopedics;  Laterality: Right;  60 mins  . ANGIOPLASTY  08/1995   STENT PLACEMENT  . ARM/ELBOW SURGERY  2016  . BACK SURGERY    . CORONARY STENT PLACEMENT  09/09/1995  . I & D EXTREMITY Right 03/23/2018   Procedure: IRRIGATION AND DEBRIDEMENT WITH INTERPHANGEAL JOINT [place] ROTATION FLAP;  Surgeon: Charlotte Crumb, MD;  Location: [facility];  Service: Orthopedics;  Laterality: Right;  . LAMINECTOMY     OPEN BIOPSY OF L4-S1  . PAROTIDECTOMY Right 06/2016  . POSTERIOR FUSION FOR OSTEOMYLITIS  11/2008  . REMOVAL OF CYST FROM EYELID  10/2010     reports that he has quit smoking. His smoking use included cigarettes. He has never used smokeless tobacco. He reports that he does not drink alcohol or use drugs.  Allergies  Allergen Reactions  . Naproxen Sodium Other (See Comments) and Nausea And Vomiting    Due to kidney function  . Contrast Media [Iodinated Diagnostic Agents]     : No contrast media dye due to fluctuations with renal function."  . Nsaids     Poor kidney function   . Other Other (See Comments)    Narcotics cause depression, especially codeine Contrast products can not be used due to kidney function   . Baclofen Other (See Comments)    HALLUCINATIONS    . Codeine     depression  . Mushroom Extract Complex Nausea And Vomiting  . Neurontin [Gabapentin] Other (See Comments)    overstimulation    Family History  Problem Relation Age of Onset  . Heart disease Mother 72  . Heart disease Father      Prior to Admission medications   Medication Sig Start Date End Date Taking? Authorizing Provider  acetaminophen (TYLENOL) 500 MG tablet Take 1,000 mg by mouth 2 (two) times daily as needed for moderate pain.     [provider]  aspirin EC 81 MG tablet Take 81 mg by mouth daily.      [provider]  atorvastatin (LIPITOR) 20 MG tablet  Take 20 mg by mouth at bedtime.     [provider]  carvedilol (COREG) 25 MG tablet Take 25 mg by mouth 2 (two) times daily with a meal.     [provider]  Cholecalciferol (VITAMIN D3) 3000 UNITS TABS Take 3,000 Units by mouth daily.     [provider]  furosemide (LASIX) 20 MG tablet Take 20 mg by mouth every Monday, Wednesday, and Friday.    [provider]  hydrochlorothiazide (HYDRODIURIL) 25 MG tablet Take 25 mg by mouth daily.      [provider]  insulin lispro protamine-insulin lispro (HUMALOG 75/25) (75-25) 100 UNIT/ML SUSP Inject 17-34 Units into the skin See admin instructions. Inject 34 units SQ in  the morning and inject 17 units SQ in the evening    [provider]  lisinopril (PRINIVIL,ZESTRIL) 20 MG tablet Take 20 mg by mouth daily.    [provider]  nitroGLYCERIN (NITROSTAT) 0.4 MG SL tablet Place 0.4 mg under the tongue every 5 (five) minutes as needed for chest pain.  05/21/18   [provider]  OXYGEN Inhale 2 L into the lungs See admin instructions. Use 2 L at bedtime and as needed for shortness of breath    [provider]  silver sulfADIAZINE (SILVADENE) 1 % cream Apply to affected area twice a  day 10/19/19 10/18/20  Aviva Signs, MD  traMADol (ULTRAM) 50 MG tablet Take 1 tablet (50 mg total) by mouth every 6 (six) hours as needed for moderate pain. 03/21/19 03/20/20  Roseanne Kaufman, MD  warfarin (COUMADIN) 5 MG tablet Take 2.5-5 mg by mouth See admin instructions. Take 5 mg in the evening Tue, Thur, and Sun after evening meals. Take 2.5 mg in the evening on Mon, Wed, Fri, and Sat    [provider]    Physical Exam: Vitals:   11/09/19 2051 11/09/19 2052 11/10/19 0245  BP:  110/65 (!) 99/45  Pulse:  80 (!) 56  Resp:  20 18  Temp:  98.5 F (36.9 C)   TempSrc:  Oral   SpO2:  95% 91%  Weight: 118.8 kg 118.8 kg   Height:  5\' 9"  (1.753 m)      Constitutional: NAD, calm  Eyes:  PERTLA, lids and conjunctivae normal ENMT: Mucous membranes are moist. Posterior pharynx clear of any exudate or lesions.   Neck: normal, supple, no masses, no thyromegaly Respiratory: no wheezing, no crackles. No accessory muscle use.  Cardiovascular: S1 & S2 heard, regular rate and rhythm. Mild pitting edema bilateral LE's. Abdomen: No distension, no tenderness, soft. Bowel sounds active.  Musculoskeletal: no clubbing / cyanosis. Status-post right thumb excision.   Skin: Open wound at right axilla with scant serous drainage and surrounding erythema. Erythema and some maceration involving bilateral axillae. Hyperpigmentation with crusted areas distal LE's bilaterally. Warm, dry, well-perfused. Neurologic: CN 2-12 grossly intact. Sensation intact. Strength 4-5/5 in all 4 limbs.  Psychiatric: Alert and answering questions appropriately. Pleasant and cooperative.    Labs and Imaging on Admission: I have personally reviewed following labs and imaging studies  CBC: Recent Labs  Lab 11/09/19 2335  WBC 14.5*  NEUTROABS 12.4*  HGB 10.6*  HCT 33.7*  MCV 96.6  PLT 99991111   Basic Metabolic Panel: Recent Labs  Lab 11/09/19 2335  NA 127*  K 4.3  CL 92*  CO2 26  GLUCOSE 118*  BUN 31*  CREATININE 1.23  CALCIUM 9.3   GFR: Estimated Creatinine Clearance: 59.9 mL/min (by C-G formula based on SCr of 1.23 mg/dL). Liver Function Tests: Recent Labs  Lab 11/09/19 2335  AST 15  ALT 14  ALKPHOS 75  BILITOT 1.1  PROT 6.8  ALBUMIN 2.8*   No results for input(s): LIPASE, AMYLASE in the last 168 hours. Recent Labs  Lab 11/09/19 2337  AMMONIA 14   Coagulation Profile: Recent Labs  Lab 11/09/19 2335  INR 3.3*   Cardiac Enzymes: Recent Labs  Lab 11/09/19 2335  CKTOTAL 111   BNP (last 3 results) No results for input(s): PROBNP in the last 8760 hours. HbA1C: No results for input(s): HGBA1C in the last 72 hours. CBG: No results for input(s): GLUCAP in the last 168 hours. Lipid  Profile: No results for  input(s): CHOL, HDL, LDLCALC, TRIG, CHOLHDL, LDLDIRECT in the last 72 hours. Thyroid Function Tests: No results for input(s): TSH, T4TOTAL, FREET4, T3FREE, THYROIDAB in the last 72 hours. Anemia Panel: No results for input(s): VITAMINB12, FOLATE, FERRITIN, TIBC, IRON, RETICCTPCT in the last 72 hours. Urine analysis:    Component Value Date/Time   COLORURINE YELLOW 11/10/2019 0106   APPEARANCEUR CLEAR 11/10/2019 0106   LABSPEC 1.013 11/10/2019 0106   PHURINE 5.0 11/10/2019 0106   GLUCOSEU NEGATIVE 11/10/2019 0106   HGBUR NEGATIVE 11/10/2019 0106   BILIRUBINUR NEGATIVE 11/10/2019 0106   KETONESUR NEGATIVE 11/10/2019 0106   PROTEINUR NEGATIVE 11/10/2019 0106   NITRITE NEGATIVE 11/10/2019 0106   LEUKOCYTESUR NEGATIVE 11/10/2019 0106   Sepsis Labs: @LABRCNTIP (procalcitonin:4,lacticidven:4) )No results found for this or any previous visit (from the past 240 hour(s)).   Radiological Exams on Admission: CT Head Wo Contrast  Result Date: 11/10/2019 CLINICAL DATA:  Head trauma and headache. Fall. EXAM: CT HEAD WITHOUT CONTRAST TECHNIQUE: Contiguous axial images were obtained from the base of the skull through the vertex without intravenous contrast. COMPARISON:  None. FINDINGS: Brain: There is no mass, hemorrhage or extra-axial collection. There is generalized atrophy without lobar predilection. Hypodensity of the white matter is most commonly associated with chronic microvascular disease. Vascular: Atherosclerotic calcification of the vertebral and internal carotid arteries at the skull base. No abnormal hyperdensity of the major intracranial arteries or dural venous sinuses. Skull: The visualized skull base, calvarium and extracranial soft tissues are normal. Sinuses/Orbits: No fluid levels or advanced mucosal thickening of the visualized paranasal sinuses. No mastoid or middle ear effusion. The orbits are normal. IMPRESSION: Chronic small vessel disease and generalized  volume loss without acute intracranial abnormality. Electronically Signed   By: Ulyses Jarred M.D.   On: 11/10/2019 00:43   DG Chest Port 1 View  Result Date: 11/10/2019 CLINICAL DATA:  Weakness. Fall yesterday. EXAM: PORTABLE CHEST 1 VIEW COMPARISON:  None. FINDINGS: Cardiomegaly. Aortic atherosclerosis. Streaky left lower lobe atelectasis. No confluent airspace disease, pulmonary edema, large pleural effusion or pneumothorax. No acute osseous abnormalities. Advanced chronic degenerative changes of both shoulders. IMPRESSION: Cardiomegaly with left lower lobe atelectasis. Aortic Atherosclerosis (ICD10-I70.0). Electronically Signed   By: Keith Rake M.D.   On: 11/10/2019 00:36    EKG: Independently reviewed. Atrial fibrillation, IVCD, T-wave inversions, similar to prior.   Assessment/Plan   1. Generalized weakness  - Presents after a fall at home, unable to get up from floor due to generalized weakness  - Head CT negative for acute findings, serum CK normal  - He appears hypovolemic in ED and was given a liter of NS - He is not encephalopathic and no focal neurologic deficits noted   - Patient has not noticed any acute change in his condition and this weakness may be due to deconditioning  - Continue supportive care, consult with PT for eval and tx    2. Hyponatremia   - Serum sodium is 127 in ED, down from 136 one month ago  - He appears hypovolemic and was given 1 liter NS in ED  - Hold HCTZ, hold Lasix, repeat chem panel in am   3. Atrial fibrillation  - In rate-controlled a fib in ED  - CHADS-VASc is 6 (age x2, CAD, CHF, DM, HTN)  - INR is 3.3 in ED, continue warfarin with pharmacy assistance    4. Chronic diastolic CHF  - Appears hypovolemic in ED and was given a 1 liter NS bolus  - Hold diuretics,  SLIV for now, check weight, follow I/O's    5. Cellulitis - Leukocytosis noted in ED and there is concern for non-purulent cellulitis surrounding open wound in right axilla  -  He was cultured in ED and started on Rocephin  - Continue wound care, Rocephin    6. Squamous cell carcinoma  - Completed 3rd cycle of immunotherapy 1 month ago, continues to follow with oncology, has seen general surgeon for open wound at right axilla  - Continue wound care     DVT prophylaxis: warfarin  Code Status: Full  Family Communication: Daughter updated from ED  Consults called: None  Admission status: Observation     Vianne Bulls, MD [facility] Pager: See [email or web address]  If 7AM-7PM, please contact the daytime attending [email or web address]  11/10/2019, 3:02 AM

## 2019-11-10 NOTE — Plan of Care (Signed)
  Problem: Acute Rehab PT Goals(only PT should resolve) Goal: Pt Will Go Supine/Side To Sit Outcome: Progressing Flowsheets (Taken 11/10/2019 1005) Pt will go Supine/Side to Sit:  with min guard assist  with minimal assist Goal: Patient Will Transfer Sit To/From Stand Outcome: Progressing Flowsheets (Taken 11/10/2019 1005) Patient will transfer sit to/from stand:  with min guard assist  with minimal assist Goal: Pt Will Transfer Bed To Chair/Chair To Bed Outcome: Progressing Flowsheets (Taken 11/10/2019 1005) Pt will Transfer Bed to Chair/Chair to Bed:  min guard assist  with min assist Goal: Pt Will Ambulate Outcome: Progressing Flowsheets (Taken 11/10/2019 1005) Pt will Ambulate:  25 feet  with min guard assist  with minimal assist  with rolling walker   10:05 AM, 11/10/19 Charles Chambers Grandchild, MPT Physical Therapist with [facility] [phone] office [phone] mobile phone

## 2019-11-10 NOTE — TOC Initial Note (Signed)
Transition of Care [facility]) - Initial/Assessment Note    Patient Details  Name: Charles Chambers MRN: [id number] Date of Birth: 05-03-1936  Transition of Care [facility]) CM/SW Contact:    Shade Flood, LCSW Phone Number: 11/10/2019, 2:57 PM  Clinical Narrative:                  Pt is observation status. He is here from home with daughter. PT recommending SNF rehab. Spoke with pt's daughter/POA by phone to discuss. Daughter expresses concern about pt's tumor under his arm that has ruptured and she questions whether pt needs surgical intervention. Informed daughter that [facility] would refer her concern to attending MD and request that he call her.   Daughter is in agreement with SNF referral. She requests referrals to [facility] and [facility].  Will initiate referrals and TOC will follow.  Expected Discharge Plan: Skilled Nursing Facility Barriers to Discharge: Continued Medical Work up   Patient Goals and CMS Choice   CMS Medicare.gov Compare Post Acute Care list provided to:: Patient Represenative (must comment) Choice offered to / list presented to : Adult Children  Expected Discharge Plan and Services Expected Discharge Plan: [place] In-house Referral: Clinical Social Work   Post Acute Care Choice: [place] Living arrangements for the past 2 months: [place]                                      Prior Living Arrangements/Services Living arrangements for the past 2 months: Single Family Home Lives with:: Adult Children Patient language and need for interpreter reviewed:: Yes        Need for Family Participation in Patient Care: Yes (Comment) Care giver support system in place?: Yes (comment)   Criminal Activity/Legal Involvement Pertinent to Current Situation/Hospitalization: No - Comment as needed  Activities of Daily Living Home Assistive Devices/Equipment: Gilford Rile (specify type) ADL Screening (condition at time  of admission) Patient's cognitive ability adequate to safely complete daily activities?: Yes Is the patient deaf or have difficulty hearing?: No Does the patient have difficulty seeing, even when wearing glasses/contacts?: No Does the patient have difficulty concentrating, remembering, or making decisions?: No Patient able to express need for assistance with ADLs?: Yes Does the patient have difficulty dressing or bathing?: No Independently performs ADLs?: Yes (appropriate for developmental age) Does the patient have difficulty walking or climbing stairs?: Yes Weakness of Legs: Both Weakness of Arms/Hands: None  Permission Sought/Granted Permission sought to share information with : Chartered certified accountant granted to share information with : Yes, Verbal Permission Granted     Permission granted to share info w AGENCY: SNFs        Emotional Assessment       Orientation: : Oriented to Self Alcohol / Substance Use: Not Applicable Psych Involvement: No (comment)  Admission diagnosis:  Spell of generalized weakness [R53.1] Patient Active Problem List   Diagnosis Date Noted  . Spell of generalized weakness 11/10/2019  . CAD (coronary artery disease) 11/10/2019  . Insulin-requiring or dependent type II diabetes mellitus ([place]) 11/10/2019  . Chronic diastolic CHF (congestive heart failure) ([place]) 11/10/2019  . Hyponatremia 11/10/2019  . Essential hypertension 11/10/2019  . 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 Goals of care, counseling/discussion 08/18/2019  . 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 Permanent atrial fibrillation ([place])  02/20/2018   PCP:  Earney Mallet, MD Pharmacy:   [facility] [place], [address] AT [address] [place] [address] Phone: [phone] Fax: [phone]     Social Determinants of Health  (SDOH) Interventions    Readmission Risk Interventions No flowsheet data found.

## 2019-11-10 NOTE — ED Notes (Signed)
Date and time results received: 11/10/19 0105 (use smartphrase ".now" to insert current time)  Test: lactic Critical Value: 2.2  Name of Provider Notified: Dr. Dayna Barker   Orders Received? Or Actions Taken?: no/na

## 2019-11-10 NOTE — Progress Notes (Signed)
ASSUMPTION OF CARE NOTE   11/10/2019 12:44 PM  Charles Chambers was seen and examined.  The H&P by the admitting provider, orders, imaging was reviewed.  Please see new orders.  Home medications have been reconciled.  Will continue to follow.   Vitals:   11/10/19 0800 11/10/19 1211  BP: 126/73 122/70  Pulse: 82 82  Resp: 14 19  Temp: 98.1 F (36.7 C) 98.4 F (36.9 C)  SpO2: 94% 98%    Results for orders placed or performed during the hospital encounter of 11/09/19  Blood Culture (routine x 2)   Specimen: BLOOD  Result Value Ref Range   Specimen Description BLOOD LEFT ANTECUBITAL    Special Requests      BOTTLES DRAWN AEROBIC AND ANAEROBIC Blood Culture adequate volume   Culture      NO GROWTH < 12 HOURS Performed at [facility], [address], [place], [place] [address]    Report Status PENDING   Blood Culture (routine x 2)   Specimen: BLOOD  Result Value Ref Range   Specimen Description BLOOD LEFT ANTECUBITAL    Special Requests      BOTTLES DRAWN AEROBIC AND ANAEROBIC Blood Culture adequate volume   Culture      NO GROWTH < 12 HOURS Performed at [facility], [address], [place], [place] [address]    Report Status PENDING   Lactic acid, plasma  Result Value Ref Range   Lactic Acid, Venous 2.2 (HH) 0.5 - 1.9 mmol/L  Lactic acid, plasma  Result Value Ref Range   Lactic Acid, Venous 1.6 0.5 - 1.9 mmol/L  Comprehensive metabolic panel  Result Value Ref Range   Sodium 127 (L) 135 - 145 mmol/L   Potassium 4.3 3.5 - 5.1 mmol/L   Chloride 92 (L) 98 - 111 mmol/L   CO2 26 22 - 32 mmol/L   Glucose, Bld 118 (H) 70 - 99 mg/dL   BUN 31 (H) 8 - 23 mg/dL   Creatinine, Ser 1.23 0.61 - 1.24 mg/dL   Calcium 9.3 8.9 - 10.3 mg/dL   Total Protein 6.8 6.5 - 8.1 g/dL   Albumin 2.8 (L) 3.5 - 5.0 g/dL   AST 15 15 - 41 U/L   ALT 14 0 - 44 U/L   Alkaline Phosphatase 75 38 - 126 U/L   Total Bilirubin 1.1 0.3 - 1.2 mg/dL   GFR calc non Af Amer 55 (L) >60 mL/min   GFR calc Af Amer  >60 >60 mL/min   Anion gap 9 5 - 15  CBC WITH DIFFERENTIAL  Result Value Ref Range   WBC 14.5 (H) 4.0 - 10.5 K/uL   RBC 3.49 (L) 4.22 - 5.81 MIL/uL   Hemoglobin 10.6 (L) 13.0 - 17.0 g/dL   HCT 33.7 (L) 39.0 - 52.0 %   MCV 96.6 80.0 - 100.0 fL   MCH 30.4 26.0 - 34.0 pg   MCHC 31.5 30.0 - 36.0 g/dL   RDW 13.7 11.5 - 15.5 %   Platelets 320 150 - 400 K/uL   nRBC 0.0 0.0 - 0.2 %   Neutrophils Relative % 86 %   Neutro Abs 12.4 (H) 1.7 - 7.7 K/uL   Lymphocytes Relative 4 %   Lymphs Abs 0.6 (L) 0.7 - 4.0 K/uL   Monocytes Relative 9 %   Monocytes Absolute 1.4 (H) 0.1 - 1.0 K/uL   Eosinophils Relative 0 %   Eosinophils Absolute 0.0 0.0 - 0.5 K/uL   Basophils Relative 0 %  Basophils Absolute 0.0 0.0 - 0.1 K/uL   Immature Granulocytes 1 %   Abs Immature Granulocytes 0.10 (H) 0.00 - 0.07 K/uL  APTT  Result Value Ref Range   aPTT 63 (H) 24 - 36 seconds  Protime-INR  Result Value Ref Range   Prothrombin Time 33.6 (H) 11.4 - 15.2 seconds   INR 3.3 (H) 0.8 - 1.2  Urinalysis, Routine w reflex microscopic  Result Value Ref Range   Color, Urine YELLOW YELLOW   APPearance CLEAR CLEAR   Specific Gravity, Urine 1.013 1.005 - 1.030   pH 5.0 5.0 - 8.0   Glucose, UA NEGATIVE NEGATIVE mg/dL   Hgb urine dipstick NEGATIVE NEGATIVE   Bilirubin Urine NEGATIVE NEGATIVE   Ketones, ur NEGATIVE NEGATIVE mg/dL   Protein, ur NEGATIVE NEGATIVE mg/dL   Nitrite NEGATIVE NEGATIVE   Leukocytes,Ua NEGATIVE NEGATIVE  CK  Result Value Ref Range   Total CK 111 49 - 397 U/L  Brain natriuretic peptide  Result Value Ref Range   B Natriuretic Peptide 177.0 (H) 0.0 - 100.0 pg/mL  Ammonia  Result Value Ref Range   Ammonia 14 9 - 35 umol/L  Basic metabolic panel  Result Value Ref Range   Sodium 134 (L) 135 - 145 mmol/L   Potassium 4.7 3.5 - 5.1 mmol/L   Chloride 97 (L) 98 - 111 mmol/L   CO2 28 22 - 32 mmol/L   Glucose, Bld 101 (H) 70 - 99 mg/dL   BUN 28 (H) 8 - 23 mg/dL   Creatinine, Ser 1.16 0.61 - 1.24  mg/dL   Calcium 9.1 8.9 - 10.3 mg/dL   GFR calc non Af Amer 59 (L) >60 mL/min   GFR calc Af Amer >60 >60 mL/min   Anion gap 9 5 - 15  Hemoglobin A1c  Result Value Ref Range   Hgb A1c MFr Bld 6.9 (H) 4.8 - 5.6 %   Mean Plasma Glucose 151.33 mg/dL  Protime-INR  Result Value Ref Range   Prothrombin Time 33.7 (H) 11.4 - 15.2 seconds   INR 3.3 (H) 0.8 - 1.2  Cortisol  Result Value Ref Range   Cortisol, Plasma 14.1 ug/dL  CBG monitoring, ED  Result Value Ref Range   Glucose-Capillary 92 70 - 99 mg/dL  CBG monitoring, ED  Result Value Ref Range   Glucose-Capillary 126 (H) 70 - 99 mg/dL  CBG monitoring, ED  Result Value Ref Range   Glucose-Capillary 268 (H) 70 - 99 mg/dL  Troponin I (High Sensitivity)  Result Value Ref Range   Troponin I (High Sensitivity) 8 <18 ng/L  Troponin I (High Sensitivity)  Result Value Ref Range   Troponin I (High Sensitivity) 7 <18 ng/L   Time Spent: 32 minutes   Murvin Natal, MD [facility]   11/09/2019 11:12 PM How to contact the [facility] Attending or Consulting provider 7A - 7P or covering provider during after hours 7P -7A, for this patient?  1. Check the care team in [facility] and look for a) attending/consulting TRH provider listed and b) the [facility] team listed 2. Log into [email or web address] and use [place]'s universal password to access. If you do not have the password, please contact the hospital operator. 3. Locate the [facility] provider you are looking for under [facility] and page to a number that you can be directly reached. 4. If you still have difficulty reaching the provider, please page the [facility] (Director on Call) for the Hospitalists listed on amion for assistance.

## 2019-11-11 ENCOUNTER — Inpatient Hospital Stay (HOSPITAL_COMMUNITY): Payer: Medicare Other

## 2019-11-11 DIAGNOSIS — Z7189 Other specified counseling: Secondary | ICD-10-CM | POA: Diagnosis not present

## 2019-11-11 DIAGNOSIS — I130 Hypertensive heart and chronic kidney disease with heart failure and stage 1 through stage 4 chronic kidney disease, or unspecified chronic kidney disease: Secondary | ICD-10-CM | POA: Diagnosis present

## 2019-11-11 DIAGNOSIS — C799 Secondary malignant neoplasm of unspecified site: Secondary | ICD-10-CM

## 2019-11-11 DIAGNOSIS — Z85828 Personal history of other malignant neoplasm of skin: Secondary | ICD-10-CM | POA: Diagnosis not present

## 2019-11-11 DIAGNOSIS — Z87891 Personal history of nicotine dependence: Secondary | ICD-10-CM | POA: Diagnosis not present

## 2019-11-11 DIAGNOSIS — T502X5A Adverse effect of carbonic-anhydrase inhibitors, benzothiadiazides and other diuretics, initial encounter: Secondary | ICD-10-CM | POA: Diagnosis present

## 2019-11-11 DIAGNOSIS — I252 Old myocardial infarction: Secondary | ICD-10-CM | POA: Diagnosis not present

## 2019-11-11 DIAGNOSIS — E871 Hypo-osmolality and hyponatremia: Secondary | ICD-10-CM | POA: Diagnosis not present

## 2019-11-11 DIAGNOSIS — I4821 Permanent atrial fibrillation: Secondary | ICD-10-CM

## 2019-11-11 DIAGNOSIS — E1122 Type 2 diabetes mellitus with diabetic chronic kidney disease: Secondary | ICD-10-CM | POA: Diagnosis present

## 2019-11-11 DIAGNOSIS — I2510 Atherosclerotic heart disease of native coronary artery without angina pectoris: Secondary | ICD-10-CM | POA: Diagnosis not present

## 2019-11-11 DIAGNOSIS — R531 Weakness: Secondary | ICD-10-CM | POA: Diagnosis not present

## 2019-11-11 DIAGNOSIS — Z955 Presence of coronary angioplasty implant and graft: Secondary | ICD-10-CM | POA: Diagnosis not present

## 2019-11-11 DIAGNOSIS — W19XXXA Unspecified fall, initial encounter: Secondary | ICD-10-CM | POA: Diagnosis not present

## 2019-11-11 DIAGNOSIS — Z79899 Other long term (current) drug therapy: Secondary | ICD-10-CM | POA: Diagnosis not present

## 2019-11-11 DIAGNOSIS — Z7982 Long term (current) use of aspirin: Secondary | ICD-10-CM | POA: Diagnosis not present

## 2019-11-11 DIAGNOSIS — E861 Hypovolemia: Secondary | ICD-10-CM | POA: Diagnosis present

## 2019-11-11 DIAGNOSIS — Z7901 Long term (current) use of anticoagulants: Secondary | ICD-10-CM | POA: Diagnosis not present

## 2019-11-11 DIAGNOSIS — C44529 Squamous cell carcinoma of skin of other part of trunk: Secondary | ICD-10-CM | POA: Diagnosis present

## 2019-11-11 DIAGNOSIS — E785 Hyperlipidemia, unspecified: Secondary | ICD-10-CM | POA: Diagnosis present

## 2019-11-11 DIAGNOSIS — Z20822 Contact with and (suspected) exposure to covid-19: Secondary | ICD-10-CM | POA: Diagnosis present

## 2019-11-11 DIAGNOSIS — Z794 Long term (current) use of insulin: Secondary | ICD-10-CM | POA: Diagnosis not present

## 2019-11-11 DIAGNOSIS — Z9981 Dependence on supplemental oxygen: Secondary | ICD-10-CM | POA: Diagnosis not present

## 2019-11-11 DIAGNOSIS — L8990 Pressure ulcer of unspecified site, unspecified stage: Secondary | ICD-10-CM | POA: Insufficient documentation

## 2019-11-11 DIAGNOSIS — IMO0002 Reserved for concepts with insufficient information to code with codable children: Secondary | ICD-10-CM

## 2019-11-11 DIAGNOSIS — S41101A Unspecified open wound of right upper arm, initial encounter: Secondary | ICD-10-CM | POA: Diagnosis not present

## 2019-11-11 DIAGNOSIS — E1151 Type 2 diabetes mellitus with diabetic peripheral angiopathy without gangrene: Secondary | ICD-10-CM | POA: Diagnosis present

## 2019-11-11 DIAGNOSIS — I5032 Chronic diastolic (congestive) heart failure: Secondary | ICD-10-CM | POA: Diagnosis not present

## 2019-11-11 DIAGNOSIS — K5900 Constipation, unspecified: Secondary | ICD-10-CM | POA: Diagnosis present

## 2019-11-11 DIAGNOSIS — E1140 Type 2 diabetes mellitus with diabetic neuropathy, unspecified: Secondary | ICD-10-CM | POA: Diagnosis present

## 2019-11-11 DIAGNOSIS — D631 Anemia in chronic kidney disease: Secondary | ICD-10-CM | POA: Diagnosis present

## 2019-11-11 DIAGNOSIS — C44622 Squamous cell carcinoma of skin of right upper limb, including shoulder: Secondary | ICD-10-CM

## 2019-11-11 DIAGNOSIS — Z515 Encounter for palliative care: Secondary | ICD-10-CM | POA: Diagnosis not present

## 2019-11-11 LAB — COMPREHENSIVE METABOLIC PANEL
ALT: 13 U/L (ref 0–44)
AST: 10 U/L — ABNORMAL LOW (ref 15–41)
Albumin: 2.3 g/dL — ABNORMAL LOW (ref 3.5–5.0)
Alkaline Phosphatase: 56 U/L (ref 38–126)
Anion gap: 7 (ref 5–15)
BUN: 32 mg/dL — ABNORMAL HIGH (ref 8–23)
CO2: 27 mmol/L (ref 22–32)
Calcium: 8.9 mg/dL (ref 8.9–10.3)
Chloride: 98 mmol/L (ref 98–111)
Creatinine, Ser: 1.15 mg/dL (ref 0.61–1.24)
GFR calc Af Amer: >60 mL/min (ref >60)
GFR calc non Af Amer: 59 mL/min — ABNORMAL LOW (ref >60)
Glucose, Bld: 197 mg/dL — ABNORMAL HIGH (ref 70–99)
Potassium: 4.7 mmol/L (ref 3.5–5.1)
Sodium: 132 mmol/L — ABNORMAL LOW (ref 135–145)
Total Bilirubin: 0.5 mg/dL (ref 0.3–1.2)
Total Protein: 5.7 g/dL — ABNORMAL LOW (ref 6.5–8.1)

## 2019-11-11 LAB — CBC WITH DIFFERENTIAL/PLATELET
Abs Immature Granulocytes: 0.09 10*3/uL — ABNORMAL HIGH (ref 0.00–0.07)
Basophils Absolute: 0 10*3/uL (ref 0.0–0.1)
Basophils Relative: 0 %
Eosinophils Absolute: 0 10*3/uL (ref 0.0–0.5)
Eosinophils Relative: 0 %
HCT: 30.2 % — ABNORMAL LOW (ref 39.0–52.0)
Hemoglobin: 9.2 g/dL — ABNORMAL LOW (ref 13.0–17.0)
Immature Granulocytes: 1 %
Lymphocytes Relative: 5 %
Lymphs Abs: 0.5 10*3/uL — ABNORMAL LOW (ref 0.7–4.0)
MCH: 29.5 pg (ref 26.0–34.0)
MCHC: 30.5 g/dL (ref 30.0–36.0)
MCV: 96.8 fL (ref 80.0–100.0)
Monocytes Absolute: 0.9 10*3/uL (ref 0.1–1.0)
Monocytes Relative: 9 %
Neutro Abs: 8.8 10*3/uL — ABNORMAL HIGH (ref 1.7–7.7)
Neutrophils Relative %: 85 %
Platelets: 258 10*3/uL (ref 150–400)
RBC: 3.12 MIL/uL — ABNORMAL LOW (ref 4.22–5.81)
RDW: 13.8 % (ref 11.5–15.5)
WBC: 10.3 10*3/uL (ref 4.0–10.5)
nRBC: 0 % (ref 0.0–0.2)

## 2019-11-11 LAB — URINE CULTURE: Culture: NO GROWTH

## 2019-11-11 LAB — GLUCOSE, CAPILLARY
Glucose-Capillary: 173 mg/dL — ABNORMAL HIGH (ref 70–99)
Glucose-Capillary: 228 mg/dL — ABNORMAL HIGH (ref 70–99)
Glucose-Capillary: 216 mg/dL — ABNORMAL HIGH (ref 70–99)
Glucose-Capillary: 220 mg/dL — ABNORMAL HIGH (ref 70–99)

## 2019-11-11 LAB — MAGNESIUM: Magnesium: 1.7 mg/dL (ref 1.7–2.4)

## 2019-11-11 LAB — PROTIME-INR
INR: 2.9 — ABNORMAL HIGH (ref 0.8–1.2)
Prothrombin Time: 30 seconds — ABNORMAL HIGH (ref 11.4–15.2)

## 2019-11-11 MED ORDER — WARFARIN SODIUM 2.5 MG PO TABS
2.5000 mg | ORAL_TABLET | Freq: Once | ORAL | Status: AC
  Administered 2019-11-11: 2.5 mg via ORAL
  Filled 2019-11-11: qty 1

## 2019-11-11 MED ORDER — INSULIN ASPART 100 UNIT/ML ~~LOC~~ SOLN
7.0000 [IU] | Freq: Three times a day (TID) | SUBCUTANEOUS | Status: DC
  Administered 2019-11-11 – 2019-11-16 (×15): 7 [IU] via SUBCUTANEOUS

## 2019-11-11 NOTE — Care Management Obs Status (Signed)
[place] NOTIFICATION   Patient Details  Name: Charles Chambers MRN: [id number] Date of Birth: [birth year]   Medicare Observation Status Notification Given:  Other (see comment)(a copy was placed in patient room for family member to pick up during visitation)    [name] 11/11/2019, 10:04 AM

## 2019-11-11 NOTE — Progress Notes (Signed)
Physical Therapy Note  Patient Details  Name: Charles Chambers MRN: [id number] Date of Birth: 1936/06/07 Today's Date: 11/11/2019    MD in with patient during therapist's available treatment time.  Will try again tomorrow.  Teena Irani, PTA/CLT [phone]    Roseanne Reno B 11/11/2019, 5:24 PM

## 2019-11-11 NOTE — Progress Notes (Signed)
ANTICOAGULATION CONSULT NOTE -  Pharmacy Consult for warfarin dosing Indication: atrial fibrillation  Allergies  Allergen Reactions  . Naproxen Sodium Other (See Comments) and Nausea And Vomiting    Due to kidney function  . Contrast Media [Iodinated Diagnostic Agents]     : No contrast media dye due to fluctuations with renal function."  . Nsaids     Poor kidney function   . Other Other (See Comments)    Narcotics cause depression, especially codeine Contrast products can not be used due to kidney function   . Baclofen Other (See Comments)    HALLUCINATIONS    . Codeine     depression  . Mushroom Extract Complex Nausea And Vomiting  . Neurontin [Gabapentin] Other (See Comments)    overstimulation    Patient Measurements: Height: 5\' 9"  (175.3 cm) Weight: 262 lb (118.8 kg) IBW/kg (Calculated) : 70.7 Heparin Dosing Weight: HEPARIN DW (KG): 97.5  Vital Signs: Temp: 97.6 F (36.4 C) (01/21 0447) Temp Source: Oral (01/21 0447) BP: 123/94 (01/21 0447) Pulse Rate: 110 (01/21 0447)  Labs: Recent Labs    11/09/19 2335 11/10/19 0128 11/10/19 0438 11/11/19 0625  HGB 10.6*  --   --  9.2*  HCT 33.7*  --   --  30.2*  PLT 320  --   --  258  APTT 63*  --   --   --   LABPROT 33.6*  --  33.7* 30.0*  INR 3.3*  --  3.3* 2.9*  CREATININE 1.23  --  1.16 1.15  CKTOTAL 111  --   --   --   TROPONINIHS 8 7  --   --     Estimated Creatinine Clearance: 64.1 mL/min (by C-G formula based on SCr of 1.15 mg/dL).   Medical History: Past Medical History:  Diagnosis Date  . Abnormal gait   . Atrial fibrillation ([place])   . CAD (coronary artery disease)   . 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 Chronic back pain   . Complication of anesthesia    supraclavicular block caused trouble breathing and heartrate dropped  . Congestive heart failure with left ventricular diastolic dysfunction, NYHA class 2 ([place])   . Diabetes mellitus    INSULIN DEPENDENT,  CONTROLLED Type 2  . Diabetic neuropathy ([place])   . Diabetic neuropathy ([place])   . Diverticulosis of colon   . Hx of colonoscopy    10 YEARS AGO  . Hx of nonmelanoma skin cancer   . Hx of osteomyelitis   . Hyperlipidemia   . Hypertension    BENIGN ESSENTIAL  . Lower extremity venous stasis   . MI (myocardial infarction) ([place]) 09/06/1995  . Mild renal insufficiency    decreased kidney function  . Moderate mitral regurgitation   . Morbid obesity with BMI of 40.0-44.9, adult ([place])   . Obstructive sleep apnea on CPAP    does not wear CPAP, wears 2L O2 [place]  . Osteoarthritis, multiple sites   . Osteomyelitis of vertebra of lumbar region ([place])   . Pneumonia   . Status post coronary artery stent placement 09/09/1995  . Vitamin D deficiency      Assessment: Pharmacy consulted to dose warfarin for this 84 yo male cancer patient with atrial fibrillation and has been on chronic anti-coagulation with warfarin for this condition.   hgb 9.2 Platelets: 258    INR: 2.9 Home dose: warfarin 5mg  on Tues/Thurs/Sun and  2.5 mg ROW   Goal of  Therapy:  INR 2-3 Monitor platelets by anticoagulation protocol: Yes   Plan:  Warfarin 2.5 mg x 1 dose Daily INR and CBC Monitor for signs and symptoms of bleeding.  Margot Ables, PharmD Clinical Pharmacist 11/11/2019 8:42 AM

## 2019-11-11 NOTE — Progress Notes (Signed)
PT Cancellation Note  Patient Details Name: Charles Chambers MRN: [id number] DOB: [birth year]   Cancelled Treatment:    Reason Eval/Treat Not Completed: Fatigue/lethargy limiting ability to participate;Patient declined, no reason specified Attempted PT session.  Pt declined, stated he was too fatigued and too late (6:00 pm).  Stated he will try tomorrow.  [address] LPTA; [place]  Aldona Lento 11/11/2019, 6:14 PM

## 2019-11-11 NOTE — Progress Notes (Signed)
Patient refused to go to CT scan tonight.  Patient was upset and agitated and requested to be rescanned tomorrow.  Notified mid-level of patient decision.

## 2019-11-11 NOTE — TOC Progression Note (Signed)
Transition of Care [facility]) - Progression Note    Patient Details  Name: Charles Chambers MRN: [id number] Date of Birth: 08/19/1936  Transition of Care [facility]) CM/SW Contact  Elanora Quin, Chauncey Reading, RN Phone Number: 11/11/2019, 4:04 PM  Clinical Narrative:   Marvetta Gibbons unable to make bed offer. Awaiting call back from [facility]. Noted surgical consult and recommendations.     Expected Discharge Plan: [place] Barriers to Discharge: Continued Medical Work up  Expected Discharge Plan and Services Expected Discharge Plan: [place] In-house Referral: Clinical Social Work   Post Acute Care Choice: [facility] Living arrangements for the past 2 months: Single Family Home                                       Social Determinants of Health (SDOH) Interventions    Readmission Risk Interventions No flowsheet data found.

## 2019-11-11 NOTE — Progress Notes (Addendum)
PROGRESS NOTE Charles Chambers   Charles Chambers  [id number]  DOB: [birth year]  DOA: 11/09/2019 PCP: Earney Mallet, MD   Brief Admission Hx: 84 y.o. 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   MDM/Assessment & Plan:   Generalized weakness due to hyponatremia-PT has evaluated the patient and they have strongly recommended SNF placement.  Patient agreeable.  I asked the social workers to help with placement. Hyponatremia-likely was secondary to diuretics which is currently being held.  Sodium is improving.   Atrial Fibrillation -his heart rate remains controlled.  He is fully anticoagulated with warfarin which we are monitoring closely. Chronic diastolic CHF-he presented hypovolemic and his diuretics were held temporarily. Metastatic squamous cell cancer involving the right axillary area-the mass has been treated with immunotherapy but does not seem to be responding favorably.  Surgery consulted by family request for recommendations.  Unfortunately this lesion is not amenable to surgical treatment.  I have asked for oncology to give an opinion regarding palliative radiation.  I have also asked for palliative medicine consultation. Squamous cell carcinoma-he completed third cycle of immunotherapy 1 month ago and was scheduled to have further immunotherapy yesterday but missed the appointment due to being in the hospital.  DVT prophylaxis: Warfarin Code Status: Full Family Communication: Daughter Disposition Plan: Inpatient for IV antibiotics and subspecialty consultation   Consultants: General surgery Palliative medicine Oncology  Procedures:   Antimicrobials:     Subjective: Patient reports that he is agreeable to SNF placement for rehabilitation.  Objective: Vitals:   11/10/19 1430 11/10/19 1605 11/10/19 2046 11/11/19 0447  BP: 120/67 (!) 154/125 123/62 (!) 123/94  Pulse: 75 84 75 (!) 110  Resp: 20 16 15 20   Temp: [id number] F (36.8 C) 97.7 F (36.5 C) 98 F (36.7 C) 97.6 F (36.4 C)  TempSrc: Oral Oral Oral Oral  SpO2: 100% 98% 100% 100%  Weight:      Height:        Intake/Output Summary (Last 24 hours) at 11/11/2019 1025 Last data filed at 11/11/2019 1002 Gross per 24 hour  Intake 507.49 ml  Output 1550 ml  Net -1042.51 ml   Filed Weights   11/09/19 2051 11/09/19 2052  Weight: 118.8 kg 118.8 kg   REVIEW OF SYSTEMS  As per history otherwise all reviewed and reported negative  Exam:  General exam: Chronically ill-appearing male he is lying in bed he is awake and alert and oriented x3 in no apparent distress. Respiratory system: Clear. No increased work of breathing. Cardiovascular system: S1 & S2 heard. No JVD, murmurs, gallops, clicks or pedal edema. Gastrointestinal system: Abdomen is nondistended, soft and nontender. Normal bowel sounds heard. Central nervous system: Alert and oriented. No focal neurological deficits. Extremities: Large ulcerated fungating mass under the right axillary area with serous drainage and foul smell.  Data Reviewed: Basic Metabolic Panel: Recent Labs  Lab 11/09/19 2335 11/10/19 0438 11/11/19 0625  NA 127* 134* 132*  K 4.3 4.7 4.7  CL 92* 97* 98  CO2 26 28 27   GLUCOSE 118* 101* 197*  BUN 31* 28* 32*  CREATININE 1.23 1.16 1.15  CALCIUM 9.3 9.1 8.9  MG  --   --  1.7   Liver Function Tests: Recent Labs  Lab 11/09/19 2335 11/11/19 0625  AST 15 10*  ALT 14 13  ALKPHOS 75 56  BILITOT 1.1 0.5  PROT 6.8 5.7*  ALBUMIN 2.8* 2.3*   No results for input(s): LIPASE, AMYLASE in the last 168 hours. Recent Labs  Lab 11/09/19 2337  AMMONIA 14   CBC: Recent Labs  Lab 11/09/19 2335  11/11/19 0625  WBC 14.5* 10.3  NEUTROABS 12.4* 8.8*  HGB 10.6* 9.2*  HCT 33.7* 30.2*  MCV 96.6 96.8  PLT 320 258   Cardiac Enzymes: Recent Labs  Lab 11/09/19 2335  CKTOTAL 111   CBG (last 3)  Recent Labs    11/10/19 1622 11/10/19 2047 11/11/19 0714  GLUCAP 280* 225* 173*   Recent Results (from the past 240 hour(s))  Blood Culture (routine x 2)     Status: None (Preliminary result)   Collection Time: 11/09/19 11:35 PM   Specimen: BLOOD  Result Value Ref Range Status   Specimen Description BLOOD LEFT ANTECUBITAL  Final   Special Requests   Final    BOTTLES DRAWN AEROBIC AND ANAEROBIC Blood Culture adequate volume   Culture   Final    NO GROWTH 1 DAY Performed at [facility], [address] [place], [place] [address]    Report Status PENDING  Incomplete  Blood Culture (routine x 2)     Status: None (Preliminary result)   Collection Time: 11/10/19 12:42 AM   Specimen: BLOOD  Result Value Ref Range Status   Specimen Description BLOOD LEFT ANTECUBITAL  Final   Special Requests   Final    BOTTLES DRAWN AEROBIC AND ANAEROBIC Blood Culture adequate volume   Culture   Final    NO GROWTH 1 DAY Performed at [facility], [address] [place], [place] [address]    Report Status PENDING  Incomplete  Urine culture     Status: None   Collection Time: 11/10/19  1:06 AM   Specimen: In/Out Cath Urine  Result Value Ref Range Status   Specimen Description   Final    IN/OUT CATH URINE Performed at [facility], [address], [place], [place] [address]    Special Requests   Final    NONE Performed at [facility], [address] [place], [place] [address]    Culture   Final    NO GROWTH Performed at [facility], [place] [address] [place], [place] [address]    Report Status 11/11/2019 FINAL  Final  SARS CORONAVIRUS 2 (TAT 6-24 HRS) Nasopharyngeal Nasopharyngeal Swab     Status: None   Collection Time: 11/10/19  2:33 AM   Specimen: Nasopharyngeal Swab  Result Value  Ref Range Status   SARS Coronavirus 2 NEGATIVE NEGATIVE Final    Comment: (NOTE) SARS-CoV-2 target nucleic acids are NOT DETECTED. The SARS-CoV-2 RNA is generally detectable in upper and lower respiratory specimens during the acute phase of infection. Negative results do not preclude SARS-CoV-2 infection, do not rule out co-infections with other pathogens, and should not be used as the sole basis for treatment or other patient management decisions. Negative results must be combined with clinical observations, patient history, and epidemiological information. The expected result is Negative. Fact Sheet for Patients: SugarRoll.be Fact Sheet for Healthcare Providers: https://www.woods-mathews.com/ This test is not yet approved or cleared by the [place] FDA and  has been authorized for detection and/or diagnosis of SARS-CoV-2 by FDA under an Emergency Use Authorization (EUA). This EUA will remain  in effect (meaning this test can  be used) for the duration of the COVID-19 declaration under Section 56 4(b)(1) of the Act, 21 U.S.C. section 360bbb-3(b)(1), unless the authorization is terminated or revoked sooner. Performed at [facility], [place] [address], [place], [place] [address]      Studies: CT Head Wo Contrast  Result Date: 11/10/2019 CLINICAL DATA:  Head trauma and headache. Fall. EXAM: CT HEAD WITHOUT CONTRAST TECHNIQUE: Contiguous axial images were obtained from the base of the skull through the vertex without intravenous contrast. COMPARISON:  None. FINDINGS: Brain: There is no mass, hemorrhage or extra-axial collection. There is generalized atrophy without lobar predilection. Hypodensity of the white matter is most commonly associated with chronic microvascular disease. Vascular: Atherosclerotic calcification of the vertebral and internal carotid arteries at the skull base. No abnormal hyperdensity of the major intracranial arteries  or dural venous sinuses. Skull: The visualized skull base, calvarium and extracranial soft tissues are normal. Sinuses/Orbits: No fluid levels or advanced mucosal thickening of the visualized paranasal sinuses. No mastoid or middle ear effusion. The orbits are normal. IMPRESSION: Chronic small vessel disease and generalized volume loss without acute intracranial abnormality. Electronically Signed   By: Ulyses Jarred M.D.   On: 11/10/2019 00:43   DG Chest Port 1 View  Result Date: 11/10/2019 CLINICAL DATA:  Weakness. Fall yesterday. EXAM: PORTABLE CHEST 1 VIEW COMPARISON:  None. FINDINGS: Cardiomegaly. Aortic atherosclerosis. Streaky left lower lobe atelectasis. No confluent airspace disease, pulmonary edema, large pleural effusion or pneumothorax. No acute osseous abnormalities. Advanced chronic degenerative changes of both shoulders. IMPRESSION: Cardiomegaly with left lower lobe atelectasis. Aortic Atherosclerosis (ICD10-I70.0). Electronically Signed   By: Keith Rake M.D.   On: 11/10/2019 00:36     Scheduled Meds:  atorvastatin  20 mg Oral QHS   carvedilol  12.5 mg Oral BID WC   cholecalciferol  3,000 Units Oral Daily   insulin aspart  0-5 Units Subcutaneous QHS   insulin aspart  0-9 Units Subcutaneous TID WC   insulin aspart  5 Units Subcutaneous TID WC   insulin glargine  18 Units Subcutaneous Daily   nystatin   Topical BID   silver sulfADIAZINE   Topical BID   sodium chloride flush  3 mL Intravenous Q12H   warfarin  2.5 mg Oral ONCE-1800   Warfarin - Pharmacist Dosing Inpatient   Does not apply q1800   Continuous Infusions:  cefTRIAXone (ROCEPHIN)  IV 200 mL/hr at 11/11/19 0400    Principal Problem:   Spell of generalized weakness Active Problems:   Permanent atrial fibrillation (HCC)   Squamous cell cancer of skin of upper arm, right   CAD (coronary artery disease)   Insulin-requiring or dependent type II diabetes mellitus (HCC)   Chronic diastolic CHF (congestive heart  failure) (HCC)   Hyponatremia   Essential hypertension   Pressure injury of skin   Time spent:   Irwin Brakeman, MD [facility] 11/11/2019, 10:25 AM    LOS: 0 days  How to contact the [facility] Attending or Consulting provider [place] or covering provider during after hours [place], for this patient?  Check the care team in [facility] and look for a) attending/consulting TRH provider listed and b) the [facility] team listed Log into [email or web address] and use [place]'s universal password to access. If you do not have the password, please contact the hospital operator. Locate the [facility] provider you are looking for under [facility] and page to a number that you can be directly reached. If you still have difficulty  reaching the provider, please page the [facility] (Director on Call) for the Hospitalists listed on amion for assistance.

## 2019-11-11 NOTE — Consult Note (Signed)
[facility] Consultation Oncology  Name: Charles Chambers      MRN: [id number]    Location: A309/A309-01  Date: 11/11/2019 Time:8:48 PM   REFERRING PHYSICIAN: Dr. Wynetta Emery  REASON FOR CONSULT: Metastatic squamous cell carcinoma   DIAGNOSIS: Metastatic squamous cell carcinoma  HISTORY OF PRESENT ILLNESS: Charles Chambers is a 84 year old white male seen in consultation today at the request of Dr. Wynetta Emery to discuss the state of his disease and prognosis.  He was admitted with generalized weakness.  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  He was started on Cemiplimab on 08/25/2019, last dose on 10/12/2019.  He follows up with Dr. Julieanne Manson at [facility].  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  He reportedly lives at home by himself and is independent of all ADLs until this admission.  He also has history of atrial fibrillation, on warfarin.  He also has chronic diastolic heart failure.  PAST MEDICAL HISTORY:   Past Medical History:  Diagnosis Date  . Abnormal gait   . Atrial fibrillation ([place])   . CAD (coronary artery disease)   . Cancer ([place]) 07/11/2016   of parotid gland, s/p parotidectomy  . Chronic back pain   . Complication of anesthesia    supraclavicular block caused trouble breathing and heartrate dropped  . Congestive heart failure with left ventricular diastolic dysfunction, NYHA class 2 ([place])   . Diabetes mellitus    INSULIN DEPENDENT, CONTROLLED Type 2  . Diabetic neuropathy ([place])   . Diabetic neuropathy ([place])   . Diverticulosis of colon   . Hx of colonoscopy    10 YEARS AGO  . Hx of nonmelanoma skin cancer   . Hx of osteomyelitis   .  Hyperlipidemia   . Hypertension    BENIGN ESSENTIAL  . Lower extremity venous stasis   . MI (myocardial infarction) ([place]) 09/06/1995  . Mild renal insufficiency    decreased kidney function  . Moderate mitral regurgitation   . Morbid obesity with BMI of 40.0-44.9, adult ([place])   . Obstructive sleep apnea on CPAP    does not wear CPAP, wears 2L O2 [place]  . Osteoarthritis, multiple sites   . Osteomyelitis of vertebra of lumbar region ([place])   . Pneumonia   . Status post coronary artery stent placement 09/09/1995  . Vitamin D deficiency     ALLERGIES: Allergies  Allergen Reactions  . Naproxen Sodium Other (See Comments) and Nausea And Vomiting    Due to kidney function  . Contrast Media [Iodinated Diagnostic Agents]     : No contrast media dye due to fluctuations with renal function."  . Nsaids     Poor kidney function   . Other Other (See Comments)    Narcotics cause depression, especially codeine Contrast products can not be used due to kidney function   . Baclofen Other (See Comments)    HALLUCINATIONS    . Codeine     depression  . Mushroom Extract Complex Nausea And Vomiting  . Neurontin [Gabapentin] Other (See Comments)    overstimulation      MEDICATIONS: I have reviewed the patient's current medications.     PAST SURGICAL HISTORY Past Surgical History:  Procedure  Laterality Date  . AMPUTATION Right 06/05/2018   Procedure: RIGHT THUMB REVISION AMPUTATION;  Surgeon: Charlotte Crumb, MD;  Location: [facility];  Service: Orthopedics;  Laterality: Right;  . AMPUTATION Right 03/20/2019   Procedure: Right thumb amputation with neurolysis and rotation flap coverage as necessary;  Surgeon: Roseanne Kaufman, MD;  Location: [facility];  Service: Orthopedics;  Laterality: Right;  60 mins  . ANGIOPLASTY  08/1995   STENT PLACEMENT  . ARM/ELBOW SURGERY  2016  . BACK SURGERY    . CORONARY STENT PLACEMENT  09/09/1995  . I & D EXTREMITY Right 03/23/2018   Procedure: IRRIGATION AND  DEBRIDEMENT WITH INTERPHANGEAL JOINT [place] ROTATION FLAP;  Surgeon: Charlotte Crumb, MD;  Location: [facility];  Service: Orthopedics;  Laterality: Right;  . LAMINECTOMY     OPEN BIOPSY OF L4-S1  . PAROTIDECTOMY Right 06/2016  . POSTERIOR FUSION FOR OSTEOMYLITIS  11/2008  . REMOVAL OF CYST FROM EYELID  10/2010    FAMILY HISTORY: Family History  Problem Relation Age of Onset  . Heart disease Mother 36  . Heart disease Father     SOCIAL HISTORY:  reports that he has quit smoking. His smoking use included cigarettes. He has never used smokeless tobacco. He reports that he does not drink alcohol or use drugs.  PERFORMANCE STATUS: The patient's performance status is 3 - Symptomatic, >50% confined to bed  PHYSICAL EXAM: Most Recent Vital Signs: Blood pressure (!) 102/45, pulse 97, temperature 98 F (36.7 C), resp. rate 18, height 5\' 9"  (1.753 m), weight 262 lb (118.8 kg), SpO2 100 %. BP (!) 102/45   Pulse 97   Temp 98 F (36.7 C)   Resp 18   Ht 5\' 9"  (1.753 m)   Wt 262 lb (118.8 kg)   SpO2 100%   BMI 38.69 kg/m  General appearance: alert, cooperative and appears stated age Head: Normocephalic, without obvious abnormality, atraumatic Neck: no adenopathy and supple, symmetrical, trachea midline Lungs: Bilateral air entry. Heart: irregularly irregular rhythm Abdomen: soft, non-tender; bowel sounds normal; no masses,  no organomegaly Extremities: Bilateral lower extremity swelling with hyperpigmentation present.  Right hand thumb is missing from excision. Skin: Right axillary area has open wound. Lymph nodes: Cervical, supraclavicular, and axillary nodes normal. and Right axillary soft tissue mass measuring 8 x 5 cm, with odor.  LABORATORY DATA:  Results for orders placed or performed during the hospital encounter of 11/09/19 (from the past 48 hour(s))  Lactic acid, plasma     Status: Abnormal   Collection Time: 11/09/19 11:35 PM  Result Value Ref Range   Lactic Acid, Venous  2.2 (HH) 0.5 - 1.9 mmol/L    Comment: CRITICAL RESULT CALLED TO, READ BACK BY AND VERIFIED WITH: T TALBOT,RN @0105  11/10/19 MKELLY Performed at [facility], [address] [place], [place] [address]   Comprehensive metabolic panel     Status: Abnormal   Collection Time: 11/09/19 11:35 PM  Result Value Ref Range   Sodium 127 (L) 135 - 145 mmol/L   Potassium 4.3 3.5 - 5.1 mmol/L   Chloride 92 (L) 98 - 111 mmol/L   CO2 26 22 - 32 mmol/L   Glucose, Bld 118 (H) 70 - 99 mg/dL   BUN 31 (H) 8 - 23 mg/dL   Creatinine, Ser 1.23 0.61 - 1.24 mg/dL   Calcium 9.3 8.9 - 10.3 mg/dL   Total Protein 6.8 6.5 - 8.1 g/dL   Albumin 2.8 (L) 3.5 - 5.0 g/dL   AST 15 15 -  41 U/L   ALT 14 0 - 44 U/L   Alkaline Phosphatase 75 38 - 126 U/L   Total Bilirubin 1.1 0.3 - 1.2 mg/dL   GFR calc non Af Amer 55 (L) >60 mL/min   GFR calc Af Amer >60 >60 mL/min   Anion gap 9 5 - 15    Comment: Performed at [facility], [address], [place], [place] [address]  CBC WITH DIFFERENTIAL     Status: Abnormal   Collection Time: 11/09/19 11:35 PM  Result Value Ref Range   WBC 14.5 (H) 4.0 - 10.5 K/uL   RBC 3.49 (L) 4.22 - 5.81 MIL/uL   Hemoglobin 10.6 (L) 13.0 - 17.0 g/dL   HCT 33.7 (L) 39.0 - 52.0 %   MCV 96.6 80.0 - 100.0 fL   MCH 30.4 26.0 - 34.0 pg   MCHC 31.5 30.0 - 36.0 g/dL   RDW 13.7 11.5 - 15.5 %   Platelets 320 150 - 400 K/uL   nRBC 0.0 0.0 - 0.2 %   Neutrophils Relative % 86 %   Neutro Abs 12.4 (H) 1.7 - 7.7 K/uL   Lymphocytes Relative 4 %   Lymphs Abs 0.6 (L) 0.7 - 4.0 K/uL   Monocytes Relative 9 %   Monocytes Absolute 1.4 (H) 0.1 - 1.0 K/uL   Eosinophils Relative 0 %   Eosinophils Absolute 0.0 0.0 - 0.5 K/uL   Basophils Relative 0 %   Basophils Absolute 0.0 0.0 - 0.1 K/uL   Immature Granulocytes 1 %   Abs Immature Granulocytes 0.10 (H) 0.00 - 0.07 K/uL    Comment: Performed at [facility], [address] [place], [place] [address]  APTT     Status: Abnormal   Collection Time: 11/09/19 11:35 PM   Result Value Ref Range   aPTT 63 (H) 24 - 36 seconds    Comment:        IF BASELINE aPTT IS ELEVATED, SUGGEST PATIENT RISK ASSESSMENT BE USED TO DETERMINE APPROPRIATE ANTICOAGULANT THERAPY. Performed at [facility], [address], [place], [place] [address]   Protime-INR     Status: Abnormal   Collection Time: 11/09/19 11:35 PM  Result Value Ref Range   Prothrombin Time 33.6 (H) 11.4 - 15.2 seconds   INR 3.3 (H) 0.8 - 1.2    Comment: (NOTE) INR goal varies based on device and disease states. Performed at [facility], [address] [place], [place] [address]   Blood Culture (routine x 2)     Status: None (Preliminary result)   Collection Time: 11/09/19 11:35 PM   Specimen: BLOOD  Result Value Ref Range   Specimen Description BLOOD LEFT ANTECUBITAL    Special Requests      BOTTLES DRAWN AEROBIC AND ANAEROBIC Blood Culture adequate volume   Culture      NO GROWTH 1 DAY Performed at [facility], [address], [place], [place] [address]    Report Status PENDING   CK     Status: None   Collection Time: 11/09/19 11:35 PM  Result Value Ref Range   Total CK 111 49 - 397 U/L    Comment: Performed at [facility], [address] [place],  [address]  Troponin I (High Sensitivity)     Status: None   Collection Time: 11/09/19 11:35 PM  Result Value Ref Range   Troponin I (High Sensitivity) 8 <18 ng/L    Comment: (NOTE) Elevated high sensitivity troponin I (hsTnI) values and significant  changes across  serial measurements may suggest ACS but many other  chronic and acute conditions are known to elevate hsTnI results.  Refer to the "Links" section for chest pain algorithms and additional  guidance. Performed at [facility], [address], [place], [place] [address]   Brain natriuretic peptide     Status: Abnormal   Collection Time: 11/09/19 11:37 PM  Result Value Ref Range   B Natriuretic Peptide 177.0 (H) 0.0 - 100.0 pg/mL    Comment: Performed at [facility], [address], [place], [place] [address]  Ammonia     Status: None   Collection Time: 11/09/19 11:37 PM  Result Value Ref Range   Ammonia 14 9 - 35 umol/L    Comment: Performed at [facility], [address] [place], [place] [address]  Blood Culture (routine x 2)     Status: None (Preliminary result)   Collection Time: 11/10/19 12:42 AM   Specimen: BLOOD  Result Value Ref Range   Specimen Description BLOOD LEFT ANTECUBITAL    Special Requests      BOTTLES DRAWN AEROBIC AND ANAEROBIC Blood Culture adequate volume   Culture      NO GROWTH 1 DAY Performed at [facility], [address], [place], [place] [address]    Report Status PENDING   Urinalysis, Routine w reflex microscopic     Status: None   Collection Time: 11/10/19  1:06 AM  Result Value Ref Range   Color, Urine YELLOW YELLOW   APPearance CLEAR CLEAR   Specific Gravity, Urine 1.013 1.005 - 1.030   pH 5.0 5.0 - 8.0   Glucose, UA NEGATIVE NEGATIVE mg/dL   Hgb urine dipstick NEGATIVE NEGATIVE   Bilirubin Urine NEGATIVE NEGATIVE   Ketones, ur NEGATIVE NEGATIVE mg/dL   Protein, ur NEGATIVE NEGATIVE mg/dL   Nitrite NEGATIVE NEGATIVE   Leukocytes,Ua NEGATIVE NEGATIVE    Comment: Performed at [facility], [address] [place], [place] [address]  Urine culture     Status: None   Collection Time: 11/10/19  1:06 AM   Specimen: In/Out Cath Urine  Result Value Ref Range   Specimen Description      IN/OUT CATH URINE Performed at [facility], [address] [place], [place] [address]    Special Requests      NONE Performed at [facility], [address], [place], [place] [address]    Culture      NO GROWTH Performed at [facility], [place] [address] [place], [place] [address]    Report Status 11/11/2019 FINAL   Lactic acid, plasma     Status: None   Collection Time: 11/10/19  1:28 AM  Result Value Ref Range   Lactic Acid, Venous 1.6 0.5 - 1.9 mmol/L    Comment: Performed at [facility], [address],  [place], [place] [address]  Troponin I (High Sensitivity)     Status: None   Collection Time: 11/10/19  1:28 AM  Result Value Ref Range   Troponin I (High Sensitivity) 7 <18 ng/L    Comment: (NOTE) Elevated high sensitivity troponin I (hsTnI) values and significant  changes across serial measurements may suggest ACS but many other  chronic and acute conditions are known to elevate hsTnI results.  Refer to the "Links" section for chest pain algorithms and additional  guidance. Performed at [facility], [address] [place], [place] [address]   SARS CORONAVIRUS 2 (TAT 6-24 HRS) Nasopharyngeal Nasopharyngeal Swab     Status: None  Collection Time: 11/10/19  2:33 AM   Specimen: Nasopharyngeal Swab  Result Value Ref Range   SARS Coronavirus 2 NEGATIVE NEGATIVE    Comment: (NOTE) SARS-CoV-2 target nucleic acids are NOT DETECTED. The SARS-CoV-2 RNA is generally detectable in upper and lower respiratory specimens during the acute phase of infection. Negative results do not preclude SARS-CoV-2 infection, do not rule out co-infections with other pathogens, and should not be used as the sole basis for treatment or other patient management decisions. Negative results must be combined with clinical observations, patient history, and epidemiological information. The expected result is Negative. Fact Sheet for Patients: SugarRoll.be Fact Sheet for Healthcare Providers: https://www.woods-mathews.com/ This test is not yet approved or cleared by the [place] FDA and  has been authorized for detection and/or diagnosis of SARS-CoV-2 by FDA under an Emergency Use Authorization (EUA). This EUA will remain  in effect (meaning this test can be used) for the duration of the COVID-19 declaration under Section 56 4(b)(1) of the Act, 21 U.S.C. section 360bbb-3(b)(1), unless the authorization is terminated or revoked sooner. Performed at [facility],  [place] [address] [place], [place] [address]   CBG monitoring, ED     Status: None   Collection Time: 11/10/19  4:08 AM  Result Value Ref Range   Glucose-Capillary 92 70 - 99 mg/dL  Basic metabolic panel     Status: Abnormal   Collection Time: 11/10/19  4:38 AM  Result Value Ref Range   Sodium 134 (L) 135 - 145 mmol/L    Comment: DELTA CHECK NOTED   Potassium 4.7 3.5 - 5.1 mmol/L   Chloride 97 (L) 98 - 111 mmol/L   CO2 28 22 - 32 mmol/L   Glucose, Bld 101 (H) 70 - 99 mg/dL   BUN 28 (H) 8 - 23 mg/dL   Creatinine, Ser 1.16 0.61 - 1.24 mg/dL   Calcium 9.1 8.9 - 10.3 mg/dL   GFR calc non Af Amer 59 (L) >60 mL/min   GFR calc Af Amer >60 >60 mL/min   Anion gap 9 5 - 15    Comment: Performed at [facility], [address] [place], [place] [address]  Hemoglobin A1c     Status: Abnormal   Collection Time: 11/10/19  4:38 AM  Result Value Ref Range   Hgb A1c MFr Bld 6.9 (H) 4.8 - 5.6 %    Comment: (NOTE) Pre diabetes:          5.7%-6.4% Diabetes:              >6.4% Glycemic control for   <7.0% adults with diabetes    Mean Plasma Glucose 151.33 mg/dL    Comment: Performed at [facility] [address], [place], [place] [address]  Protime-INR     Status: Abnormal   Collection Time: 11/10/19  4:38 AM  Result Value Ref Range   Prothrombin Time 33.7 (H) 11.4 - 15.2 seconds   INR 3.3 (H) 0.8 - 1.2    Comment: (NOTE) INR goal varies based on device and disease states. Performed at [facility], [address], [place],  [address]   Cortisol     Status: None   Collection Time: 11/10/19  4:38 AM  Result Value Ref Range   Cortisol, Plasma 14.1 ug/dL    Comment: (NOTE) AM    6.7 - 22.6 ug/dL PM   <10.0       ug/dL Performed at [place],  [place] [address]   CBG monitoring, ED     Status: Abnormal   Collection Time: 11/10/19  8:38 AM  Result Value Ref Range   Glucose-Capillary 126 (H) 70 - 99 mg/dL  CBG monitoring, ED     Status: Abnormal    Collection Time: 11/10/19 12:26 PM  Result Value Ref Range   Glucose-Capillary 268 (H) 70 - 99 mg/dL  Glucose, capillary     Status: Abnormal   Collection Time: 11/10/19  4:22 PM  Result Value Ref Range   Glucose-Capillary 280 (H) 70 - 99 mg/dL  Glucose, capillary     Status: Abnormal   Collection Time: 11/10/19  8:47 PM  Result Value Ref Range   Glucose-Capillary 225 (H) 70 - 99 mg/dL  CBC WITH DIFFERENTIAL     Status: Abnormal   Collection Time: 11/11/19  6:25 AM  Result Value Ref Range   WBC 10.3 4.0 - 10.5 K/uL   RBC 3.12 (L) 4.22 - 5.81 MIL/uL   Hemoglobin 9.2 (L) 13.0 - 17.0 g/dL   HCT 30.2 (L) 39.0 - 52.0 %   MCV 96.8 80.0 - 100.0 fL   MCH 29.5 26.0 - 34.0 pg   MCHC 30.5 30.0 - 36.0 g/dL   RDW 13.8 11.5 - 15.5 %   Platelets 258 150 - 400 K/uL   nRBC 0.0 0.0 - 0.2 %   Neutrophils Relative % 85 %   Neutro Abs 8.8 (H) 1.7 - 7.7 K/uL   Lymphocytes Relative 5 %   Lymphs Abs 0.5 (L) 0.7 - 4.0 K/uL   Monocytes Relative 9 %   Monocytes Absolute 0.9 0.1 - 1.0 K/uL   Eosinophils Relative 0 %   Eosinophils Absolute 0.0 0.0 - 0.5 K/uL   Basophils Relative 0 %   Basophils Absolute 0.0 0.0 - 0.1 K/uL   Immature Granulocytes 1 %   Abs Immature Granulocytes 0.09 (H) 0.00 - 0.07 K/uL    Comment: Performed at [facility], [address], [place], [place] [address]  Protime-INR     Status: Abnormal   Collection Time: 11/11/19  6:25 AM  Result Value Ref Range   Prothrombin Time 30.0 (H) 11.4 - 15.2 seconds   INR 2.9 (H) 0.8 - 1.2    Comment: (NOTE) INR goal varies based on device and disease states. Performed at [facility], [address] [place], [place] [address]   Comprehensive metabolic panel     Status: Abnormal   Collection Time: 11/11/19  6:25 AM  Result Value Ref Range   Sodium 132 (L) 135 - 145 mmol/L   Potassium 4.7 3.5 - 5.1 mmol/L   Chloride 98 98 - 111 mmol/L   CO2 27 22 - 32 mmol/L   Glucose, Bld 197 (H) 70 - 99 mg/dL   BUN 32 (H) 8 - 23 mg/dL    Creatinine, Ser 1.15 0.61 - 1.24 mg/dL   Calcium 8.9 8.9 - 10.3 mg/dL   Total Protein 5.7 (L) 6.5 - 8.1 g/dL   Albumin 2.3 (L) 3.5 - 5.0 g/dL   AST 10 (L) 15 - 41 U/L   ALT 13 0 - 44 U/L   Alkaline Phosphatase 56 38 - 126 U/L   Total Bilirubin 0.5 0.3 - 1.2 mg/dL   GFR calc non Af Amer 59 (L) >60 mL/min   GFR calc Af Amer >60 >60 mL/min   Anion gap 7 5 - 15    Comment: Performed at [facility], [address], [place], [place] [address]  Magnesium     Status: None   Collection Time: 11/11/19  6:25 AM  Result Value Ref Range   Magnesium 1.7 1.7 - 2.4 mg/dL    Comment: Performed at [facility], [address], [place], [place] [address]  Glucose, capillary     Status: Abnormal   Collection Time: 11/11/19  7:14 AM  Result Value Ref Range   Glucose-Capillary 173 (H) 70 - 99 mg/dL  Glucose, capillary     Status: Abnormal   Collection Time: 11/11/19 11:12 AM  Result Value Ref Range   Glucose-Capillary 228 (H) 70 - 99 mg/dL  Glucose, capillary     Status: Abnormal   Collection Time: 11/11/19  4:18 PM  Result Value Ref Range   Glucose-Capillary 216 (H) 70 - 99 mg/dL      RADIOGRAPHY: CT Head Wo Contrast  Result Date: 11/10/2019 CLINICAL DATA:  Head trauma and headache. Fall. EXAM: CT HEAD WITHOUT CONTRAST TECHNIQUE: Contiguous axial images were obtained from the base of the skull through the vertex without intravenous contrast. COMPARISON:  None. FINDINGS: Brain: There is no mass, hemorrhage or extra-axial collection. There is generalized atrophy without lobar predilection. Hypodensity of the white matter is most commonly associated with chronic microvascular disease. Vascular: Atherosclerotic calcification of the vertebral and internal carotid arteries at the skull base. No abnormal hyperdensity of the major intracranial arteries or dural venous sinuses. Skull: The visualized skull base, calvarium and extracranial soft tissues are normal. Sinuses/Orbits: No fluid levels or advanced mucosal  thickening of the visualized paranasal sinuses. No mastoid or middle ear effusion. The orbits are normal. IMPRESSION: Chronic small vessel disease and generalized volume loss without acute intracranial abnormality. Electronically Signed   By: Ulyses Jarred M.D.   On: 11/10/2019 00:43   DG Chest Port 1 View  Result Date: 11/10/2019 CLINICAL DATA:  Weakness. Fall yesterday. EXAM: PORTABLE CHEST 1 VIEW COMPARISON:  None. FINDINGS: Cardiomegaly. Aortic atherosclerosis. Streaky left lower lobe atelectasis. No confluent airspace disease, pulmonary edema, large pleural effusion or pneumothorax. No acute osseous abnormalities. Advanced chronic degenerative changes of both shoulders. IMPRESSION: Cardiomegaly with left lower lobe atelectasis. Aortic Atherosclerosis (ICD10-I70.0). Electronically Signed   By: Keith Rake M.D.   On: 11/10/2019 00:36       ASSESSMENT and PLAN:  1.  Metastatic squamous cell carcinoma: -He has moderate differentiated squamous cell carcinoma arising in the right thumb sometime in 2017, status post local resection and radiation.  Required multiple resections including right parotidectomy in September 2017 followed by partial course of radiation.  He developed right axillary mass in August 2020, biopsy consistent with squamous cell carcinoma. -CT scan of the chest on 07/02/2019 done in [place] showed 5.4 cm right axillary soft tissue mass consistent with lymph node. -Cycle 1 of Cemiplimab on 08/25/2019 -Cycle 2 of Cemiplimab on 09/14/2019, -Cycle 3 Cemiplimab on 10/12/2019.  He also received Zometa as his calcium was mildly elevated during cycle 3. -Physical exam today shows fungating exophytic mass in the right axillary region. -I have recommended restaging CT chest, abdomen and pelvis to evaluate response as the patient requests prognosis of his malignancy.  I have conveyed this information to Dr. Wynetta Emery.  2.  History of hypercalcemia: -He received Zometa on 10/12/2019.   Calcium is normal today.  3.  Normocytic anemia: -Hemoglobin is 9.2.  Likely anemia of chronic inflammation and CKD.  4.  Atrial fibrillation: -INR today is 2.9.  He is on Coumadin.  All questions were answered. The patient knows to  call the clinic with any problems, questions or concerns. We can certainly see the patient much sooner if necessary.    Derek Jack

## 2019-11-11 NOTE — Consult Note (Signed)
[facility] Consult  Reason for Consult: Right axillary necrotic open metastatic squamous cell cancer  Referring Physician:  Dr. Wynetta Emery   Chief Complaint    Fall      HPI: Charles Chambers is a 84 y.o. 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  The patient has been seen by Dr. Arnoldo Morale in the past for the axillary mass back in September and a [place] FNA was performed that demonstrated the metastatic disease.  Since that time the mass has continued to grow and has opened up and become more necrotic and cavitating as well as fungating. Per the daughter, Charles Chambers, Dr. Benay Spice says the immunotherapy can make the cancer grow initially.  The wound per Charles Chambers has been getting larger and draining more.  She had been doing Silvadene cream for a while.    Past Medical History:  Diagnosis Date  . Abnormal gait   . Atrial fibrillation ([place])   . CAD (coronary artery disease)   . 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 Chronic back pain   . Complication of anesthesia    supraclavicular block caused trouble breathing and heartrate dropped  . Congestive heart failure with left ventricular diastolic dysfunction, NYHA class 2 ([place])   . Diabetes mellitus    INSULIN DEPENDENT, CONTROLLED Type 2  . Diabetic neuropathy ([place])   . Diabetic neuropathy ([place])   . Diverticulosis of colon   . Hx of colonoscopy    10 YEARS AGO  . Hx of nonmelanoma skin cancer   . Hx of osteomyelitis   . Hyperlipidemia   . Hypertension    BENIGN ESSENTIAL  . Lower extremity venous stasis   . MI (myocardial infarction) ([place]) 09/06/1995  . Mild renal insufficiency    decreased kidney function  . Moderate mitral regurgitation   . Morbid obesity with BMI of 40.0-44.9,  adult ([place])   . Obstructive sleep apnea on CPAP    does not wear CPAP, wears 2L O2 [place]  . Osteoarthritis, multiple sites   . Osteomyelitis of vertebra of lumbar region ([place])   . Pneumonia   . Status post coronary artery stent placement 09/09/1995  . Vitamin D deficiency     Past Surgical History:  Procedure Laterality Date  . AMPUTATION Right 06/05/2018   Procedure: RIGHT THUMB REVISION AMPUTATION;  Surgeon: Charlotte Crumb, MD;  Location: [place];  Service: Orthopedics;  Laterality: Right;  . AMPUTATION Right 03/20/2019   Procedure: Right thumb amputation with neurolysis and rotation flap coverage as necessary;  Surgeon: Roseanne Kaufman, MD;  Location: [place];  Service: Orthopedics;  Laterality: Right;  60 mins  . ANGIOPLASTY  08/1995   STENT PLACEMENT  . ARM/ELBOW SURGERY  2016  . BACK SURGERY    . CORONARY STENT PLACEMENT  09/09/1995  . I & D EXTREMITY Right 03/23/2018   Procedure: IRRIGATION AND DEBRIDEMENT WITH INTERPHANGEAL JOINT [place] ROTATION FLAP;  Surgeon: Charlotte Crumb, MD;  Location: [place];  Service: Orthopedics;  Laterality: Right;  . LAMINECTOMY     OPEN BIOPSY OF L4-S1  . PAROTIDECTOMY Right 06/2016  . POSTERIOR FUSION FOR OSTEOMYLITIS  11/2008  . REMOVAL OF CYST FROM EYELID  10/2010    Family History  Problem Relation Age of  Onset  . Heart disease Mother 69  . Heart disease Father     Social History   Tobacco Use  . Smoking status: Former Smoker    Types: Cigarettes  . Smokeless tobacco: Never Used  . Tobacco comment:  " stopped smoking cigarettes in 1970 "  Substance Use Topics  . Alcohol use: No  . Drug use: No    Medications:  I have reviewed the patient's current medications. Prior to Admission:  Medications Prior to Admission  Medication Sig Dispense Refill Last Dose  . acetaminophen (TYLENOL) 500 MG tablet Take 1,000 mg by mouth 2 (two) times daily as needed for moderate pain.      Marland Kitchen aspirin EC 81 MG tablet Take 81 mg by mouth daily.        Marland Kitchen atorvastatin (LIPITOR) 20 MG tablet Take 20 mg by mouth at bedtime.    11/09/2019 at Unknown time  . carvedilol (COREG) 25 MG tablet Take 25 mg by mouth 2 (two) times daily with a meal.    11/09/2019 at Unknown time  . Cholecalciferol (VITAMIN D3) 3000 UNITS TABS Take 3,000 Units by mouth daily.    11/09/2019 at Unknown time  . furosemide (LASIX) 20 MG tablet Take 20 mg by mouth every Monday, Wednesday, and Friday.   Past Week at Unknown time  . hydrochlorothiazide (HYDRODIURIL) 25 MG tablet Take 25 mg by mouth daily.     11/09/2019 at Unknown time  . insulin lispro protamine-insulin lispro (HUMALOG 75/25) (75-25) 100 UNIT/ML SUSP Inject 17-34 Units into the skin See admin instructions. Inject 34 units SQ in the morning and inject 17 units SQ in the evening   11/09/2019 at Unknown time  . lisinopril (PRINIVIL,ZESTRIL) 20 MG tablet Take 20 mg by mouth daily.   11/09/2019 at Unknown time  . nitroGLYCERIN (NITROSTAT) 0.4 MG SL tablet Place 0.4 mg under the tongue every 5 (five) minutes as needed for chest pain.   0   . OXYGEN Inhale 2 L into the lungs See admin instructions. Use 2 L at bedtime and as needed for shortness of breath     . traMADol (ULTRAM) 50 MG tablet Take 1 tablet (50 mg total) by mouth every 6 (six) hours as needed for moderate pain. 40 tablet 0   . warfarin (COUMADIN) 5 MG tablet Take 2.5-5 mg by mouth See admin instructions. Take 5 mg in the evening Tue, Thur, and Sun after evening meals. Take 2.5 mg in the evening on Mon, Wed, Fri, and Sat     . silver sulfADIAZINE (SILVADENE) 1 % cream Apply to affected area twice a  day (Patient not taking: Reported on 11/10/2019) 50 g 2 Not Taking at Unknown time   Scheduled: . atorvastatin  20 mg Oral QHS  . carvedilol  12.5 mg Oral BID WC  . cholecalciferol  3,000 Units Oral Daily  . insulin aspart  0-5 Units Subcutaneous QHS  . insulin aspart  0-9 Units Subcutaneous TID WC  . insulin aspart  5 Units Subcutaneous TID WC  . insulin glargine   18 Units Subcutaneous Daily  . nystatin   Topical BID  . silver sulfADIAZINE   Topical BID  . sodium chloride flush  3 mL Intravenous Q12H  . warfarin  2.5 mg Oral ONCE-1800  . Warfarin - Pharmacist Dosing Inpatient   Does not apply q1800   Continuous: . cefTRIAXone (ROCEPHIN)  IV 200 mL/hr at 11/11/19 0400   [id number] **OR** acetaminophen, HYDROcodone-acetaminophen, nitroGLYCERIN, ondansetron **OR** ondansetron (  ZOFRAN) IV, senna-docusate, traMADol  Allergies  Allergen Reactions  . Naproxen Sodium Other (See Comments) and Nausea And Vomiting    Due to kidney function  . Contrast Media [Iodinated Diagnostic Agents]     : No contrast media dye due to fluctuations with renal function."  . Nsaids     Poor kidney function   . Other Other (See Comments)    Narcotics cause depression, especially codeine Contrast products can not be used due to kidney function   . Baclofen Other (See Comments)    HALLUCINATIONS    . Codeine     depression  . Mushroom Extract Complex Nausea And Vomiting  . Neurontin [Gabapentin] Other (See Comments)    overstimulation     ROS:  A comprehensive review of systems was negative except for: Hematologic/lymphatic: positive for right axillary wound with drainage  Blood pressure (!) 123/94, pulse (!) 110, temperature 97.6 F (36.4 C), temperature source Oral, resp. rate 20, height 5\' 9"  (1.753 m), weight 118.8 kg, SpO2 100 %. Physical Exam Vitals reviewed.  Constitutional:      General: He is not in acute distress. HENT:     Head: Normocephalic.     Comments: S/p right parotidectomy     Mouth/Throat:     Mouth: Mucous membranes are moist.  Eyes:     Pupils: Pupils are equal, round, and reactive to light.  Cardiovascular:     Rate and Rhythm: Normal rate.  Pulmonary:     Effort: Pulmonary effort is normal.  Abdominal:     General: There is no distension.     Palpations: Abdomen is soft.     Tenderness: There is no abdominal  tenderness.  Lymphadenopathy:     Comments: Right axilla with cavitating and fungating mass, measuring 15cm long X5cm wide X5cm deep with secondary lesion about 2cm in size superior, indurated from mass extending under open cavitation ; right thumb amputation with nodular appearance at incision site consistent with recurrence squamous cell disease, drainage from the axilla, no erythema of skin   Skin:    General: Skin is warm.  Neurological:     General: No focal deficit present.     Mental Status: He is alert and oriented to person, place, and time.  Psychiatric:        Mood and Affect: Mood normal.        Behavior: Behavior normal.        Thought Content: Thought content normal.        Judgment: Judgment normal.           Results: Results for orders placed or performed during the hospital encounter of 11/09/19 (from the past 48 hour(s))  Lactic acid, plasma     Status: Abnormal   Collection Time: 11/09/19 11:35 PM  Result Value Ref Range   Lactic Acid, Venous 2.2 (HH) 0.5 - 1.9 mmol/L    Comment: CRITICAL RESULT CALLED TO, READ BACK BY AND VERIFIED WITH: T TALBOT,RN @0105  11/10/19 MKELLY Performed at [facility], [address], [place], [place] [address]   Comprehensive metabolic panel     Status: Abnormal   Collection Time: 11/09/19 11:35 PM  Result Value Ref Range   Sodium 127 (L) 135 - 145 mmol/L   Potassium 4.3 3.5 - 5.1 mmol/L   Chloride 92 (L) 98 - 111 mmol/L   CO2 26 22 - 32 mmol/L   Glucose, Bld 118 (H) 70 - 99 mg/dL   BUN 31 (H) 8 - 23  mg/dL   Creatinine, Ser 1.23 0.61 - 1.24 mg/dL   Calcium 9.3 8.9 - 10.3 mg/dL   Total Protein 6.8 6.5 - 8.1 g/dL   Albumin 2.8 (L) 3.5 - 5.0 g/dL   AST 15 15 - 41 U/L   ALT 14 0 - 44 U/L   Alkaline Phosphatase 75 38 - 126 U/L   Total Bilirubin 1.1 0.3 - 1.2 mg/dL   GFR calc non Af Amer 55 (L) >60 mL/min   GFR calc Af Amer >60 >60 mL/min   Anion gap 9 5 - 15    Comment: Performed at [facility], [address]  [place], [place] [address]  CBC WITH DIFFERENTIAL     Status: Abnormal   Collection Time: 11/09/19 11:35 PM  Result Value Ref Range   WBC 14.5 (H) 4.0 - 10.5 K/uL   RBC 3.49 (L) 4.22 - 5.81 MIL/uL   Hemoglobin 10.6 (L) 13.0 - 17.0 g/dL   HCT 33.7 (L) 39.0 - 52.0 %   MCV 96.6 80.0 - 100.0 fL   MCH 30.4 26.0 - 34.0 pg   MCHC 31.5 30.0 - 36.0 g/dL   RDW 13.7 11.5 - 15.5 %   Platelets 320 150 - 400 K/uL   nRBC 0.0 0.0 - 0.2 %   Neutrophils Relative % 86 %   Neutro Abs 12.4 (H) 1.7 - 7.7 K/uL   Lymphocytes Relative 4 %   Lymphs Abs 0.6 (L) 0.7 - 4.0 K/uL   Monocytes Relative 9 %   Monocytes Absolute 1.4 (H) 0.1 - 1.0 K/uL   Eosinophils Relative 0 %   Eosinophils Absolute 0.0 0.0 - 0.5 K/uL   Basophils Relative 0 %   Basophils Absolute 0.0 0.0 - 0.1 K/uL   Immature Granulocytes 1 %   Abs Immature Granulocytes 0.10 (H) 0.00 - 0.07 K/uL    Comment: Performed at [facility], [address], [place], [place] [address]  APTT     Status: Abnormal   Collection Time: 11/09/19 11:35 PM  Result Value Ref Range   aPTT 63 (H) 24 - 36 seconds    Comment:        IF BASELINE aPTT IS ELEVATED, SUGGEST PATIENT RISK ASSESSMENT BE USED TO DETERMINE APPROPRIATE ANTICOAGULANT THERAPY. Performed at [facility], [address], [place], [place] [address]   Protime-INR     Status: Abnormal   Collection Time: 11/09/19 11:35 PM  Result Value Ref Range   Prothrombin Time 33.6 (H) 11.4 - 15.2 seconds   INR 3.3 (H) 0.8 - 1.2    Comment: (NOTE) INR goal varies based on device and disease states. Performed at [facility], [address] [place], [place] [address]   Blood Culture (routine x 2)     Status: None (Preliminary result)   Collection Time: 11/09/19 11:35 PM   Specimen: BLOOD  Result Value Ref Range   Specimen Description BLOOD LEFT ANTECUBITAL    Special Requests      BOTTLES DRAWN AEROBIC AND ANAEROBIC Blood Culture adequate volume   Culture      NO GROWTH 1 DAY Performed at [facility], [address], [place], [place] [address]    Report Status PENDING   CK     Status: None   Collection Time: 11/09/19 11:35 PM  Result Value Ref Range   Total CK 111 49 - 397 U/L    Comment: Performed at [facility], [address], [place], [place] [address]  Troponin I (High Sensitivity)  Status: None   Collection Time: 11/09/19 11:35 PM  Result Value Ref Range   Troponin I (High Sensitivity) 8 <18 ng/L    Comment: (NOTE) Elevated high sensitivity troponin I (hsTnI) values and significant  changes across serial measurements may suggest ACS but many other  chronic and acute conditions are known to elevate hsTnI results.  Refer to the "Links" section for chest pain algorithms and additional  guidance. Performed at [facility], [address], [place], [place] [address]   Brain natriuretic peptide     Status: Abnormal   Collection Time: 11/09/19 11:37 PM  Result Value Ref Range   B Natriuretic Peptide 177.0 (H) 0.0 - 100.0 pg/mL    Comment: Performed at [facility], [address] [place], [place] [address]  Ammonia     Status: None   Collection Time: 11/09/19 11:37 PM  Result Value Ref Range   Ammonia 14 9 - 35 umol/L    Comment: Performed at [facility], [address] [place], [place] [address]  Blood Culture (routine x 2)     Status: None (Preliminary result)   Collection Time: 11/10/19 12:42 AM   Specimen: BLOOD  Result Value Ref Range   Specimen Description BLOOD LEFT ANTECUBITAL    Special Requests      BOTTLES DRAWN AEROBIC AND ANAEROBIC Blood Culture adequate volume   Culture      NO GROWTH 1 DAY Performed at [facility], [address], [place], [place] [address]    Report Status PENDING   Urinalysis, Routine w reflex microscopic     Status: None   Collection Time: 11/10/19  1:06 AM  Result Value Ref Range   Color, Urine YELLOW YELLOW   APPearance CLEAR CLEAR   Specific Gravity, Urine 1.013 1.005 - 1.030   pH 5.0 5.0 - 8.0   Glucose, UA NEGATIVE  NEGATIVE mg/dL   Hgb urine dipstick NEGATIVE NEGATIVE   Bilirubin Urine NEGATIVE NEGATIVE   Ketones, ur NEGATIVE NEGATIVE mg/dL   Protein, ur NEGATIVE NEGATIVE mg/dL   Nitrite NEGATIVE NEGATIVE   Leukocytes,Ua NEGATIVE NEGATIVE    Comment: Performed at [facility], [address], [place], [place] [address]  Urine culture     Status: None   Collection Time: 11/10/19  1:06 AM   Specimen: In/Out Cath Urine  Result Value Ref Range   Specimen Description      IN/OUT CATH URINE Performed at [facility], [address], [place], [place] [address]    Special Requests      NONE Performed at [facility], [address], [place], [place] [address]    Culture      NO GROWTH Performed at [facility], [place] [address], [place], [place] [address]    Report Status 11/11/2019 FINAL   Lactic acid, plasma     Status: None   Collection Time: 11/10/19  1:28 AM  Result Value Ref Range   Lactic Acid, Venous 1.6 0.5 - 1.9 mmol/L    Comment: Performed at [facility], [address] [place], [place] [address]  Troponin I (High Sensitivity)     Status: None   Collection Time: 11/10/19  1:28 AM  Result Value Ref Range   Troponin I (High Sensitivity) 7 <18 ng/L    Comment: (NOTE) Elevated high sensitivity troponin I (hsTnI) values and significant  changes across serial measurements may suggest ACS but many other  chronic and acute conditions are known to elevate hsTnI results.  Refer to the "  Links" section for chest pain algorithms and additional  guidance. Performed at [facility], [address], [place], [place] [address]   SARS CORONAVIRUS 2 (TAT 6-24 HRS) Nasopharyngeal Nasopharyngeal Swab     Status: None   Collection Time: 11/10/19  2:33 AM   Specimen: Nasopharyngeal Swab  Result Value Ref Range   SARS Coronavirus 2 NEGATIVE NEGATIVE    Comment: (NOTE) SARS-CoV-2 target nucleic acids are NOT DETECTED. The SARS-CoV-2 RNA is generally detectable in upper and lower respiratory  specimens during the acute phase of infection. Negative results do not preclude SARS-CoV-2 infection, do not rule out co-infections with other pathogens, and should not be used as the sole basis for treatment or other patient management decisions. Negative results must be combined with clinical observations, patient history, and epidemiological information. The expected result is Negative. Fact Sheet for Patients: SugarRoll.be Fact Sheet for Healthcare Providers: https://www.woods-mathews.com/ This test is not yet approved or cleared by the [place] FDA and  has been authorized for detection and/or diagnosis of SARS-CoV-2 by FDA under an Emergency Use Authorization (EUA). This EUA will remain  in effect (meaning this test can be used) for the duration of the COVID-19 declaration under Section 56 4(b)(1) of the Act, 21 U.S.C. section 360bbb-3(b)(1), unless the authorization is terminated or revoked sooner. Performed at [facility], [place] [address] [place], [place] [address]   CBG monitoring, ED     Status: None   Collection Time: 11/10/19  4:08 AM  Result Value Ref Range   Glucose-Capillary 92 70 - 99 mg/dL  Basic metabolic panel     Status: Abnormal   Collection Time: 11/10/19  4:38 AM  Result Value Ref Range   Sodium 134 (L) 135 - 145 mmol/L    Comment: DELTA CHECK NOTED   Potassium 4.7 3.5 - 5.1 mmol/L   Chloride 97 (L) 98 - 111 mmol/L   CO2 28 22 - 32 mmol/L   Glucose, Bld 101 (H) 70 - 99 mg/dL   BUN 28 (H) 8 - 23 mg/dL   Creatinine, Ser 1.16 0.61 - 1.24 mg/dL   Calcium 9.1 8.9 - 10.3 mg/dL   GFR calc non Af Amer 59 (L) >60 mL/min   GFR calc Af Amer >60 >60 mL/min   Anion gap 9 5 - 15    Comment: Performed at [facility], [address], [place], [place] [address]  Hemoglobin A1c     Status: Abnormal   Collection Time: 11/10/19  4:38 AM  Result Value Ref Range   Hgb A1c MFr Bld 6.9 (H) 4.8 - 5.6 %    Comment:  (NOTE) Pre diabetes:          5.7%-6.4% Diabetes:              >6.4% Glycemic control for   <7.0% adults with diabetes    Mean Plasma Glucose 151.33 mg/dL    Comment: Performed at [place] [address], [place], [place] [address]  Protime-INR     Status: Abnormal   Collection Time: 11/10/19  4:38 AM  Result Value Ref Range   Prothrombin Time 33.7 (H) 11.4 - 15.2 seconds   INR 3.3 (H) 0.8 - 1.2    Comment: (NOTE) INR goal varies based on device and disease states. Performed at [facility], [address] [place], [place] [address]   Cortisol     Status: None   Collection Time: 11/10/19  4:38 AM  Result Value Ref Range  Cortisol, Plasma 14.1 ug/dL    Comment: (NOTE) AM    6.7 - 22.6 ug/dL PM   <10.0       ug/dL Performed at [facility], [place] [address] [place], [place] [address]   CBG monitoring, ED     Status: Abnormal   Collection Time: 11/10/19  8:38 AM  Result Value Ref Range   Glucose-Capillary 126 (H) 70 - 99 mg/dL  CBG monitoring, ED     Status: Abnormal   Collection Time: 11/10/19 12:26 PM  Result Value Ref Range   Glucose-Capillary 268 (H) 70 - 99 mg/dL  Glucose, capillary     Status: Abnormal   Collection Time: 11/10/19  4:22 PM  Result Value Ref Range   Glucose-Capillary 280 (H) 70 - 99 mg/dL  Glucose, capillary     Status: Abnormal   Collection Time: 11/10/19  8:47 PM  Result Value Ref Range   Glucose-Capillary 225 (H) 70 - 99 mg/dL  CBC WITH DIFFERENTIAL     Status: Abnormal   Collection Time: 11/11/19  6:25 AM  Result Value Ref Range   WBC 10.3 4.0 - 10.5 K/uL   RBC 3.12 (L) 4.22 - 5.81 MIL/uL   Hemoglobin 9.2 (L) 13.0 - 17.0 g/dL   HCT 30.2 (L) 39.0 - 52.0 %   MCV 96.8 80.0 - 100.0 fL   MCH 29.5 26.0 - 34.0 pg   MCHC 30.5 30.0 - 36.0 g/dL   RDW 13.8 11.5 - 15.5 %   Platelets 258 150 - 400 K/uL   nRBC 0.0 0.0 - 0.2 %   Neutrophils Relative % 85 %   Neutro Abs 8.8 (H) 1.7 - 7.7 K/uL   Lymphocytes Relative 5 %   Lymphs Abs 0.5  (L) 0.7 - 4.0 K/uL   Monocytes Relative 9 %   Monocytes Absolute 0.9 0.1 - 1.0 K/uL   Eosinophils Relative 0 %   Eosinophils Absolute 0.0 0.0 - 0.5 K/uL   Basophils Relative 0 %   Basophils Absolute 0.0 0.0 - 0.1 K/uL   Immature Granulocytes 1 %   Abs Immature Granulocytes 0.09 (H) 0.00 - 0.07 K/uL    Comment: Performed at [facility], [address] [place], [place] [address]  Protime-INR     Status: Abnormal   Collection Time: 11/11/19  6:25 AM  Result Value Ref Range   Prothrombin Time 30.0 (H) 11.4 - 15.2 seconds   INR 2.9 (H) 0.8 - 1.2    Comment: (NOTE) INR goal varies based on device and disease states. Performed at [facility], [address] [place], [place] [address]   Comprehensive metabolic panel     Status: Abnormal   Collection Time: 11/11/19  6:25 AM  Result Value Ref Range   Sodium 132 (L) 135 - 145 mmol/L   Potassium 4.7 3.5 - 5.1 mmol/L   Chloride 98 98 - 111 mmol/L   CO2 27 22 - 32 mmol/L   Glucose, Bld 197 (H) 70 - 99 mg/dL   BUN 32 (H) 8 - 23 mg/dL   Creatinine, Ser 1.15 0.61 - 1.24 mg/dL   Calcium 8.9 8.9 - 10.3 mg/dL   Total Protein 5.7 (L) 6.5 - 8.1 g/dL   Albumin 2.3 (L) 3.5 - 5.0 g/dL   AST 10 (L) 15 - 41 U/L   ALT 13 0 - 44 U/L   Alkaline Phosphatase 56 38 - 126 U/L   Total Bilirubin 0.5 0.3 - 1.2 mg/dL   GFR calc non  Af Amer 59 (L) >60 mL/min   GFR calc Af Amer >60 >60 mL/min   Anion gap 7 5 - 15    Comment: Performed at [facility], [address] [place], [place] [address]  Magnesium     Status: None   Collection Time: 11/11/19  6:25 AM  Result Value Ref Range   Magnesium 1.7 1.7 - 2.4 mg/dL    Comment: Performed at [facility], [address] [place], [place] [address]  Glucose, capillary     Status: Abnormal   Collection Time: 11/11/19  7:14 AM  Result Value Ref Range   Glucose-Capillary 173 (H) 70 - 99 mg/dL  Glucose, capillary     Status: Abnormal   Collection Time: 11/11/19 11:12 AM  Result Value Ref Range   Glucose-Capillary  228 (H) 70 - 99 mg/dL    CT Head Wo Contrast  Result Date: 11/10/2019 CLINICAL DATA:  Head trauma and headache. Fall. EXAM: CT HEAD WITHOUT CONTRAST TECHNIQUE: Contiguous axial images were obtained from the base of the skull through the vertex without intravenous contrast. COMPARISON:  None. FINDINGS: Brain: There is no mass, hemorrhage or extra-axial collection. There is generalized atrophy without lobar predilection. Hypodensity of the white matter is most commonly associated with chronic microvascular disease. Vascular: Atherosclerotic calcification of the vertebral and internal carotid arteries at the skull base. No abnormal hyperdensity of the major intracranial arteries or dural venous sinuses. Skull: The visualized skull base, calvarium and extracranial soft tissues are normal. Sinuses/Orbits: No fluid levels or advanced mucosal thickening of the visualized paranasal sinuses. No mastoid or middle ear effusion. The orbits are normal. IMPRESSION: Chronic small vessel disease and generalized volume loss without acute intracranial abnormality. Electronically Signed   By: Ulyses Jarred M.D.   On: 11/10/2019 00:43   DG Chest Port 1 View  Result Date: 11/10/2019 CLINICAL DATA:  Weakness. Fall yesterday. EXAM: PORTABLE CHEST 1 VIEW COMPARISON:  None. FINDINGS: Cardiomegaly. Aortic atherosclerosis. Streaky left lower lobe atelectasis. No confluent airspace disease, pulmonary edema, large pleural effusion or pneumothorax. No acute osseous abnormalities. Advanced chronic degenerative changes of both shoulders. IMPRESSION: Cardiomegaly with left lower lobe atelectasis. Aortic Atherosclerosis (ICD10-I70.0). Electronically Signed   By: Keith Rake M.D.   On: 11/10/2019 00:36     Assessment & Plan:  Aiven Schwieterman is a 84 y.o. male with metastatic squamous cell cancer to the right axillary wound with necrotic cavitating and fungating wound.  This appears to be growing and I am not sure if he is truly  responding to the immunotherapy or not.  He has had radiation to the thumb but not the axilla.  The wound is draining but not Chambers infected at this time.   -Discussed extensively with the daughter, Charles Chambers. Operating on this wound would create a large and more difficult wound that will be more painful and require more dressing changes.  I would not recommend that here and it is unlikely that a Surgical Oncologist at a tertiary center would recommend any operative intervention due to the extent of disease and how large the operative wound would be following removal of the metastatic mass  -? Unsure if he could get salvage palliative radiation therapy to the axilla to see if this could help with involuting the wound -Consider Consulting Oncology at [facility] to see patient to see if they think he is responding to the immunotherapy? They could also weigh in about axillary radiation for shrinking the mass and wound?  -Palliative Consultation as Charles Chambers  knows this is getting towards the end and based on my conversation with her, Mr. Ostos needs some support and a plan. Charles Chambers has tried to talk about these things with her father but he is reluctant. She needs some support in these decisions.  -Would recommend dressing changes daily to twice daily with saline dampened gauze and ABD, this is ONLY for keeping the wound clean. THIS WILL NOT HEAL THE WOUND. THIS WOUND WILL NEVER HEAL.   All questions were answered to the satisfaction of the patient and family.  Greater than 50% of the 70 minute visit was spent in seeing the patient, discussing the case with daughter and counseling/ coordination of care regarding the patient's metastatic disease to the right axilla.    Virl Cagey 11/11/2019, 12:15 PM

## 2019-11-12 ENCOUNTER — Inpatient Hospital Stay (HOSPITAL_COMMUNITY): Payer: Medicare Other

## 2019-11-12 LAB — BASIC METABOLIC PANEL
Anion gap: 8 (ref 5–15)
BUN: 32 mg/dL — ABNORMAL HIGH (ref 8–23)
CO2: 30 mmol/L (ref 22–32)
Calcium: 9.2 mg/dL (ref 8.9–10.3)
Chloride: 97 mmol/L — ABNORMAL LOW (ref 98–111)
Creatinine, Ser: 1.12 mg/dL (ref 0.61–1.24)
GFR calc Af Amer: >60 mL/min (ref >60)
GFR calc non Af Amer: >60 mL/min (ref >60)
Glucose, Bld: 206 mg/dL — ABNORMAL HIGH (ref 70–99)
Potassium: 5.1 mmol/L (ref 3.5–5.1)
Sodium: 135 mmol/L (ref 135–145)

## 2019-11-12 LAB — GLUCOSE, CAPILLARY
Glucose-Capillary: 155 mg/dL — ABNORMAL HIGH (ref 70–99)
Glucose-Capillary: 158 mg/dL — ABNORMAL HIGH (ref 70–99)
Glucose-Capillary: 174 mg/dL — ABNORMAL HIGH (ref 70–99)
Glucose-Capillary: 192 mg/dL — ABNORMAL HIGH (ref 70–99)
Glucose-Capillary: 176 mg/dL — ABNORMAL HIGH (ref 70–99)

## 2019-11-12 LAB — PROTIME-INR
INR: 2.4 — ABNORMAL HIGH (ref 0.8–1.2)
Prothrombin Time: 26.2 seconds — ABNORMAL HIGH (ref 11.4–15.2)

## 2019-11-12 MED ORDER — WARFARIN SODIUM 5 MG PO TABS
5.0000 mg | ORAL_TABLET | Freq: Once | ORAL | Status: AC
  Administered 2019-11-12: 5 mg via ORAL
  Filled 2019-11-12: qty 1

## 2019-11-12 NOTE — Progress Notes (Signed)
PROGRESS NOTE [facility]   Charles Chambers  [id number]  DOB: 09-Feb-1936  DOA: 11/09/2019 PCP: Earney Mallet, MD   Brief Admission Hx: 84 y.o. 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   MDM/Assessment & Plan:   1. Generalized weakness-PT has evaluated the patient and they have strongly recommended SNF placement.  Patient agreeable.  I asked the social workers to help with placement. 2. Hyponatremia-likely was secondary to diuretics which is currently being held.  Sodium is improving.   3. Atrial Fibrillation -his heart rate remains controlled.  He is fully anticoagulated with warfarin which we are monitoring closely. 4. Chronic diastolic CHF-he presented hypovolemic and his diuretics were held temporarily. 5. Metastatic squamous cell cancer involving the right axillary area-the mass has been treated with immunotherapy. Surgery consulted by family request for recommendations.  Unfortunately this lesion is not amenable to surgical treatment.  Oncology consulted, recommended CT CAP for staging, with findings of enlarged right axillary mass from 9/20 but no other findings of metastatic disease.  I have also asked for palliative medicine consultation but they will not be able to see until 11/15/19. 6. Squamous cell carcinoma-he completed third cycle of immunotherapy 1 month ago and was scheduled to have further immunotherapy 1/20 but missed the appointment due to being in the hospital.  DVT prophylaxis: Warfarin Code Status: Full Family Communication: Daughter Disposition Plan: Inpatient for IV antibiotics and subspecialty consultation   Consultants:  General  surgery  Palliative medicine  Oncology  Procedures:    Antimicrobials:     Subjective: Patient says he feels tired today.   Objective: Vitals:   11/11/19 2028 11/11/19 2053 11/12/19 0500 11/12/19 1333  BP: (!) 102/45  (!) 131/52   Pulse: 97  98   Resp: 18  20   Temp: 98 F (36.7 C)  98 F (36.7 C)   TempSrc:      SpO2: 100% 100% 98% 94%  Weight:      Height:        Intake/Output Summary (Last 24 hours) at 11/12/2019 1351 Last data filed at 11/12/2019 1300 Gross per 24 hour  Intake 680 ml  Output 2950 ml  Net -2270 ml   Filed Weights   11/09/19 2051 11/09/19 2052  Weight: 118.8 kg 118.8 kg   REVIEW OF SYSTEMS  As per history otherwise all reviewed and reported negative  Exam:  General exam: Chronically ill-appearing male he is lying in bed he is awake and alert and oriented x3 in no apparent distress. Respiratory system: Clear. No increased work of breathing. Cardiovascular system: S1 & S2 heard. No JVD, murmurs, gallops, clicks or pedal edema. Gastrointestinal system: Abdomen is nondistended, soft and nontender. Normal bowel sounds heard. Central nervous system: Alert and oriented. No focal neurological deficits. Extremities: Large ulcerated fungating mass under the right axillary area with serous drainage and foul smell.  Data Reviewed: Basic Metabolic Panel: Recent Labs  Lab 11/09/19 2335 11/10/19 0438 11/11/19 0625 11/12/19 0421  NA 127* 134* 132* 135  K 4.3 4.7 4.7 5.1  CL 92* 97* 98 97*  CO2 26 28 27 30   GLUCOSE 118* 101* 197* 206*  BUN 31* 28* 32* 32*  CREATININE 1.23 1.16 1.15 1.12  CALCIUM 9.3  9.1 8.9 9.2  MG  --   --  1.7  --    Liver Function Tests: Recent Labs  Lab 11/09/19 2335 11/11/19 0625  AST 15 10*  ALT 14 13  ALKPHOS 75 56  BILITOT 1.1 0.5  PROT 6.8 5.7*  ALBUMIN 2.8* 2.3*   No results for input(s): LIPASE, AMYLASE in the last 168 hours. Recent Labs  Lab 11/09/19 2337  AMMONIA 14   CBC: Recent Labs  Lab  11/09/19 2335 11/11/19 0625  WBC 14.5* 10.3  NEUTROABS 12.4* 8.8*  HGB 10.6* 9.2*  HCT 33.7* 30.2*  MCV 96.6 96.8  PLT 320 258   Cardiac Enzymes: Recent Labs  Lab 11/09/19 2335  CKTOTAL 111   CBG (last 3)  Recent Labs    11/12/19 0737 11/12/19 0819 11/12/19 1119  GLUCAP 155* 158* 174*   Recent Results (from the past 240 hour(s))  Blood Culture (routine x 2)     Status: None (Preliminary result)   Collection Time: 11/09/19 11:35 PM   Specimen: BLOOD  Result Value Ref Range Status   Specimen Description BLOOD LEFT ANTECUBITAL  Final   Special Requests   Final    BOTTLES DRAWN AEROBIC AND ANAEROBIC Blood Culture adequate volume   Culture   Final    NO GROWTH 2 DAYS Performed at [facility], [address], [place], [place] [address]    Report Status PENDING  Incomplete  Blood Culture (routine x 2)     Status: None (Preliminary result)   Collection Time: 11/10/19 12:42 AM   Specimen: BLOOD  Result Value Ref Range Status   Specimen Description BLOOD LEFT ANTECUBITAL  Final   Special Requests   Final    BOTTLES DRAWN AEROBIC AND ANAEROBIC Blood Culture adequate volume   Culture   Final    NO GROWTH 2 DAYS Performed at [facility], [address], [place], [place] [address]    Report Status PENDING  Incomplete  Urine culture     Status: None   Collection Time: 11/10/19  1:06 AM   Specimen: In/Out Cath Urine  Result Value Ref Range Status   Specimen Description   Final    IN/OUT CATH URINE Performed at [facility], [address], [place], [place] [address]    Special Requests   Final    NONE Performed at [facility], [address] [place], [place] [address]    Culture   Final    NO GROWTH Performed at [facility], [place] [address], [place], [place] [address]    Report Status 11/11/2019 FINAL  Final  SARS CORONAVIRUS 2 (TAT 6-24 HRS) Nasopharyngeal Nasopharyngeal Swab     Status: None   Collection Time: 11/10/19  2:33 AM   Specimen: Nasopharyngeal  Swab  Result Value Ref Range Status   SARS Coronavirus 2 NEGATIVE NEGATIVE Final    Comment: (NOTE) SARS-CoV-2 target nucleic acids are NOT DETECTED. The SARS-CoV-2 RNA is generally detectable in upper and lower respiratory specimens during the acute phase of infection. Negative results do not preclude SARS-CoV-2 infection, do not rule out co-infections with other pathogens, and should not be used as the sole basis for treatment or other patient management decisions. Negative results must be combined with clinical observations, patient history, and epidemiological information. The expected result is Negative. Fact Sheet for Patients: SugarRoll.be Fact Sheet for Healthcare Providers: https://www.woods-mathews.com/ This test is not yet approved or cleared by the [place] FDA and  has been authorized for detection  and/or diagnosis of SARS-CoV-2 by FDA under an Emergency Use Authorization (EUA). This EUA will remain  in effect (meaning this test can be used) for the duration of the COVID-19 declaration under Section 56 4(b)(1) of the Act, 21 U.S.C. section 360bbb-3(b)(1), unless the authorization is terminated or revoked sooner. Performed at [facility], [place] [address] [place], [place] [address]      Studies: CT ABDOMEN PELVIS WO CONTRAST  Result Date: 11/12/2019 CLINICAL DATA:  Metastatic squamous cell carcinoma involving the right axilla. Weakness, fall. EXAM: CT CHEST, ABDOMEN AND PELVIS WITHOUT CONTRAST TECHNIQUE: Multidetector CT imaging of the chest, abdomen and pelvis was performed following the standard protocol without IV contrast. COMPARISON:  Chest radiograph 11/10/2019 FINDINGS: CT CHEST FINDINGS Cardiovascular: Coronary, aortic arch, and branch vessel atherosclerotic vascular disease. Mild cardiomegaly. Mediastinum/Nodes: Right axillary mass extends to the cutaneous surface and measures 8.8 by 5.2 cm, compatible with  malignancy. Lungs/Pleura: Trace bilateral pleural effusions. Bilateral airway thickening. Bandlike in nodular density in the left upper lobe extending along the major fissure, nodular portion measures 1.1 by 0.5 cm although a benign etiology is favored given the overall configuration. Musculoskeletal: Advanced degenerative left glenohumeral arthropathy. Thoracic spondylosis. CT ABDOMEN PELVIS FINDINGS Hepatobiliary: Nodular liver favoring cirrhosis. Pancreas: Unremarkable Spleen: Unremarkable Adrenals/Urinary Tract: 2.0 by 1.5 cm left adrenal nodule with linear calcification along its margin, nonspecific density. Stomach/Bowel: Descending and sigmoid colon diverticulosis. Vascular/Lymphatic: Aortoiliac atherosclerotic vascular disease. No pathologic adenopathy in the abdomen or pelvis. Reproductive: Unremarkable Other: Presacral edema/fluid, cause uncertain. Musculoskeletal: Posterolateral rod and pedicle screw fixation at L3-L5, no pedicle screws at the L4-5 level although there is potentially inter facet fusion at L4-5. Grade 1 degenerative retrolisthesis at L1-2 and L2-3 with loss of disc height throughout the lumbar spine except at L5-S1. Spurring in the lumbar spine causes bilateral foraminal impingement at all levels, most especially at L4-5 and L5-S1 on the right. I do not see a sacral fracture or specific sacral abnormality to correlate with the presacral edema. IMPRESSION: 1. 8.8 by 5.2 cm right axillary mass extending to the cutaneous surface, compatible with malignancy. 2. No specific findings of metastatic disease to the abdomen or pelvis. 3. Other imaging findings of potential clinical significance: Airway thickening is present, suggesting bronchitis or reactive airways disease. Trace bilateral pleural effusions. Mild cardiomegaly. Nodular liver favoring cirrhosis. Descending and sigmoid colon diverticulosis. Multilevel impingement in the lumbar spine due to spurring. Advanced degenerative left  glenohumeral arthropathy. Nonspecific small left adrenal mass with some linear calcification along its margin. Aortic Atherosclerosis (ICD10-I70.0). Electronically Signed   By: Van Clines M.D.   On: 11/12/2019 09:36   CT CHEST WO CONTRAST  Result Date: 11/12/2019 CLINICAL DATA:  Metastatic squamous cell carcinoma involving the right axilla. Weakness, fall. EXAM: CT CHEST, ABDOMEN AND PELVIS WITHOUT CONTRAST TECHNIQUE: Multidetector CT imaging of the chest, abdomen and pelvis was performed following the standard protocol without IV contrast. COMPARISON:  Chest radiograph 11/10/2019 FINDINGS: CT CHEST FINDINGS Cardiovascular: Coronary, aortic arch, and branch vessel atherosclerotic vascular disease. Mild cardiomegaly. Mediastinum/Nodes: Right axillary mass extends to the cutaneous surface and measures 8.8 by 5.2 cm, compatible with malignancy. Lungs/Pleura: Trace bilateral pleural effusions. Bilateral airway thickening. Bandlike in nodular density in the left upper lobe extending along the major fissure, nodular portion measures 1.1 by 0.5 cm although a benign etiology is favored given the overall configuration. Musculoskeletal: Advanced degenerative left glenohumeral arthropathy. Thoracic spondylosis. CT ABDOMEN PELVIS FINDINGS Hepatobiliary: Nodular liver favoring cirrhosis. Pancreas: Unremarkable Spleen: Unremarkable  Adrenals/Urinary Tract: 2.0 by 1.5 cm left adrenal nodule with linear calcification along its margin, nonspecific density. Stomach/Bowel: Descending and sigmoid colon diverticulosis. Vascular/Lymphatic: Aortoiliac atherosclerotic vascular disease. No pathologic adenopathy in the abdomen or pelvis. Reproductive: Unremarkable Other: Presacral edema/fluid, cause uncertain. Musculoskeletal: Posterolateral rod and pedicle screw fixation at L3-L5, no pedicle screws at the L4-5 level although there is potentially inter facet fusion at L4-5. Grade 1 degenerative retrolisthesis at L1-2 and L2-3 with  loss of disc height throughout the lumbar spine except at L5-S1. Spurring in the lumbar spine causes bilateral foraminal impingement at all levels, most especially at L4-5 and L5-S1 on the right. I do not see a sacral fracture or specific sacral abnormality to correlate with the presacral edema. IMPRESSION: 1. 8.8 by 5.2 cm right axillary mass extending to the cutaneous surface, compatible with malignancy. 2. No specific findings of metastatic disease to the abdomen or pelvis. 3. Other imaging findings of potential clinical significance: Airway thickening is present, suggesting bronchitis or reactive airways disease. Trace bilateral pleural effusions. Mild cardiomegaly. Nodular liver favoring cirrhosis. Descending and sigmoid colon diverticulosis. Multilevel impingement in the lumbar spine due to spurring. Advanced degenerative left glenohumeral arthropathy. Nonspecific small left adrenal mass with some linear calcification along its margin. Aortic Atherosclerosis (ICD10-I70.0). Electronically Signed   By: Van Clines M.D.   On: 11/12/2019 09:36   Scheduled Meds: . atorvastatin  20 mg Oral QHS  . carvedilol  12.5 mg Oral BID WC  . cholecalciferol  3,000 Units Oral Daily  . insulin aspart  0-5 Units Subcutaneous QHS  . insulin aspart  0-9 Units Subcutaneous TID WC  . insulin aspart  7 Units Subcutaneous TID WC  . insulin glargine  18 Units Subcutaneous Daily  . nystatin   Topical BID  . silver sulfADIAZINE   Topical BID  . sodium chloride flush  3 mL Intravenous Q12H  . warfarin  5 mg Oral ONCE-1800  . Warfarin - Pharmacist Dosing Inpatient   Does not apply q1800   Continuous Infusions:   Principal Problem:   Spell of generalized weakness Active Problems:   Permanent atrial fibrillation (HCC)   Squamous cell cancer of skin of upper arm, right   CAD (coronary artery disease)   Insulin-requiring or dependent type II diabetes mellitus (HCC)   Chronic diastolic CHF (congestive heart  failure) (HCC)   Hyponatremia   Essential hypertension   Pressure injury of skin   Metastatic squamous cell carcinoma (HCC)   Open wound of right axillary region   Weakness  Time spent:   Irwin Brakeman, MD [facility] 11/12/2019, 1:51 PM    LOS: 1 day  How to contact the [facility] Attending or Consulting provider [place] or covering provider during after hours [place], for this patient?  1. Check the care team in [facility] and look for a) attending/consulting TRH provider listed and b) the [facility] team listed 2. Log into [email or web address] and use [place]'s universal password to access. If you do not have the password, please contact the hospital operator. 3. Locate the [facility] provider you are looking for under [facility] and page to a number that you can be directly reached. 4. If you still have difficulty reaching the provider, please page the [facility] (Director on Call) for the Hospitalists listed on amion for assistance.

## 2019-11-12 NOTE — TOC Progression Note (Signed)
Transition of Care [facility]) - Progression Note    Patient Details  Name: Charles Chambers MRN: [id number] Date of Birth: 1936/03/19  Transition of Care [facility]) CM/SW Contact  Leslyn Monda, Chauncey [place], RN Phone Number: 11/12/2019, 1:46 PM  Clinical Narrative:   [facility] can make a bed offer, but can not take patient until Monday. Discussed with Katharine Look, she would like to accept bed offer. She reports that patient just had a CT scan and they are waiting on results and plan as patient has met with oncologist yesterday. Palliative consult pending.     Expected Discharge Plan: [place] Barriers to Discharge: Continued Medical Work up  Expected Discharge Plan and Services Expected Discharge Plan: [place] In-house Referral: Clinical Social Work   Post Acute Care Choice: [place] Living arrangements for the past 2 months: Single Family Home                                       Social Determinants of Health (SDOH) Interventions    Readmission Risk Interventions No flowsheet data found.

## 2019-11-12 NOTE — Care Management Important Message (Signed)
Important Message  Patient Details  Name: Charles Chambers MRN: [id number] Date of Birth: Dec 06, 1935   Medicare Important Message Given:  Yes     [name] 11/12/2019, 3:06 PM

## 2019-11-12 NOTE — Progress Notes (Signed)
ANTICOAGULATION CONSULT NOTE -  Pharmacy Consult for warfarin dosing Indication: atrial fibrillation  Allergies  Allergen Reactions  . Naproxen Sodium Other (See Comments) and Nausea And Vomiting    Due to kidney function  . Contrast Media [Iodinated Diagnostic Agents]     : No contrast media dye due to fluctuations with renal function."  . Nsaids     Poor kidney function   . Other Other (See Comments)    Narcotics cause depression, especially codeine Contrast products can not be used due to kidney function   . Baclofen Other (See Comments)    HALLUCINATIONS    . Codeine     depression  . Mushroom Extract Complex Nausea And Vomiting  . Neurontin [Gabapentin] Other (See Comments)    overstimulation    Patient Measurements: Height: 5\' 9"  (175.3 cm) Weight: 262 lb (118.8 kg) IBW/kg (Calculated) : 70.7 Heparin Dosing Weight: HEPARIN DW (KG): 97.5  Vital Signs: Temp: 98 F (36.7 C) (01/22 0500) BP: 131/52 (01/22 0500) Pulse Rate: 98 (01/22 0500)  Labs: Recent Labs    11/09/19 2335 11/09/19 2335 11/10/19 0128 11/10/19 0438 11/11/19 0625 11/12/19 0421  HGB 10.6*  --   --   --  9.2*  --   HCT 33.7*  --   --   --  30.2*  --   PLT 320  --   --   --  258  --   APTT 63*  --   --   --   --   --   LABPROT 33.6*   < >  --  33.7* 30.0* 26.2*  INR 3.3*   < >  --  3.3* 2.9* 2.4*  CREATININE 1.23   < >  --  1.16 1.15 1.12  CKTOTAL 111  --   --   --   --   --   TROPONINIHS 8  --  7  --   --   --    < > = values in this interval not displayed.    Estimated Creatinine Clearance: 65.8 mL/min (by C-G formula based on SCr of 1.12 mg/dL).   Medical History: Past Medical History:  Diagnosis Date  . Abnormal gait   . Atrial fibrillation ([place])   . CAD (coronary artery disease)   . 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 Chronic back pain   . Complication of anesthesia    supraclavicular block caused trouble breathing and heartrate dropped  .  Congestive heart failure with left ventricular diastolic dysfunction, NYHA class 2 ([place])   . Diabetes mellitus    INSULIN DEPENDENT, CONTROLLED Type 2  . Diabetic neuropathy ([place])   . Diabetic neuropathy ([place])   . Diverticulosis of colon   . Hx of colonoscopy    10 YEARS AGO  . Hx of nonmelanoma skin cancer   . Hx of osteomyelitis   . Hyperlipidemia   . Hypertension    BENIGN ESSENTIAL  . Lower extremity venous stasis   . MI (myocardial infarction) ([place]) 09/06/1995  . Mild renal insufficiency    decreased kidney function  . Moderate mitral regurgitation   . Morbid obesity with BMI of 40.0-44.9, adult ([place])   . Obstructive sleep apnea on CPAP    does not wear CPAP, wears 2L O2 [place]  . Osteoarthritis, multiple sites   . Osteomyelitis of vertebra of lumbar region ([place])   . Pneumonia   . Status post coronary artery stent placement 09/09/1995  .  Vitamin D deficiency      Assessment: Pharmacy consulted to dose warfarin for this 84 yo male cancer patient with atrial fibrillation and has been on chronic anti-coagulation with warfarin for this condition.   hgb 9.2 Platelets: 258    INR: 2.9>2.4 Home dose: warfarin 5mg  on Tues/Thurs/Sun and  2.5 mg ROW   Goal of Therapy:  INR 2-3 Monitor platelets by anticoagulation protocol: Yes   Plan:  Warfarin 5 mg x 1 dose Daily INR and CBC Monitor for signs and symptoms of bleeding.  Margot Ables, PharmD Clinical Pharmacist 11/12/2019 8:31 AM

## 2019-11-13 LAB — GLUCOSE, CAPILLARY
Glucose-Capillary: 225 mg/dL — ABNORMAL HIGH (ref 70–99)
Glucose-Capillary: 184 mg/dL — ABNORMAL HIGH (ref 70–99)
Glucose-Capillary: 142 mg/dL — ABNORMAL HIGH (ref 70–99)
Glucose-Capillary: 187 mg/dL — ABNORMAL HIGH (ref 70–99)

## 2019-11-13 LAB — PROTIME-INR
INR: 2.3 — ABNORMAL HIGH (ref 0.8–1.2)
Prothrombin Time: 25 seconds — ABNORMAL HIGH (ref 11.4–15.2)

## 2019-11-13 MED ORDER — SENNOSIDES-DOCUSATE SODIUM 8.6-50 MG PO TABS
2.0000 | ORAL_TABLET | Freq: Two times a day (BID) | ORAL | Status: DC
  Administered 2019-11-13 – 2019-11-14 (×4): 2 via ORAL
  Filled 2019-11-13 (×4): qty 2

## 2019-11-13 MED ORDER — WARFARIN SODIUM 5 MG PO TABS
5.0000 mg | ORAL_TABLET | Freq: Once | ORAL | Status: AC
  Administered 2019-11-13: 5 mg via ORAL
  Filled 2019-11-13: qty 1

## 2019-11-13 MED ORDER — BISACODYL 10 MG RE SUPP
10.0000 mg | Freq: Once | RECTAL | Status: AC
  Administered 2019-11-13: 10 mg via RECTAL
  Filled 2019-11-13: qty 1

## 2019-11-13 NOTE — Progress Notes (Signed)
ANTICOAGULATION CONSULT NOTE -  Pharmacy Consult for warfarin dosing Indication: atrial fibrillation  Allergies  Allergen Reactions  . Naproxen Sodium Other (See Comments) and Nausea And Vomiting    Due to kidney function  . Contrast Media [Iodinated Diagnostic Agents]     : No contrast media dye due to fluctuations with renal function."  . Nsaids     Poor kidney function   . Other Other (See Comments)    Narcotics cause depression, especially codeine Contrast products can not be used due to kidney function   . Baclofen Other (See Comments)    HALLUCINATIONS    . Codeine     depression  . Mushroom Extract Complex Nausea And Vomiting  . Neurontin [Gabapentin] Other (See Comments)    overstimulation    Patient Measurements: Height: 5\' 9"  (175.3 cm) Weight: 262 lb (118.8 kg) IBW/kg (Calculated) : 70.7 Heparin Dosing Weight: HEPARIN DW (KG): 97.5  Vital Signs: Temp: 97.7 F (36.5 C) (01/23 0458) Temp Source: Oral (01/23 0458) BP: 121/73 (01/23 0458) Pulse Rate: 81 (01/23 0458)  Labs: Recent Labs    11/11/19 0625 11/12/19 0421 11/13/19 0635  HGB 9.2*  --   --   HCT 30.2*  --   --   PLT 258  --   --   LABPROT 30.0* 26.2* 25.0*  INR 2.9* 2.4* 2.3*  CREATININE 1.15 1.12  --     Estimated Creatinine Clearance: 65.8 mL/min (by C-G formula based on SCr of 1.12 mg/dL).   Medical History: Past Medical History:  Diagnosis Date  . Abnormal gait   . Atrial fibrillation ([place])   . CAD (coronary artery disease)   . 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 Chronic back pain   . Complication of anesthesia    supraclavicular block caused trouble breathing and heartrate dropped  . Congestive heart failure with left ventricular diastolic dysfunction, NYHA class 2 ([place])   . Diabetes mellitus    INSULIN DEPENDENT, CONTROLLED Type 2  . Diabetic neuropathy ([place])   . Diabetic neuropathy ([place])   . Diverticulosis of colon   . Hx of colonoscopy    10  YEARS AGO  . Hx of nonmelanoma skin cancer   . Hx of osteomyelitis   . Hyperlipidemia   . Hypertension    BENIGN ESSENTIAL  . Lower extremity venous stasis   . MI (myocardial infarction) ([place]) 09/06/1995  . Mild renal insufficiency    decreased kidney function  . Moderate mitral regurgitation   . Morbid obesity with BMI of 40.0-44.9, adult ([place])   . Obstructive sleep apnea on CPAP    does not wear CPAP, wears 2L O2 [place]  . Osteoarthritis, multiple sites   . Osteomyelitis of vertebra of lumbar region ([place])   . Pneumonia   . Status post coronary artery stent placement 09/09/1995  . Vitamin D deficiency      Assessment: Pharmacy consulted to dose warfarin for this 84 yo male cancer patient with atrial fibrillation and has been on chronic anti-coagulation with warfarin for this condition.   hgb 9.2 Platelets: 258    INR: 2.9>2.4>2.3 Home dose: warfarin 5mg  on Tues/Thurs/Sun and  2.5 mg ROW   Goal of Therapy:  INR 2-3 Monitor platelets by anticoagulation protocol: Yes   Plan:  Warfarin 5 mg x 1 dose Daily INR and CBC Monitor for signs and symptoms of bleeding.  Thomasenia Sales, PharmD, MBA, BCGP Clinical Pharmacist  11/13/2019 8:47 AM

## 2019-11-13 NOTE — Progress Notes (Signed)
PROGRESS NOTE [facility]   Charles Chambers  [id number]  DOB: 1936/08/29  DOA: 11/09/2019 PCP: Earney Mallet, MD   Brief Admission Hx: 84 y.o. 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   MDM/Assessment & Plan:   1. Generalized weakness-PT has evaluated the patient and they have strongly recommended SNF placement.  Patient agreeable.  I asked the social workers to help with placement. 2. Hyponatremia-likely was secondary to diuretics which is currently being held.  Sodium is improving.   3. Atrial Fibrillation -his heart rate remains controlled.  He is fully anticoagulated with warfarin which we are monitoring closely. 4. Chronic diastolic CHF-he presented hypovolemic and his diuretics were held temporarily. 5. Metastatic squamous cell cancer involving the right axillary area-the mass has been treated with immunotherapy. Surgery consulted by family request for recommendations.  Unfortunately this lesion is not amenable to surgical treatment.  Oncology consulted, recommended CT CAP for staging, with findings of enlarged right axillary mass from 9/20 but no other findings of metastatic disease.  Dr. Raliegh Ip says that he can DC to SNF.  Follow up with Dr. Benay Spice in 2-3 weeks for further treatment considerations.   I have also asked for palliative medicine consultation but they will not be able to see until 11/15/19. 6. Squamous cell carcinoma-he completed third cycle of immunotherapy 1 month ago and was scheduled to have further immunotherapy 1/20 but missed the appointment due to being in the hospital. 7. Constipation - laxatives added.   DVT prophylaxis: Warfarin Code  Status: Full Family Communication: Daughter Disposition Plan: Inpatient for IV antibiotics and subspecialty consultation  Consultants:  General surgery  Palliative medicine  Oncology  Procedures:    Antimicrobials:     Subjective: Patient says he wants to ambulate more and needs to have a bowel movement.   Objective: Vitals:   11/12/19 1531 11/12/19 2119 11/13/19 0458 11/13/19 1109  BP: 129/81 130/64 121/73   Pulse: 88 66 81   Resp: 20 20 19    Temp: 98.7 F (37.1 C) 98.6 F (37 C) 97.7 F (36.5 C)   TempSrc:  Oral Oral   SpO2: 100% 100% 100% 94%  Weight:      Height:        Intake/Output Summary (Last 24 hours) at 11/13/2019 1425 Last data filed at 11/13/2019 0514 Gross per 24 hour  Intake --  Output 850 ml  Net -850 ml   Filed Weights   11/09/19 2051 11/09/19 2052  Weight: 118.8 kg 118.8 kg   REVIEW OF SYSTEMS  As per history otherwise all reviewed and reported negative  Exam:  General exam: Chronically ill-appearing male he is lying in bed he is awake and alert and oriented x3 in no apparent distress. Respiratory system: Clear. No increased work of breathing. Cardiovascular system: S1 & S2 heard. No JVD, murmurs, gallops, clicks or pedal edema. Gastrointestinal system: Abdomen is nondistended, soft and nontender. Normal bowel sounds heard. Central nervous system: Alert and oriented. No focal neurological deficits. Extremities: Large ulcerated fungating mass under the right axillary area with serous drainage and foul smell.  Data Reviewed: Basic Metabolic Panel: Recent Labs  Lab 11/09/19 2335 11/10/19 0438 11/11/19 0625 11/12/19 0421  NA 127* 134* 132* 135  K  4.3 4.7 4.7 5.1  CL 92* 97* 98 97*  CO2 26 28 27 30   GLUCOSE 118* 101* 197* 206*  BUN 31* 28* 32* 32*  CREATININE 1.23 1.16 1.15 1.12  CALCIUM 9.3 9.1 8.9 9.2  MG  --   --  1.7  --    Liver Function Tests: Recent Labs  Lab 11/09/19 2335 11/11/19 0625  AST 15 10*  ALT 14 13   ALKPHOS 75 56  BILITOT 1.1 0.5  PROT 6.8 5.7*  ALBUMIN 2.8* 2.3*   No results for input(s): LIPASE, AMYLASE in the last 168 hours. Recent Labs  Lab 11/09/19 2337  AMMONIA 14   CBC: Recent Labs  Lab 11/09/19 2335 11/11/19 0625  WBC 14.5* 10.3  NEUTROABS 12.4* 8.8*  HGB 10.6* 9.2*  HCT 33.7* 30.2*  MCV 96.6 96.8  PLT 320 258   Cardiac Enzymes: Recent Labs  Lab 11/09/19 2335  CKTOTAL 111   CBG (last 3)  Recent Labs    11/12/19 2116 11/13/19 0749 11/13/19 1121  GLUCAP 176* 225* 184*   Recent Results (from the past 240 hour(s))  Blood Culture (routine x 2)     Status: None (Preliminary result)   Collection Time: 11/09/19 11:35 PM   Specimen: BLOOD  Result Value Ref Range Status   Specimen Description BLOOD LEFT ANTECUBITAL  Final   Special Requests   Final    BOTTLES DRAWN AEROBIC AND ANAEROBIC Blood Culture adequate volume   Culture   Final    NO GROWTH 3 DAYS Performed at [facility], [address] [place], [place] [address]    Report Status PENDING  Incomplete  Blood Culture (routine x 2)     Status: None (Preliminary result)   Collection Time: 11/10/19 12:42 AM   Specimen: BLOOD  Result Value Ref Range Status   Specimen Description BLOOD LEFT ANTECUBITAL  Final   Special Requests   Final    BOTTLES DRAWN AEROBIC AND ANAEROBIC Blood Culture adequate volume   Culture   Final    NO GROWTH 3 DAYS Performed at [facility], [address] [place], [place] [address]    Report Status PENDING  Incomplete  Urine culture     Status: None   Collection Time: 11/10/19  1:06 AM   Specimen: In/Out Cath Urine  Result Value Ref Range Status   Specimen Description   Final    IN/OUT CATH URINE Performed at [facility], [address], [place], [place] [address]    Special Requests   Final    NONE Performed at [facility], [address], [place], [place] [address]    Culture   Final    NO GROWTH Performed at [facility], [place] [address]  [place], [place] [address]    Report Status 11/11/2019 FINAL  Final  SARS CORONAVIRUS 2 (TAT 6-24 HRS) Nasopharyngeal Nasopharyngeal Swab     Status: None   Collection Time: 11/10/19  2:33 AM   Specimen: Nasopharyngeal Swab  Result Value Ref Range Status   SARS Coronavirus 2 NEGATIVE NEGATIVE Final    Comment: (NOTE) SARS-CoV-2 target nucleic acids are NOT DETECTED. The SARS-CoV-2 RNA is generally detectable in upper and lower respiratory specimens during the acute phase of infection. Negative results do not preclude SARS-CoV-2 infection, do not rule out co-infections with other pathogens, and should not be used as the sole basis for treatment or other patient management decisions. Negative results must be combined with clinical observations, patient history, and epidemiological  information. The expected result is Negative. Fact Sheet for Patients: SugarRoll.be Fact Sheet for Healthcare Providers: https://www.woods-mathews.com/ This test is not yet approved or cleared by the [place] FDA and  has been authorized for detection and/or diagnosis of SARS-CoV-2 by FDA under an Emergency Use Authorization (EUA). This EUA will remain  in effect (meaning this test can be used) for the duration of the COVID-19 declaration under Section 56 4(b)(1) of the Act, 21 U.S.C. section 360bbb-3(b)(1), unless the authorization is terminated or revoked sooner. Performed at [facility], [place] [address] [place], [place] [address]      Studies: CT ABDOMEN PELVIS WO CONTRAST  Result Date: 11/12/2019 CLINICAL DATA:  Metastatic squamous cell carcinoma involving the right axilla. Weakness, fall. EXAM: CT CHEST, ABDOMEN AND PELVIS WITHOUT CONTRAST TECHNIQUE: Multidetector CT imaging of the chest, abdomen and pelvis was performed following the standard protocol without IV contrast. COMPARISON:  Chest radiograph 11/10/2019 FINDINGS: CT CHEST FINDINGS  Cardiovascular: Coronary, aortic arch, and branch vessel atherosclerotic vascular disease. Mild cardiomegaly. Mediastinum/Nodes: Right axillary mass extends to the cutaneous surface and measures 8.8 by 5.2 cm, compatible with malignancy. Lungs/Pleura: Trace bilateral pleural effusions. Bilateral airway thickening. Bandlike in nodular density in the left upper lobe extending along the major fissure, nodular portion measures 1.1 by 0.5 cm although a benign etiology is favored given the overall configuration. Musculoskeletal: Advanced degenerative left glenohumeral arthropathy. Thoracic spondylosis. CT ABDOMEN PELVIS FINDINGS Hepatobiliary: Nodular liver favoring cirrhosis. Pancreas: Unremarkable Spleen: Unremarkable Adrenals/Urinary Tract: 2.0 by 1.5 cm left adrenal nodule with linear calcification along its margin, nonspecific density. Stomach/Bowel: Descending and sigmoid colon diverticulosis. Vascular/Lymphatic: Aortoiliac atherosclerotic vascular disease. No pathologic adenopathy in the abdomen or pelvis. Reproductive: Unremarkable Other: Presacral edema/fluid, cause uncertain. Musculoskeletal: Posterolateral rod and pedicle screw fixation at L3-L5, no pedicle screws at the L4-5 level although there is potentially inter facet fusion at L4-5. Grade 1 degenerative retrolisthesis at L1-2 and L2-3 with loss of disc height throughout the lumbar spine except at L5-S1. Spurring in the lumbar spine causes bilateral foraminal impingement at all levels, most especially at L4-5 and L5-S1 on the right. I do not see a sacral fracture or specific sacral abnormality to correlate with the presacral edema. IMPRESSION: 1. 8.8 by 5.2 cm right axillary mass extending to the cutaneous surface, compatible with malignancy. 2. No specific findings of metastatic disease to the abdomen or pelvis. 3. Other imaging findings of potential clinical significance: Airway thickening is present, suggesting bronchitis or reactive airways disease.  Trace bilateral pleural effusions. Mild cardiomegaly. Nodular liver favoring cirrhosis. Descending and sigmoid colon diverticulosis. Multilevel impingement in the lumbar spine due to spurring. Advanced degenerative left glenohumeral arthropathy. Nonspecific small left adrenal mass with some linear calcification along its margin. Aortic Atherosclerosis (ICD10-I70.0). Electronically Signed   By: Van Clines M.D.   On: 11/12/2019 09:36   CT CHEST WO CONTRAST  Result Date: 11/12/2019 CLINICAL DATA:  Metastatic squamous cell carcinoma involving the right axilla. Weakness, fall. EXAM: CT CHEST, ABDOMEN AND PELVIS WITHOUT CONTRAST TECHNIQUE: Multidetector CT imaging of the chest, abdomen and pelvis was performed following the standard protocol without IV contrast. COMPARISON:  Chest radiograph 11/10/2019 FINDINGS: CT CHEST FINDINGS Cardiovascular: Coronary, aortic arch, and branch vessel atherosclerotic vascular disease. Mild cardiomegaly. Mediastinum/Nodes: Right axillary mass extends to the cutaneous surface and measures 8.8 by 5.2 cm, compatible with malignancy. Lungs/Pleura: Trace bilateral pleural effusions. Bilateral airway thickening. Bandlike in nodular density in the left upper lobe extending along the major fissure, nodular  portion measures 1.1 by 0.5 cm although a benign etiology is favored given the overall configuration. Musculoskeletal: Advanced degenerative left glenohumeral arthropathy. Thoracic spondylosis. CT ABDOMEN PELVIS FINDINGS Hepatobiliary: Nodular liver favoring cirrhosis. Pancreas: Unremarkable Spleen: Unremarkable Adrenals/Urinary Tract: 2.0 by 1.5 cm left adrenal nodule with linear calcification along its margin, nonspecific density. Stomach/Bowel: Descending and sigmoid colon diverticulosis. Vascular/Lymphatic: Aortoiliac atherosclerotic vascular disease. No pathologic adenopathy in the abdomen or pelvis. Reproductive: Unremarkable Other: Presacral edema/fluid, cause uncertain.  Musculoskeletal: Posterolateral rod and pedicle screw fixation at L3-L5, no pedicle screws at the L4-5 level although there is potentially inter facet fusion at L4-5. Grade 1 degenerative retrolisthesis at L1-2 and L2-3 with loss of disc height throughout the lumbar spine except at L5-S1. Spurring in the lumbar spine causes bilateral foraminal impingement at all levels, most especially at L4-5 and L5-S1 on the right. I do not see a sacral fracture or specific sacral abnormality to correlate with the presacral edema. IMPRESSION: 1. 8.8 by 5.2 cm right axillary mass extending to the cutaneous surface, compatible with malignancy. 2. No specific findings of metastatic disease to the abdomen or pelvis. 3. Other imaging findings of potential clinical significance: Airway thickening is present, suggesting bronchitis or reactive airways disease. Trace bilateral pleural effusions. Mild cardiomegaly. Nodular liver favoring cirrhosis. Descending and sigmoid colon diverticulosis. Multilevel impingement in the lumbar spine due to spurring. Advanced degenerative left glenohumeral arthropathy. Nonspecific small left adrenal mass with some linear calcification along its margin. Aortic Atherosclerosis (ICD10-I70.0). Electronically Signed   By: Van Clines M.D.   On: 11/12/2019 09:36   Scheduled Meds: . atorvastatin  20 mg Oral QHS  . carvedilol  12.5 mg Oral BID WC  . cholecalciferol  3,000 Units Oral Daily  . insulin aspart  0-5 Units Subcutaneous QHS  . insulin aspart  0-9 Units Subcutaneous TID WC  . insulin aspart  7 Units Subcutaneous TID WC  . insulin glargine  18 Units Subcutaneous Daily  . nystatin   Topical BID  . senna-docusate  2 tablet Oral BID  . silver sulfADIAZINE   Topical BID  . sodium chloride flush  3 mL Intravenous Q12H  . warfarin  5 mg Oral ONCE-1800  . Warfarin - Pharmacist Dosing Inpatient   Does not apply q1800   Continuous Infusions:   Principal Problem:   Spell of generalized  weakness Active Problems:   Permanent atrial fibrillation (HCC)   Squamous cell cancer of skin of upper arm, right   CAD (coronary artery disease)   Insulin-requiring or dependent type II diabetes mellitus (HCC)   Chronic diastolic CHF (congestive heart failure) (HCC)   Hyponatremia   Essential hypertension   Pressure injury of skin   Metastatic squamous cell carcinoma (HCC)   Open wound of right axillary region   Weakness  Time spent:   Irwin Brakeman, MD [facility] 11/13/2019, 2:25 PM    LOS: 2 days  How to contact the [facility] Attending or Consulting provider [place] or covering provider during after hours [place], for this patient?  1. Check the care team in [facility] and look for a) attending/consulting [facility] provider listed and b) the [facility] team listed 2. Log into [email or web address] and use [place]'s universal password to access. If you do not have the password, please contact the hospital operator. 3. Locate the [facility] provider you are looking for under [facility] and page to a number that you can be directly reached. 4. If you still have difficulty reaching the provider,  please page the [facility] (Director on Call) for the Hospitalists listed on amion for assistance.

## 2019-11-14 LAB — GLUCOSE, CAPILLARY
Glucose-Capillary: 194 mg/dL — ABNORMAL HIGH (ref 70–99)
Glucose-Capillary: 250 mg/dL — ABNORMAL HIGH (ref 70–99)
Glucose-Capillary: 138 mg/dL — ABNORMAL HIGH (ref 70–99)
Glucose-Capillary: 220 mg/dL — ABNORMAL HIGH (ref 70–99)

## 2019-11-14 LAB — PROTIME-INR
INR: 2.5 — ABNORMAL HIGH (ref 0.8–1.2)
Prothrombin Time: 27.2 seconds — ABNORMAL HIGH (ref 11.4–15.2)

## 2019-11-14 MED ORDER — CARVEDILOL 12.5 MG PO TABS
25.0000 mg | ORAL_TABLET | Freq: Two times a day (BID) | ORAL | Status: DC
  Administered 2019-11-14 – 2019-11-16 (×4): 25 mg via ORAL
  Filled 2019-11-14 (×4): qty 2

## 2019-11-14 MED ORDER — WARFARIN SODIUM 5 MG PO TABS
5.0000 mg | ORAL_TABLET | Freq: Once | ORAL | Status: AC
  Administered 2019-11-14: 5 mg via ORAL
  Filled 2019-11-14: qty 1

## 2019-11-14 NOTE — Progress Notes (Signed)
ANTICOAGULATION CONSULT NOTE -  Pharmacy Consult for warfarin dosing Indication: atrial fibrillation  Allergies  Allergen Reactions  . Naproxen Sodium Other (See Comments) and Nausea And Vomiting    Due to kidney function  . Contrast Media [Iodinated Diagnostic Agents]     : No contrast media dye due to fluctuations with renal function."  . Nsaids     Poor kidney function   . Other Other (See Comments)    Narcotics cause depression, especially codeine Contrast products can not be used due to kidney function   . Baclofen Other (See Comments)    HALLUCINATIONS    . Codeine     depression  . Mushroom Extract Complex Nausea And Vomiting  . Neurontin [Gabapentin] Other (See Comments)    overstimulation    Patient Measurements: Height: 5\' 9"  (175.3 cm) Weight: 262 lb (118.8 kg) IBW/kg (Calculated) : 70.7 Heparin Dosing Weight: HEPARIN DW (KG): 97.5  Vital Signs: Temp: 98 F (36.7 C) (01/24 0654) Temp Source: Oral (01/24 0654) BP: 101/84 (01/24 0654) Pulse Rate: 70 (01/24 0654)  Labs: Recent Labs    11/12/19 0421 11/13/19 0635 11/14/19 0631  LABPROT 26.2* 25.0* 27.2*  INR 2.4* 2.3* 2.5*  CREATININE 1.12  --   --     Estimated Creatinine Clearance: 65.8 mL/min (by C-G formula based on SCr of 1.12 mg/dL).   Medical History: Past Medical History:  Diagnosis Date  . Abnormal gait   . Atrial fibrillation ([place])   . CAD (coronary artery disease)   . 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 Chronic back pain   . Complication of anesthesia    supraclavicular block caused trouble breathing and heartrate dropped  . Congestive heart failure with left ventricular diastolic dysfunction, NYHA class 2 ([place])   . Diabetes mellitus    INSULIN DEPENDENT, CONTROLLED Type 2  . Diabetic neuropathy ([employer])   . Diabetic neuropathy ([place])   . Diverticulosis of colon   . Hx of colonoscopy    10 YEARS AGO  . Hx of nonmelanoma skin cancer   . Hx of  osteomyelitis   . Hyperlipidemia   . Hypertension    BENIGN ESSENTIAL  . Lower extremity venous stasis   . MI (myocardial infarction) ([place]) 09/06/1995  . Mild renal insufficiency    decreased kidney function  . Moderate mitral regurgitation   . Morbid obesity with BMI of 40.0-44.9, adult ([place])   . Obstructive sleep apnea on CPAP    does not wear CPAP, wears 2L O2 [place]  . Osteoarthritis, multiple sites   . Osteomyelitis of vertebra of lumbar region ([place])   . Pneumonia   . Status post coronary artery stent placement 09/09/1995  . Vitamin D deficiency      Assessment: Pharmacy consulted to dose warfarin for this 84 yo male cancer patient with atrial fibrillation and has been on chronic anti-coagulation with warfarin for this condition.   hgb 9.2 Platelets: 258    INR: 2.9>2.4>2.3>2.5 Home dose: warfarin 5mg  on Tues/Thurs/Sun and  2.5 mg ROW   Goal of Therapy:  INR 2-3 Monitor platelets by anticoagulation protocol: Yes   Plan:  Warfarin 5 mg x 1 dose Daily INR and CBC Monitor for signs and symptoms of bleeding.  Thomasenia Sales, PharmD, MBA, BCGP Clinical Pharmacist  11/14/2019 8:58 AM

## 2019-11-14 NOTE — Progress Notes (Signed)
PROGRESS NOTE [name]   Charles Chambers  [id number]  DOB: [birth year]  DOA: 11/09/2019 PCP: Earney Mallet, MD   Brief Admission Hx: 84 y.o. 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   MDM/Assessment & Plan:   1. Generalized weakness-PT has evaluated the patient and they have strongly recommended SNF placement.  Patient agreeable.  I asked the social workers to help with placement.  He should be able to go tomorrow after his palliative care consultation.  2. Hyponatremia-improved. Was secondary to diuretics which is currently being held.   3. Atrial Fibrillation -his heart rate remains controlled.  He is fully anticoagulated with warfarin which we are monitoring closely. INR therapeutic.  4. Chronic diastolic CHF-he presented hypovolemic and his diuretics were held temporarily. 5. Metastatic squamous cell cancer involving the right axillary area-the mass has been treated with immunotherapy. Surgery consulted by family request for recommendations.  Unfortunately this lesion is not amenable to surgical treatment.  Oncology consulted, recommended CT CAP for staging, with findings of enlarged right axillary mass from 9/20 but no other findings of metastatic disease.  Dr. Delton Coombes says that he can DC to SNF.  Follow up with Dr. Benay Spice in 2-3 weeks for further treatment considerations.   I have also asked for palliative medicine consultation but they will not be able to see until 11/15/19. 6. Squamous cell carcinoma-he completed third cycle of immunotherapy 1 month ago and was scheduled to have further immunotherapy 1/20 but missed the appointment due to being in the  hospital. 7. Constipation - laxatives added.   DVT prophylaxis: Warfarin Code Status: Full Family Communication: Daughter Disposition Plan: SNF tomorrow if bed available  Consultants:  General surgery  Palliative medicine  Oncology  Procedures:    Antimicrobials:     Subjective: Patient was able to ambulate some yesterday and sit in chair, feeling a little better today.   Objective: Vitals:   11/13/19 1500 11/13/19 2055 11/13/19 2208 11/14/19 0654  BP: 133/67 108/69  101/84  Pulse: 69 (!) 111 72 70  Resp: 18 18 16 18   Temp: 98.2 F (36.8 C) 99.3 F (37.4 C)  98 F (36.7 C)  TempSrc: Oral Oral  Oral  SpO2: 95% 100% 100% 100%  Weight:      Height:        Intake/Output Summary (Last 24 hours) at 11/14/2019 1034 Last data filed at 11/14/2019 0900 Gross per 24 hour  Intake 1105 ml  Output 3250 ml  Net -2145 ml   Filed Weights   11/09/19 2051 11/09/19 2052  Weight: 118.8 kg 118.8 kg   REVIEW OF SYSTEMS  As per history otherwise all reviewed and reported negative  Exam:  General exam: Chronically ill-appearing male he is lying in bed he is awake and alert and oriented x3 in no apparent distress. Respiratory system: Clear. No increased work of breathing. Cardiovascular system: S1 & S2 heard. No JVD, murmurs, gallops, clicks or pedal edema. Gastrointestinal system: Abdomen is nondistended, soft and nontender. Normal bowel sounds heard. Central nervous system: Alert and oriented. No focal neurological deficits. Extremities: Large ulcerated fungating mass under the right axillary area with serous drainage and foul smell.  Data Reviewed: Basic Metabolic Panel: Recent Labs  Lab 11/09/19  2335 11/10/19 0438 11/11/19 0625 11/12/19 0421  NA 127* 134* 132* 135  K 4.3 4.7 4.7 5.1  CL 92* 97* 98 97*  CO2 26 28 27 30   GLUCOSE 118* 101* 197* 206*  BUN 31* 28* 32* 32*  CREATININE 1.23 1.16 1.15 1.12  CALCIUM 9.3 9.1 8.9 9.2  MG  --   --  1.7  --    Liver  Function Tests: Recent Labs  Lab 11/09/19 2335 11/11/19 0625  AST 15 10*  ALT 14 13  ALKPHOS 75 56  BILITOT 1.1 0.5  PROT 6.8 5.7*  ALBUMIN 2.8* 2.3*   No results for input(s): LIPASE, AMYLASE in the last 168 hours. Recent Labs  Lab 11/09/19 2337  AMMONIA 14   CBC: Recent Labs  Lab 11/09/19 2335 11/11/19 0625  WBC 14.5* 10.3  NEUTROABS 12.4* 8.8*  HGB 10.6* 9.2*  HCT 33.7* 30.2*  MCV 96.6 96.8  PLT 320 258   Cardiac Enzymes: Recent Labs  Lab 11/09/19 2335  CKTOTAL 111   CBG (last 3)  Recent Labs    11/13/19 1638 11/13/19 2058 11/14/19 0724  GLUCAP 142* 187* 194*   Recent Results (from the past 240 hour(s))  Blood Culture (routine x 2)     Status: None (Preliminary result)   Collection Time: 11/09/19 11:35 PM   Specimen: BLOOD  Result Value Ref Range Status   Specimen Description BLOOD LEFT ANTECUBITAL  Final   Special Requests   Final    BOTTLES DRAWN AEROBIC AND ANAEROBIC Blood Culture adequate volume   Culture   Final    NO GROWTH 4 DAYS Performed at [facility], [address], [place], [place] [address]    Report Status PENDING  Incomplete  Blood Culture (routine x 2)     Status: None (Preliminary result)   Collection Time: 11/10/19 12:42 AM   Specimen: BLOOD  Result Value Ref Range Status   Specimen Description BLOOD LEFT ANTECUBITAL  Final   Special Requests   Final    BOTTLES DRAWN AEROBIC AND ANAEROBIC Blood Culture adequate volume   Culture   Final    NO GROWTH 4 DAYS Performed at [facility], [address] [place], [place] [address]    Report Status PENDING  Incomplete  Urine culture     Status: None   Collection Time: 11/10/19  1:06 AM   Specimen: In/Out Cath Urine  Result Value Ref Range Status   Specimen Description   Final    IN/OUT CATH URINE Performed at [facility], [address], [place], [place] [address]    Special Requests   Final    NONE Performed at [facility], [address], [place], [place] [address]     Culture   Final    NO GROWTH Performed at [facility], [place] [address] [place], [place] [address]    Report Status 11/11/2019 FINAL  Final  SARS CORONAVIRUS 2 (TAT 6-24 HRS) Nasopharyngeal Nasopharyngeal Swab     Status: None   Collection Time: 11/10/19  2:33 AM   Specimen: Nasopharyngeal Swab  Result Value Ref Range Status   SARS Coronavirus 2 NEGATIVE NEGATIVE Final    Comment: (NOTE) SARS-CoV-2 target nucleic acids are NOT DETECTED. The SARS-CoV-2 RNA is generally detectable in upper and lower respiratory specimens during the acute phase of infection. Negative results do not preclude SARS-CoV-2 infection, do not rule out co-infections with other pathogens, and should not be used as the sole basis for treatment or other  patient management decisions. Negative results must be combined with clinical observations, patient history, and epidemiological information. The expected result is Negative. Fact Sheet for Patients: SugarRoll.be Fact Sheet for Healthcare Providers: https://www.woods-mathews.com/ This test is not yet approved or cleared by the [place] FDA and  has been authorized for detection and/or diagnosis of SARS-CoV-2 by FDA under an Emergency Use Authorization (EUA). This EUA will remain  in effect (meaning this test can be used) for the duration of the COVID-19 declaration under Section 56 4(b)(1) of the Act, 21 U.S.C. section 360bbb-3(b)(1), unless the authorization is terminated or revoked sooner. Performed at [facility], [place] [address] [place], [place] [address]      Studies: No results found. Scheduled Meds: . atorvastatin  20 mg Oral QHS  . carvedilol  12.5 mg Oral BID WC  . cholecalciferol  3,000 Units Oral Daily  . insulin aspart  0-5 Units Subcutaneous QHS  . insulin aspart  0-9 Units Subcutaneous TID WC  . insulin aspart  7 Units Subcutaneous TID WC  . insulin glargine  18 Units Subcutaneous  Daily  . nystatin   Topical BID  . senna-docusate  2 tablet Oral BID  . silver sulfADIAZINE   Topical BID  . sodium chloride flush  3 mL Intravenous Q12H  . warfarin  5 mg Oral ONCE-1800  . Warfarin - Pharmacist Dosing Inpatient   Does not apply q1800   Continuous Infusions:  Principal Problem:   Spell of generalized weakness Active Problems:   Permanent atrial fibrillation (HCC)   Squamous cell cancer of skin of upper arm, right   CAD (coronary artery disease)   Insulin-requiring or dependent type II diabetes mellitus (HCC)   Chronic diastolic CHF (congestive heart failure) (HCC)   Hyponatremia   Essential hypertension   Pressure injury of skin   Metastatic squamous cell carcinoma (HCC)   Open wound of right axillary region   Weakness  Time spent:   Irwin Brakeman, MD [facility] 11/14/2019, 10:34 AM    LOS: 3 days  How to contact the [facility] Attending or Consulting provider [place] or covering provider during after hours [place], for this patient?  1. Check the care team in [facility] and look for a) attending/consulting [facility] provider listed and b) the [facility] team listed 2. Log into [email or web address] and use [place]'s universal password to access. If you do not have the password, please contact the hospital operator. 3. Locate the [facility] provider you are looking for under [facility] and page to a number that you can be directly reached. 4. If you still have difficulty reaching the provider, please page the [facility] (Director on Call) for the Hospitalists listed on amion for assistance.

## 2019-11-14 NOTE — TOC Progression Note (Signed)
Transition of Care [facility]) - Progression Note    Patient Details  Name: Charles Chambers MRN: [id number] Date of Birth: [birth year]  Transition [facility]) CM/SW Contact  Lasharn Bufkin, Chauncey Reading, RN Phone Number: 11/14/2019, 3:02 PM  Clinical Narrative:   Daughter interested in [place] SNF's due to possibility of being able to switch oncology doctors from [place] to [place]. Referrals sent.     Expected Discharge Plan: [place] Barriers to Discharge: Continued Medical Work up  Expected Discharge Plan and Services Expected Discharge Plan: [place] In-house Referral: Clinical Social Work   Post Acute Care Choice: [place] Living arrangements for the past 2 months: Single Family Home                            Social Determinants of Health (SDOH) Interventions    Readmission Risk Interventions No flowsheet data found.

## 2019-11-15 ENCOUNTER — Encounter (HOSPITAL_COMMUNITY): Payer: Self-pay | Admitting: Family Medicine

## 2019-11-15 DIAGNOSIS — Z515 Encounter for palliative care: Secondary | ICD-10-CM

## 2019-11-15 DIAGNOSIS — W19XXXA Unspecified fall, initial encounter: Secondary | ICD-10-CM

## 2019-11-15 DIAGNOSIS — Z7189 Other specified counseling: Secondary | ICD-10-CM

## 2019-11-15 LAB — GLUCOSE, CAPILLARY
Glucose-Capillary: 202 mg/dL — ABNORMAL HIGH (ref 70–99)
Glucose-Capillary: 183 mg/dL — ABNORMAL HIGH (ref 70–99)
Glucose-Capillary: 139 mg/dL — ABNORMAL HIGH (ref 70–99)
Glucose-Capillary: 249 mg/dL — ABNORMAL HIGH (ref 70–99)

## 2019-11-15 LAB — CBC
HCT: 32.7 % — ABNORMAL LOW (ref 39.0–52.0)
Hemoglobin: 9.8 g/dL — ABNORMAL LOW (ref 13.0–17.0)
MCH: 29.5 pg (ref 26.0–34.0)
MCHC: 30 g/dL (ref 30.0–36.0)
MCV: 98.5 fL (ref 80.0–100.0)
Platelets: 244 10*3/uL (ref 150–400)
RBC: 3.32 MIL/uL — ABNORMAL LOW (ref 4.22–5.81)
RDW: 14 % (ref 11.5–15.5)
WBC: 10.4 10*3/uL (ref 4.0–10.5)
nRBC: 0 % (ref 0.0–0.2)

## 2019-11-15 LAB — CULTURE, BLOOD (ROUTINE X 2)
Culture: NO GROWTH
Culture: NO GROWTH
Special Requests: ADEQUATE
Special Requests: ADEQUATE

## 2019-11-15 LAB — PROTIME-INR
INR: 2.7 — ABNORMAL HIGH (ref 0.8–1.2)
Prothrombin Time: 28.2 seconds — ABNORMAL HIGH (ref 11.4–15.2)

## 2019-11-15 MED ORDER — FUROSEMIDE 20 MG PO TABS
20.0000 mg | ORAL_TABLET | ORAL | Status: DC
  Administered 2019-11-15: 20 mg via ORAL
  Filled 2019-11-15: qty 1

## 2019-11-15 MED ORDER — HYDROCODONE-ACETAMINOPHEN 5-325 MG PO TABS
1.0000 | ORAL_TABLET | Freq: Four times a day (QID) | ORAL | Status: DC | PRN
  Filled 2019-11-15: qty 1

## 2019-11-15 MED ORDER — SENNOSIDES-DOCUSATE SODIUM 8.6-50 MG PO TABS: 2.0000 | ORAL_TABLET | Freq: Every evening | ORAL | Status: DC | PRN

## 2019-11-15 MED ORDER — WARFARIN SODIUM 2.5 MG PO TABS
2.5000 mg | ORAL_TABLET | Freq: Once | ORAL | Status: AC
  Administered 2019-11-15: 2.5 mg via ORAL
  Filled 2019-11-15: qty 1

## 2019-11-15 NOTE — TOC Progression Note (Signed)
Transition of Care [facility]) - Progression Note    Patient Details  Name: Charles Chambers MRN: [id number] Date of Birth: 1935/12/12  Transition of Care [facility]) CM/SW Contact  Boneta Lucks, RN Phone Number: 11/15/2019, 3:07 PM  Clinical Narrative:   [facility] called to say they would have a bed tomorrow.  CM spoke with Katharine Look - daughter.  After consult with Dr Raliegh Ip. She understands, patient can not go to SNF and get treatment at the same time. Patient is to follow up with Dr Benay Spice in 2 - 3 weeks. Patient can get rehab and make follow up oncology appointment for after his 20 days of rehab.  Daughter rather him be in [place], since he can not get treatment during rehab.  She does not know if he would qualify, but thinking he may need long term. She will see how he does with rehab and see what oncology says at next appointment. Per Deitra Mayo will be ready tomorrow. RN, MD, and daughter updated.   Expected Discharge Plan: [place] Barriers to Discharge: Continued Medical Work up  Expected Discharge Plan and Services Expected Discharge Plan: [place] In-house Referral: Clinical Social Work   Post Acute Care Choice: [place] Living arrangements for the past 2 months: [place]

## 2019-11-15 NOTE — Consult Note (Signed)
Consultation Note Date: 11/15/2019   Patient Name: Charles Chambers  DOB: Sep 12, 1936  MRN: [id number]  Age / Sex: 84 y.o., male  PCP: Charles Mallet, MD Referring Physician: Murlean Iba, MD  Reason for Consultation: Establishing goals of care and Psychosocial/spiritual support  HPI/Patient Profile: 84 y.o. 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   Clinical Assessment and Goals of Care: I have reviewed medical records including EPIC notes, labs and imaging, received report from attending, spoke on the phone with daughter, Charles Chambers to discuss diagnosis prognosis, [place], EOL wishes, disposition and options.  I introduced Palliative Medicine as specialized medical care for people living with serious illness. It focuses on providing relief from the symptoms and stress of a serious illness.   We discussed a brief life review of the patient.  Mr. Charles Chambers tells me that he is divorced, he has 1 child, daughter Charles Chambers.  Mr. 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   As far as functional and nutritional status, Mr. Charles Chambers tells me that he had been independent with IADLs until this recent fall.  He lives alone.  We discussed current illness and what it means in the larger context of on-going co-morbidities.  Natural disease trajectory and expectations at EOL were discussed.  I asked Mr. Charles Chambers if he has been told that his cancer is curable and he tells me that the oncologist told him "I do not have any problem curing that cancer".  Mr. Charles Chambers also tells me that the oncologist told him, "I know  what I can do".  We talked about following up outpatient with oncology.  I attempted to elicit values and goals of care important to the patient.    Advanced directives, concepts specific to code status, artifical feeding and hydration, and rehospitalization were considered and discussed.  Full scope/full code.  See below.   Questions and concerns were addressed.    Call to daughter, Charles Chambers.  She is able to accurately name all of her father's health issues and treatment plans.  We talk about rehab and oncology outpatient follow ups. She names her worry the cancer won't not be able to be cured.  We talk about code status.  Charles Chambers shares her experience working, it seems that she knows the reality of what modern medicine can and cannot achieve. Therapeutic listening provided to daughter Charles Chambers as she describes her efforts to care for her father who raised her as a single father from age 12.  Charles Chambers endorses her gratefulness to the medical team, in particular Dr. Wynetta Chambers.   HCPOA    NEXT OF KIN -Mr. Charles Chambers tells me that his daughter, Charles Chambers would be his healthcare surrogate.  He shares that he has completed paperwork naming her HC POA,  also tells me he has a living will.    SUMMARY OF RECOMMENDATIONS   Full scope/full code Follow-up with oncology outpatient   Code Status/Advance Care Planning:  Full code -we talked about CODE STATUS.  Mr. Charles Chambers tells me that he would want life support, he would be agreeable to trach/PEG if needed, long-term care if needed.  I encouraged him to consider treat the treatable, but allowing natural death".  Symptom Management:   Per hospitalist, no additional needs at this time.  Palliative Prophylaxis:   Frequent Pain Assessment, Oral Care and Turn Reposition  Additional Recommendations (Limitations, Scope, Preferences):  Full Scope Treatment  Psycho-social/Spiritual:   Desire for further Chaplaincy support:no  Additional  Recommendations: Caregiving  Support/Resources and Education on Hospice  Prognosis:   Unable to determine, based on outcomes.  Guarded.  Discharge Planning: DC to SNF rehab, follow-up with oncologist outpatient      Primary Diagnoses: Present on Admission: . Permanent atrial fibrillation ([place]) . CAD (coronary artery disease) . Chronic diastolic CHF (congestive heart failure) ([place]) . Hyponatremia . Essential hypertension   I have reviewed the medical record, interviewed the patient and family, and examined the patient. The following aspects are pertinent.  Past Medical History:  Diagnosis Date  . Abnormal gait   . Atrial fibrillation ([place])   . CAD (coronary artery disease)   . 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 Chronic back pain   . Complication of anesthesia    supraclavicular block caused trouble breathing and heartrate dropped  . Congestive heart failure with left ventricular diastolic dysfunction, NYHA class 2 ([place])   . Diabetes mellitus    INSULIN DEPENDENT, CONTROLLED Type 2  . Diabetic neuropathy ([place])   . Diabetic neuropathy ([place])   . Diverticulosis of colon   . Hx of colonoscopy    10 YEARS AGO  . Hx of nonmelanoma skin cancer   . Hx of osteomyelitis   . Hyperlipidemia   . Hypertension    BENIGN ESSENTIAL  . Lower extremity venous stasis   . MI (myocardial infarction) ([place]) 09/06/1995  . Mild renal insufficiency    decreased kidney function  . Moderate mitral regurgitation   . Morbid obesity with BMI of 40.0-44.9, adult ([place])   . Obstructive sleep apnea on CPAP    does not wear CPAP, wears 2L O2 [place]  . Osteoarthritis, multiple sites   . Osteomyelitis of vertebra of lumbar region ([place])   . Pneumonia   . Status post coronary artery stent placement 09/09/1995  . Vitamin D deficiency    Social History   Socioeconomic History  . Marital status: Married    Spouse name: Not on file  . Number of children: 1  . Years of  education: Not on file  . Highest education level: Not on file  Occupational History  . Not on file  Tobacco Use  . Smoking status: Former Smoker    Types: Cigarettes  . Smokeless tobacco: Never Used  . Tobacco comment:  " stopped smoking cigarettes in 1970 "  Substance and Sexual Activity  . Alcohol use: No  . Drug use: No  . Sexual activity: Not on file  Other Topics Concern  . Not on file  Social History Narrative   Lives in [place]   Daughter provides consent for him   retired   Investment banker, operational of Radio broadcast assistant Strain:   . Difficulty of Paying Living Expenses: Not on file  Food Insecurity:   . Worried About Charity fundraiser in the Last Year: Not on file  . Ran Out of Food in the Last Year: Not on file  Transportation Needs:   . Lack of Transportation (Medical): Not on file  . Lack of Transportation (Non-Medical): Not on file  Physical Activity:   . Days of Exercise per Week: Not on file  . Minutes of Exercise per Session: Not on file  Stress:   . Feeling of Stress : Not on file  Social Connections:   . Frequency of Communication with Friends and Family: Not on file  . Frequency of Social Gatherings with Friends and Family: Not on file  . Attends Religious Services: Not on file  . Active Member of Clubs or Organizations: Not on file  . Attends Archivist Meetings: Not on file  . Marital Status: Not on file   Family History  Problem Relation Age of Onset  . Heart disease Mother 67  . Heart disease Father    Scheduled Meds: . atorvastatin  20 mg Oral QHS  . carvedilol  25 mg Oral BID WC  . cholecalciferol  3,000 Units Oral Daily  . insulin aspart  0-5 Units Subcutaneous QHS  . insulin aspart  0-9 Units Subcutaneous TID WC  . insulin aspart  7 Units Subcutaneous TID WC  . insulin glargine  18 Units Subcutaneous Daily  . nystatin   Topical BID  . silver sulfADIAZINE   Topical BID  . sodium chloride flush  3 mL Intravenous Q12H    . warfarin  2.5 mg Oral Once  . Warfarin - Pharmacist Dosing Inpatient   Does not apply q1800   Continuous Infusions: PRN Meds:.acetaminophen **OR** acetaminophen, HYDROcodone-acetaminophen, nitroGLYCERIN, ondansetron **OR** ondansetron (ZOFRAN) IV, senna-docusate, traMADol Medications Prior to Admission:  Prior to Admission medications   Medication Sig Start Date End Date Taking? Authorizing Provider  acetaminophen (TYLENOL) 500 MG tablet Take 1,000 mg by mouth 2 (two) times daily as needed for moderate pain.    Yes [provider]  aspirin EC 81 MG tablet Take 81 mg by mouth daily.     Yes [provider]  atorvastatin (LIPITOR) 20 MG tablet Take 20 mg by mouth at bedtime.    Yes [provider]  carvedilol (COREG) 25 MG tablet Take 25 mg by mouth 2 (two) times daily with a meal.    Yes [provider]  Cholecalciferol (VITAMIN D3) 3000 UNITS TABS Take 3,000 Units by mouth daily.    Yes [provider]  furosemide (LASIX) 20 MG tablet Take 20 mg by mouth every Monday, Wednesday, and Friday.   Yes [provider]  hydrochlorothiazide (HYDRODIURIL) 25 MG tablet Take 25 mg by mouth daily.     Yes [provider]  insulin lispro protamine-insulin lispro (HUMALOG 75/25) (75-25) 100 UNIT/ML SUSP Inject 17-34 Units into the skin See admin instructions. Inject 34 units SQ in the morning and inject 17 units SQ in the evening   Yes [provider]  lisinopril (PRINIVIL,ZESTRIL) 20 MG tablet Take 20 mg by mouth daily.   Yes [provider]  nitroGLYCERIN (NITROSTAT) 0.4 MG SL tablet Place 0.4 mg under the tongue every 5 (five) minutes as needed for chest pain.  05/21/18  Yes [provider]  OXYGEN Inhale 2 L into the lungs See admin instructions. Use 2 L at bedtime and as needed for shortness of breath   Yes [provider]  traMADol (  ULTRAM) 50 MG tablet Take 1 tablet (50 mg total) by mouth every 6 (six)  hours as needed for moderate pain. 03/21/19 03/20/20 Yes Roseanne Kaufman, MD  warfarin (COUMADIN) 5 MG tablet Take 2.5-5 mg by mouth See admin instructions. Take 5 mg in the evening Tue, Thur, and Sun after evening meals. Take 2.5 mg in the evening on Mon, Wed, Fri, and Sat   Yes [provider]  silver sulfADIAZINE (SILVADENE) 1 % cream Apply to affected area twice a  day Patient not taking: Reported on 11/10/2019 10/19/19 10/18/20  Aviva Signs, MD   Allergies  Allergen Reactions  . Naproxen Sodium Other (See Comments) and Nausea And Vomiting    Due to kidney function  . Contrast Media [Iodinated Diagnostic Agents]     : No contrast media dye due to fluctuations with renal function."  . Nsaids     Poor kidney function   . Other Other (See Comments)    Narcotics cause depression, especially codeine Contrast products can not be used due to kidney function   . Baclofen Other (See Comments)    HALLUCINATIONS    . Codeine     depression  . Mushroom Extract Complex Nausea And Vomiting  . Neurontin [Gabapentin] Other (See Comments)    overstimulation   Review of Systems  Unable to perform ROS: Age    Physical Exam Constitutional:      General: He is not in acute distress.    Appearance: He is obese. He is ill-appearing.  HENT:     Head: Normocephalic and atraumatic.  Cardiovascular:     Rate and Rhythm: Normal rate.  Pulmonary:     Effort: Pulmonary effort is normal. No respiratory distress.  Abdominal:     Palpations: Abdomen is soft.     Comments: Obese   Skin:    General: Skin is warm and dry.  Neurological:     Mental Status: He is alert and oriented to person, place, and time.  Psychiatric:        Mood and Affect: Mood normal.        Behavior: Behavior normal.     Vital Signs: BP 140/80 (BP Location: Right Wrist)   Pulse 84   Temp 98.6 F (37 C) (Oral)   Resp 18   Ht 5\' 9"  (1.753 m)   Wt 118.8 kg   SpO2 100%   BMI 38.69 kg/m  Pain Scale: 0-10     Pain Score: 0-No pain   SpO2: SpO2: 100 % O2 Device:SpO2: 100 % O2 Flow Rate: .O2 Flow Rate (L/min): 2 L/min  IO: Intake/output summary:   Intake/Output Summary (Last 24 hours) at 11/15/2019 1330 Last data filed at 11/15/2019 1000 Gross per 24 hour  Intake 480 ml  Output 2500 ml  Net -2020 ml    LBM: Last BM Date: 11/15/19 Baseline Weight: Weight: 118.8 kg Most recent weight: Weight: 118.8 kg     Palliative Assessment/Data:   Flowsheet Rows     Most Recent Value  Intake Tab  Referral Department  Hospitalist  Unit at Time of Referral  Med/Surg Unit  Palliative Care Primary Diagnosis  Cancer  Date Notified  11/11/19  Palliative Care Type  New Palliative care  Reason for referral  Clarify Goals of Care  Date of Admission  11/09/19  Date first seen by Palliative Care  11/15/19  # of days Palliative referral response time  4 Day(s)  # of days IP prior to Palliative referral  2  Clinical Assessment  Palliative Performance Scale Score  40%  Pain Max last 24 hours  Not able to report  Pain Min Last 24 hours  Not able to report  Dyspnea Max Last 24 Hours  Not able to report  Dyspnea Min Last 24 hours  Not able to report  Psychosocial & Spiritual Assessment  Palliative Care Outcomes      Time In: 1030 Time Out: 1140 Time Total: 70 minutes   Greater than 50%  of this time was spent counseling and coordinating care related to the above assessment and plan.  Signed by: Drue Novel, NP   Please contact Palliative Medicine Team phone at [phone] for questions and concerns.  For individual provider: See Shea Evans

## 2019-11-15 NOTE — Progress Notes (Signed)
PROGRESS NOTE [facility]   Sian Vallier  [id number]  DOB: 1936-03-25  DOA: 11/09/2019 PCP: Earney Mallet, MD  Brief Admission Hx: 84 y.o. 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   MDM/Assessment & Plan:   1. Generalized weakness-PT has evaluated the patient and they have strongly recommended SNF placement.  Patient agreeable.  I asked the social workers to help with placement.  Our tentative plan is for patient to discharge to his SNF in [place].  Here he can focus on recovery improving strength so that he will be able to complete chemotherapy/immunotherapy treatments. 2. Hyponatremia-improved. Was secondary to diuretics.  We initially held them but now we have resumed.  HCTZ has been held.  Lasix restarted. 3. Atrial Fibrillation -his heart rate remains controlled.  He is fully anticoagulated with warfarin which we are monitoring closely. INR therapeutic.  4. Chronic diastolic CHF-he presented hypovolemic and his diuretics were held temporarily but lasix resumed MWF like he takes at home. 5. Metastatic squamous cell cancer involving the right axillary area-the mass has been treated with immunotherapy. Surgery consulted by family request for recommendations.  Unfortunately this lesion is not amenable to surgical treatment.  Oncology consulted, recommended CT CAP for staging, with findings of enlarged right axillary mass from 9/20 but no other findings of metastatic disease.  Dr. Delton Coombes says that he can DC to SNF.  Follow up with Dr. Benay Spice in 2-3 weeks for further treatment considerations.   Family now requesting to establish with Dr.  Delton Coombes locally due to patient going to a SNF in [place] area.   6. Squamous cell carcinoma-he completed third cycle of immunotherapy 1 month ago and was scheduled to have further immunotherapy 1/20 but missed the appointment due to being in the hospital. 7. Constipation - laxatives added.   DVT prophylaxis: Warfarin Code Status: Full Family Communication: Daughter telephone Disposition Plan: SNF when bed available  Consultants:  General surgery  Palliative medicine  Oncology  Procedures:    Antimicrobials:     Subjective: Patient says his constipation is relieved.  He had several bowel movements.  He has been ambulating.    Objective: Vitals:   11/14/19 1415 11/14/19 1434 11/14/19 2105 11/15/19 0528  BP: 124/72  117/62 140/80  Pulse: (!) 114 66 82 84  Resp: 18  18   Temp: 99.2 F (37.3 C)  98.2 F (36.8 C) 98.6 F (37 C)  TempSrc: Oral   Oral  SpO2: 99%  99% 100%  Weight:      Height:        Intake/Output Summary (Last 24 hours) at 11/15/2019 1401 Last data filed at 11/15/2019 1000 Gross per 24 hour  Intake 480 ml  Output 2500 ml  Net -2020 ml   Filed Weights   11/09/19 2051 11/09/19 2052  Weight: 118.8 kg 118.8 kg   REVIEW OF SYSTEMS  As per history otherwise all reviewed and reported negative  Exam:  General exam: Chronically ill-appearing male he is lying in bed he is awake and alert and oriented x3 in no apparent distress. Respiratory system: Clear. No increased work of breathing. Cardiovascular system: S1 & S2 heard. No JVD, murmurs, gallops, clicks or pedal edema. Gastrointestinal system: Abdomen is  nondistended, soft and nontender. Normal bowel sounds heard. Central nervous system: Alert and oriented. No focal neurological deficits. Extremities: Large ulcerated fungating mass under the right axillary area with serous drainage and foul smell.  Data Reviewed: Basic Metabolic Panel: Recent Labs  Lab 11/09/19 2335 11/10/19 0438  11/11/19 0625 11/12/19 0421  NA 127* 134* 132* 135  K 4.3 4.7 4.7 5.1  CL 92* 97* 98 97*  CO2 26 28 27 30   GLUCOSE 118* 101* 197* 206*  BUN 31* 28* 32* 32*  CREATININE 1.23 1.16 1.15 1.12  CALCIUM 9.3 9.1 8.9 9.2  MG  --   --  1.7  --    Liver Function Tests: Recent Labs  Lab 11/09/19 2335 11/11/19 0625  AST 15 10*  ALT 14 13  ALKPHOS 75 56  BILITOT 1.1 0.5  PROT 6.8 5.7*  ALBUMIN 2.8* 2.3*   No results for input(s): LIPASE, AMYLASE in the last 168 hours. Recent Labs  Lab 11/09/19 2337  AMMONIA 14   CBC: Recent Labs  Lab 11/09/19 2335 11/11/19 0625 11/15/19 0515  WBC 14.5* 10.3 10.4  NEUTROABS 12.4* 8.8*  --   HGB 10.6* 9.2* 9.8*  HCT 33.7* 30.2* 32.7*  MCV 96.6 96.8 98.5  PLT 320 258 244   Cardiac Enzymes: Recent Labs  Lab 11/09/19 2335  CKTOTAL 111   CBG (last 3)  Recent Labs    11/14/19 2108 11/15/19 0749 11/15/19 1107  GLUCAP 220* 202* 183*   Recent Results (from the past 240 hour(s))  Blood Culture (routine x 2)     Status: None   Collection Time: 11/09/19 11:35 PM   Specimen: BLOOD  Result Value Ref Range Status   Specimen Description BLOOD LEFT ANTECUBITAL  Final   Special Requests   Final    BOTTLES DRAWN AEROBIC AND ANAEROBIC Blood Culture adequate volume   Culture   Final    NO GROWTH 5 DAYS Performed at [facility], [address], [place], [place] [address]    Report Status 11/15/2019 FINAL  Final  Blood Culture (routine x 2)     Status: None   Collection Time: 11/10/19 12:42 AM   Specimen: BLOOD  Result Value Ref Range Status   Specimen Description BLOOD LEFT ANTECUBITAL  Final   Special Requests   Final    BOTTLES DRAWN AEROBIC AND ANAEROBIC Blood Culture adequate volume   Culture   Final    NO GROWTH 5 DAYS Performed at [facility], [address], [place], [place] [address]    Report Status 11/15/2019 FINAL  Final  Urine culture     Status: None   Collection Time: 11/10/19  1:06 AM   Specimen: In/Out Cath Urine   Result Value Ref Range Status   Specimen Description   Final    IN/OUT CATH URINE Performed at [facility], [address] [place] [address], [place], [place] [address]    Special Requests   Final    NONE Performed at [facility], [address], [place], [place] [address]    Culture   Final    NO GROWTH Performed at [facility], [place] [address], [place], [place] [address]    Report Status 11/11/2019 FINAL  Final  SARS CORONAVIRUS 2 (TAT 6-24 HRS) Nasopharyngeal Nasopharyngeal Swab     Status: None   Collection Time: 11/10/19  2:33 AM   Specimen: Nasopharyngeal Swab  Result Value Ref Range Status   SARS Coronavirus 2 NEGATIVE NEGATIVE Final    Comment: (NOTE)  SARS-CoV-2 target nucleic acids are NOT DETECTED. The SARS-CoV-2 RNA is generally detectable in upper and lower respiratory specimens during the acute phase of infection. Negative results do not preclude SARS-CoV-2 infection, do not rule out co-infections with other pathogens, and should not be used as the sole basis for treatment or other patient management decisions. Negative results must be combined with clinical observations, patient history, and epidemiological information. The expected result is Negative. Fact Sheet for Patients: SugarRoll.be Fact Sheet for Healthcare Providers: https://www.woods-mathews.com/ This test is not yet approved or cleared by the [place] FDA and  has been authorized for detection and/or diagnosis of SARS-CoV-2 by FDA under an Emergency Use Authorization (EUA). This EUA will remain  in effect (meaning this test can be used) for the duration of the COVID-19 declaration under Section 56 4(b)(1) of the Act, 21 U.S.C. section 360bbb-3(b)(1), unless the authorization is terminated or revoked sooner. Performed at [facility], [place] [address] [place], [place] [address]      Studies: No results found. Scheduled Meds: . atorvastatin  20 mg Oral QHS   . carvedilol  25 mg Oral BID WC  . cholecalciferol  3,000 Units Oral Daily  . insulin aspart  0-5 Units Subcutaneous QHS  . insulin aspart  0-9 Units Subcutaneous TID WC  . insulin aspart  7 Units Subcutaneous TID WC  . insulin glargine  18 Units Subcutaneous Daily  . nystatin   Topical BID  . silver sulfADIAZINE   Topical BID  . sodium chloride flush  3 mL Intravenous Q12H  . warfarin  2.5 mg Oral Once  . Warfarin - Pharmacist Dosing Inpatient   Does not apply q1800   Continuous Infusions:  Principal Problem:   Spell of generalized weakness Active Problems:   Permanent atrial fibrillation (HCC)   Squamous cell cancer of skin of upper arm, right   CAD (coronary artery disease)   Insulin-requiring or dependent type II diabetes mellitus (HCC)   Chronic diastolic CHF (congestive heart failure) (HCC)   Hyponatremia   Essential hypertension   Pressure injury of skin   Metastatic squamous cell carcinoma (HCC)   Open wound of right axillary region   Weakness  Time spent:   Irwin Brakeman, MD [facility] 11/15/2019, 2:01 PM    LOS: 4 days  How to contact the [facility] Attending or Consulting provider [place] or covering provider during after hours [place], for this patient?  1. Check the care team in [facility] and look for a) attending/consulting TR[facility] provider listed and b) the [facility] team listed 2. Log into [email or web address] and use [place]'s universal password to access. If you do not have the password, please contact the hospital operator. 3. Locate the [facility] provider you are looking for under [facility] and page to a number that you can be directly reached. 4. If you still have difficulty reaching the provider, please page the [facility] (Director on Call) for the Hospitalists listed on amion for assistance.

## 2019-11-15 NOTE — Care Management Important Message (Signed)
Important Message  Patient Details  Name: Charles Chambers MRN: [id number] Date of Birth: [birth year]   Medicare Important Message Given:  Yes     [name] 11/15/2019, 4:29 PM

## 2019-11-15 NOTE — Progress Notes (Signed)
ANTICOAGULATION CONSULT NOTE -  Pharmacy Consult for warfarin dosing Indication: atrial fibrillation  Allergies  Allergen Reactions  . Naproxen Sodium Other (See Comments) and Nausea And Vomiting    Due to kidney function  . Contrast Media [Iodinated Diagnostic Agents]     : No contrast media dye due to fluctuations with renal function."  . Nsaids     Poor kidney function   . Other Other (See Comments)    Narcotics cause depression, especially codeine Contrast products can not be used due to kidney function   . Baclofen Other (See Comments)    HALLUCINATIONS    . Codeine     depression  . Mushroom Extract Complex Nausea And Vomiting  . Neurontin [Gabapentin] Other (See Comments)    overstimulation    Patient Measurements: Height: 5\' 9"  (175.3 cm) Weight: 262 lb (118.8 kg) IBW/kg (Calculated) : 70.7 HEPARIN DW (KG): 97.5  Vital Signs: Temp: 98.6 F (37 C) (01/25 0528) Temp Source: Oral (01/25 0528) BP: 140/80 (01/25 0528) Pulse Rate: 84 (01/25 0528)  Labs: Recent Labs    11/13/19 0635 11/14/19 0631 11/15/19 0515  HGB  --   --  9.8*  HCT  --   --  32.7*  PLT  --   --  244  LABPROT 25.0* 27.2* 28.2*  INR 2.3* 2.5* 2.7*    Estimated Creatinine Clearance: 65.8 mL/min (by C-G formula based on SCr of 1.12 mg/dL).   Medical History: Past Medical History:  Diagnosis Date  . Abnormal gait   . Atrial fibrillation ([place])   . CAD (coronary artery disease)   . 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 Chronic back pain   . Complication of anesthesia    supraclavicular block caused trouble breathing and heartrate dropped  . Congestive heart failure with left ventricular diastolic dysfunction, NYHA class 2 ([place])   . Diabetes mellitus    INSULIN DEPENDENT, CONTROLLED Type 2  . Diabetic neuropathy ([place])   . Diabetic neuropathy ([place])   . Diverticulosis of colon   . Hx of colonoscopy    10 YEARS AGO  . Hx of nonmelanoma skin cancer   . Hx  of osteomyelitis   . Hyperlipidemia   . Hypertension    BENIGN ESSENTIAL  . Lower extremity venous stasis   . MI (myocardial infarction) ([place]) 09/06/1995  . Mild renal insufficiency    decreased kidney function  . Moderate mitral regurgitation   . Morbid obesity with BMI of 40.0-44.9, adult ([place])   . Obstructive sleep apnea on CPAP    does not wear CPAP, wears 2L O2 [place]  . Osteoarthritis, multiple sites   . Osteomyelitis of vertebra of lumbar region ([place])   . Pneumonia   . Status post coronary artery stent placement 09/09/1995  . Vitamin D deficiency      Assessment: Pharmacy consulted to dose warfarin for this 84 yo male cancer patient with atrial fibrillation and has been on chronic anti-coagulation with warfarin for this condition.   hgb 9.2>> 9.8 Platelets: 258>> 244   INR: 2.9>2.4>2.3>2.5>> 2.7 Home dose: warfarin 5mg  on Tues/Thurs/Sun and  2.5 mg ROW   Goal of Therapy:  INR 2-3 Monitor platelets by anticoagulation protocol: Yes   Plan:  Warfarin 2.5 mg x 1 dose Daily INR and CBC Monitor for signs and symptoms of bleeding.  Isac Sarna, BS [place], [place] Clinical Pharmacist Pager [phone] 11/15/2019 8:48 AM

## 2019-11-15 NOTE — Progress Notes (Signed)
PT Cancellation Note  Patient Details Name: Charles Chambers MRN: [id number] DOB: [birth year]   Cancelled Treatment:     Pt deep alseep; unable to arouse.     Rayetta Humphrey, PT CLT [phone] 11/15/2019, 3:56 PM

## 2019-11-16 LAB — PROTIME-INR
INR: 2.6 — ABNORMAL HIGH (ref 0.8–1.2)
Prothrombin Time: 27.8 seconds — ABNORMAL HIGH (ref 11.4–15.2)

## 2019-11-16 LAB — GLUCOSE, CAPILLARY
Glucose-Capillary: 196 mg/dL — ABNORMAL HIGH (ref 70–99)
Glucose-Capillary: 208 mg/dL — ABNORMAL HIGH (ref 70–99)

## 2019-11-16 MED ORDER — SILVER SULFADIAZINE 1 % EX CREA: TOPICAL_CREAM | Freq: Two times a day (BID) | CUTANEOUS | 0 refills | Status: AC

## 2019-11-16 MED ORDER — SENNOSIDES-DOCUSATE SODIUM 8.6-50 MG PO TABS: 2.0000 | ORAL_TABLET | Freq: Every evening | ORAL | Status: AC | PRN

## 2019-11-16 MED ORDER — WARFARIN SODIUM 5 MG PO TABS: 5.0000 mg | ORAL_TABLET | Freq: Once | ORAL | Status: DC

## 2019-11-16 MED ORDER — NYSTATIN 100000 UNIT/GM EX POWD: Freq: Two times a day (BID) | CUTANEOUS | 0 refills | Status: AC

## 2019-11-16 NOTE — Progress Notes (Signed)
Physical Therapy Treatment Patient Details Name: Charles Chambers MRN: [id number] DOB: 23-Jun-1936 Today's Date: 11/16/2019    History of Present Illness Charles Chambers is a 84 y.o. 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  [address] and hit his head in the bathroom, was too weak in general to get up from the floor, and laid there for approximately 4 hours before his daughter came home and found him.  She was unable to help him up and EMS was activated.  Patient is not sure how he fell, reports that he did hit his head but did not lose consciousness.  Denies any associated chest pain or lightheadedness, and denies any change in his chronic pain after the fall.  He denies any recent fevers, chills, or cough.  He has chronic dyspnea and uses supplemental oxygen at baseline.  Denies any recent abdominal pain, vomiting, or diarrhea.  He had his third cycle of immunotherapy 1 month ago for squamous cell carcinoma and has an open wound in the right axilla where a tumor ruptured.  His daughter provides wound care for him and keeps a close eye on his medical conditions.    PT Comments    Patient agreeable for therapy and demonstrates labored movement with difficulty moving legs during supine to sitting, very unsteady on feet and limited to a few side steps at bedside due to fall risk and c/o fatigue.  Patient on room air with SpO2 maintaining at 93-96%, but patient insists on wearing O2 due to c/o difficulty breathing, and prior to transferring to chair bleeding noted from groin area where condom cath attached due to skin tear - RN aware.  Patient tolerated sitting up in chair after therapy.  Patient will  benefit from continued physical therapy in hospital and recommended venue below to increase strength, balance, endurance for safe ADLs and gait.    Follow Up Recommendations  SNF;Supervision/Assistance - 24 hour;Supervision for mobility/OOB     Equipment Recommendations  None recommended by PT    Recommendations for Other Services       Precautions / Restrictions Precautions Precautions: Fall Restrictions Weight Bearing Restrictions: No    Mobility  Bed Mobility Overal bed mobility: Needs Assistance Bed Mobility: Supine to Sit     Supine to sit: Min assist     General bed mobility comments: increased time, labored movement  Transfers Overall transfer level: Needs assistance Equipment used: Rolling walker (2 wheeled) Transfers: Sit to/from [facility] Sit to Stand: Mod assist Stand pivot transfers: Mod assist       General transfer comment: had difficulty with sit to stands due to BLE weakness  Ambulation/Gait Ambulation/Gait assistance: Mod assist Gait Distance (Feet): 5 Feet Assistive device: Rolling walker (2 wheeled) Gait Pattern/deviations: Decreased step length - right;Decreased step length - left;Decreased stride length Gait velocity: slow   General Gait Details: limited to 5-6 slow labored unsteady side steps at bedside due to c/o fatigue and legs giving way   Stairs             Wheelchair Mobility    Modified Rankin (Stroke Patients Only)       Balance Overall balance assessment: Needs assistance Sitting-balance support: Feet supported;No upper extremity supported Sitting balance-Leahy Scale: Good  Sitting balance - Comments: seated at EOB   Standing balance support: During functional activity;Bilateral upper extremity supported Standing balance-Leahy Scale: Fair Standing balance comment: using RW                            Cognition Arousal/Alertness: Awake/alert Behavior During Therapy: WFL for tasks  assessed/performed Overall Cognitive Status: Within Functional Limits for tasks assessed                                        Exercises General Exercises - Lower Extremity Long Arc Quad: Seated;AROM;Strengthening;Both;10 reps Hip Flexion/Marching: Seated;AROM;Strengthening;Both;10 reps Toe Raises: Seated;AROM;Strengthening;Both;10 reps Heel Raises: Seated;AROM;Strengthening;Both;10 reps    General Comments        Pertinent Vitals/Pain Pain Assessment: Faces Faces Pain Scale: Hurts a little bit Pain Location: groin area and right hand Pain Descriptors / Indicators: Sore;Discomfort Pain Intervention(s): Limited activity within patient's tolerance;Monitored during session    Home Living                      Prior Function            PT Goals (current goals can now be found in the care plan section) Acute Rehab PT Goals Patient Stated Goal: return home with family to assist PT Goal Formulation: With patient Time For Goal Achievement: 12/11/19 Potential to Achieve Goals: Good Progress towards PT goals: Progressing toward goals    Frequency    Min 3X/week      PT Plan Current plan remains appropriate    Co-evaluation              AM-PAC PT "6 Clicks" Mobility   Outcome Measure  Help needed turning from your back to your side while in a flat bed without using bedrails?: A Little Help needed moving from lying on your back to sitting on the side of a flat bed without using bedrails?: A Lot Help needed moving to and from a bed to a chair (including a wheelchair)?: A Lot Help needed standing up from a chair using your arms (e.g., wheelchair or bedside chair)?: A Lot Help needed to walk in hospital room?: A Lot Help needed climbing 3-5 steps with a railing? : Total 6 Click Score: 12    End of Session Equipment Utilized During Treatment: Oxygen Activity Tolerance: Patient tolerated treatment well;Patient limited by fatigue Patient  left: in chair;with call bell/phone within reach Nurse Communication: Mobility status PT Visit Diagnosis: Unsteadiness on feet (R26.81);Other abnormalities of gait and mobility (R26.89);Muscle weakness (generalized) (M62.81)     Time: [id number] PT Time Calculation (min) (ACUTE ONLY): 31 min  Charges:  $Therapeutic Exercise: 8-22 mins $Therapeutic Activity: 8-22 mins                     9:58 AM, 11/16/19 Lonell Grandchild, MPT Physical Therapist with [facility] 336 [phone] office [phone] mobile phone

## 2019-11-16 NOTE — Discharge Summary (Addendum)
Physician Discharge Summary  Doctor Cobas [id number] DOB: 1936-06-22 DOA: 11/09/2019  PCP: Earney Mallet, MD Oncologist: Dr. Benay Spice    Admit date: 11/09/2019 Discharge date: 11/16/2019  Admitted From:  Home  Disposition: SNF   Recommendations for Outpatient Follow-up:  1. Follow up with oncologist Dr. Benay Spice in 2-3 weeks 2. Please check PT/INR weekly to follow while on warfarin 3. Please check CBG at least 3 times per day and as needed when not feeling well. 4. Continue local wound care to right axillary lesion.   Discharge Condition: STABLE   CODE STATUS: FULL    Brief Hospitalization Summary: Please see all hospital notes, images, labs for full details of the hospitalization. ADMISSION HPI: Charles Chambers is a 84 y.o. 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  [address] and hit his head in the bathroom, was too weak in general to get up from the floor, and laid there for approximately 4 hours before his daughter came home and found him.  She was unable to help him up and EMS was activated.  Patient is not sure how he fell, reports that he did hit his head but did not lose consciousness.  Denies any associated chest pain or lightheadedness, and denies any change in his chronic pain after the fall.  He denies any recent fevers, chills, or cough.  He has chronic dyspnea and uses supplemental oxygen at baseline.  Denies any recent abdominal pain, vomiting, or diarrhea.  He had his third cycle of immunotherapy 1 month ago for squamous cell carcinoma and has an open wound in the right axilla where a tumor ruptured.  His daughter provides wound care for him and keeps a close eye on  his medical conditions.  ED Course: Upon arrival to the ED, patient is found to be afebrile, saturating mid 90s on room air, and with normal respiratory and heart rate, and normal blood pressure.  EKG features atrial fibrillation with IVCD and T wave inversions that appear similar to prior.  Noncontrast head CT is negative for acute findings.  Chest x-ray with cardiomegaly and left basilar atelectasis.  Chemistry panel notable for sodium of 127.  Ammonia and serum CK are normal.  CBC with leukocytosis to 14,500 and hemoglobin 10.6, down from 11.9 a month ago.  Lactic acid was 2.2 initially, INR 3.3, BNP 177, and troponin negative x2.  Blood and urine cultures were collected in the ED, 1 L of normal saline was administered, patient was treated with IV Rocephin, Covid PCR screening test has not yet resulted, and hospitalists are consulted for admission.  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    MDM/Assessment & Plan:   1. Generalized weakness-PT has evaluated the patient and they have strongly recommended SNF placement.  Patient agreeable.  I asked the social workers to help with placement.  Our  plan is for patient to discharge to his SNF in [place] today per family's wishes.  Here he can  focus on recovery improving strength so that he will be able to complete chemotherapy/immunotherapy treatments. 2. Hyponatremia-improved. Was secondary to diuretics.  We initially held them but now we have resumed.  HCTZ has been discontinued.  Home dose Lasix restarted. 3. Atrial Fibrillation -his heart rate remains controlled.  He is fully anticoagulated with  warfarin which we are monitoring closely. INR therapeutic.  He is on Coreg for rate control.  4. Chronic diastolic CHF-resume home dose lasix. 5. Metastatic squamous cell cancer involving the right axillary area-the mass has been treated with immunotherapy. Surgery consulted by family request for recommendations.  Unfortunately this lesion is not amenable to surgical treatment.  Oncology consulted, recommended CT CAP for staging, with findings of enlarged right axillary mass from 9/20 but no other findings of metastatic disease.  Dr. Delton Coombes says that he can DC to SNF.  Follow up with Dr. Benay Spice in 2-3 weeks for further treatment considerations.     6. Squamous cell carcinoma-he completed third cycle of immunotherapy 1 month ago and was scheduled to have further immunotherapy 1/20 but missed the appointment due to being in the hospital. 7. Constipation - laxatives as needed.   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, right   CAD (coronary artery disease)   Insulin-requiring or dependent type II diabetes mellitus (HCC)   Chronic diastolic CHF (congestive heart failure) (HCC)   Hyponatremia   Essential hypertension   Pressure injury of skin   Metastatic squamous cell carcinoma (HCC)   Open wound of right axillary region   Weakness   Fall   Palliative care by specialist   DNR (do not resuscitate) discussion   Discharge Instructions:  Allergies as of 11/16/2019      Reactions   Naproxen Sodium Other (See Comments), Nausea And Vomiting   Due to kidney function   Contrast Media [iodinated Diagnostic Agents]    : No contrast media dye due to fluctuations with  renal function."   Nsaids    Poor kidney function    Other Other (See Comments)   Narcotics cause depression, especially codeine Contrast products can not be used due to kidney function   Baclofen Other (See Comments)   HALLUCINATIONS    Codeine    depression   Mushroom Extract Complex Nausea And Vomiting   Neurontin [gabapentin] Other (See Comments)   overstimulation      Medication List    STOP taking these medications   hydrochlorothiazide 25 MG tablet Commonly known as: HYDRODIURIL   lisinopril 20 MG tablet Commonly known as: ZESTRIL   traMADol 50 MG tablet Commonly known as: Ultram     TAKE these medications   acetaminophen 500 MG tablet Commonly known as: TYLENOL Take 1,000 mg by mouth 2 (two) times daily as needed for moderate pain.   aspirin EC 81 MG tablet Take 81 mg by mouth daily.   atorvastatin 20 MG tablet Commonly known as: LIPITOR Take 20 mg by mouth at bedtime.   carvedilol 25 MG tablet Commonly known as: COREG Take 25 mg by mouth 2 (two) times daily with a meal.   furosemide 20 MG tablet Commonly known as: LASIX Take 20 mg by mouth every Monday, Wednesday, and Friday.  insulin lispro protamine-lispro (75-25) 100 UNIT/ML Susp injection Commonly known as: HUMALOG 75/25 MIX Inject 17-34 Units into the skin See admin instructions. Inject 34 units SQ in the morning and inject 17 units SQ in the evening   nitroGLYCERIN 0.4 MG SL tablet Commonly known as: NITROSTAT Place 0.4 mg under the tongue every 5 (five) minutes as needed for chest pain.   nystatin powder Commonly known as: MYCOSTATIN/NYSTOP Apply topically 2 (two) times daily.   OXYGEN Inhale 2 L into the lungs See admin instructions. Use 2 L at bedtime and as needed for shortness of breath   senna-docusate 8.6-50 MG tablet Commonly known as: Senokot-S Take 2 tablets by mouth at bedtime as needed for mild constipation.   silver sulfADIAZINE 1 % cream Commonly known as:  SILVADENE Apply topically 2 (two) times daily. What changed:   how to take this  when to take this  additional instructions   Vitamin D3 75 MCG (3000 UT) Tabs Take 3,000 Units by mouth daily.   warfarin 5 MG tablet Commonly known as: COUMADIN Take 2.5-5 mg by mouth See admin instructions. Take 5 mg in the evening Tue, Thur, and Sun after evening meals. Take 2.5 mg in the evening on Mon, Wed, Fri, and Sat       Allergies  Allergen Reactions  . Naproxen Sodium Other (See Comments) and Nausea And Vomiting    Due to kidney function  . Contrast Media [Iodinated Diagnostic Agents]     : No contrast media dye due to fluctuations with renal function."  . Nsaids     Poor kidney function   . Other Other (See Comments)    Narcotics cause depression, especially codeine Contrast products can not be used due to kidney function   . Baclofen Other (See Comments)    HALLUCINATIONS    . Codeine     depression  . Mushroom Extract Complex Nausea And Vomiting  . Neurontin [Gabapentin] Other (See Comments)    overstimulation   Allergies as of 11/16/2019      Reactions   Naproxen Sodium Other (See Comments), Nausea And Vomiting   Due to kidney function   Contrast Media [iodinated Diagnostic Agents]    : No contrast media dye due to fluctuations with renal function."   Nsaids    Poor kidney function    Other Other (See Comments)   Narcotics cause depression, especially codeine Contrast products can not be used due to kidney function   Baclofen Other (See Comments)   HALLUCINATIONS    Codeine    depression   Mushroom Extract Complex Nausea And Vomiting   Neurontin [gabapentin] Other (See Comments)   overstimulation      Medication List    STOP taking these medications   hydrochlorothiazide 25 MG tablet Commonly known as: HYDRODIURIL   lisinopril 20 MG tablet Commonly known as: ZESTRIL   traMADol 50 MG tablet Commonly known as: Ultram     TAKE these medications    acetaminophen 500 MG tablet Commonly known as: TYLENOL Take 1,000 mg by mouth 2 (two) times daily as needed for moderate pain.   aspirin EC 81 MG tablet Take 81 mg by mouth daily.   atorvastatin 20 MG tablet Commonly known as: LIPITOR Take 20 mg by mouth at bedtime.   carvedilol 25 MG tablet Commonly known as: COREG Take 25 mg by mouth 2 (two) times daily with a meal.   furosemide 20 MG tablet Commonly known as: LASIX Take 20 mg by  mouth every Monday, Wednesday, and Friday.   insulin lispro protamine-lispro (75-25) 100 UNIT/ML Susp injection Commonly known as: HUMALOG 75/25 MIX Inject 17-34 Units into the skin See admin instructions. Inject 34 units SQ in the morning and inject 17 units SQ in the evening   nitroGLYCERIN 0.4 MG SL tablet Commonly known as: NITROSTAT Place 0.4 mg under the tongue every 5 (five) minutes as needed for chest pain.   nystatin powder Commonly known as: MYCOSTATIN/NYSTOP Apply topically 2 (two) times daily.   OXYGEN Inhale 2 L into the lungs See admin instructions. Use 2 L at bedtime and as needed for shortness of breath   senna-docusate 8.6-50 MG tablet Commonly known as: Senokot-S Take 2 tablets by mouth at bedtime as needed for mild constipation.   silver sulfADIAZINE 1 % cream Commonly known as: SILVADENE Apply topically 2 (two) times daily. What changed:   how to take this  when to take this  additional instructions   Vitamin D3 75 MCG (3000 UT) Tabs Take 3,000 Units by mouth daily.   warfarin 5 MG tablet Commonly known as: COUMADIN Take 2.5-5 mg by mouth See admin instructions. Take 5 mg in the evening Tue, Thur, and Sun after evening meals. Take 2.5 mg in the evening on Mon, Wed, Fri, and Sat       Procedures/Studies: CT ABDOMEN PELVIS WO CONTRAST  Result Date: 11/12/2019 CLINICAL DATA:  Metastatic squamous cell carcinoma involving the right axilla. Weakness, fall. EXAM: CT CHEST, ABDOMEN AND PELVIS WITHOUT CONTRAST  TECHNIQUE: Multidetector CT imaging of the chest, abdomen and pelvis was performed following the standard protocol without IV contrast. COMPARISON:  Chest radiograph 11/10/2019 FINDINGS: CT CHEST FINDINGS Cardiovascular: Coronary, aortic arch, and branch vessel atherosclerotic vascular disease. Mild cardiomegaly. Mediastinum/Nodes: Right axillary mass extends to the cutaneous surface and measures 8.8 by 5.2 cm, compatible with malignancy. Lungs/Pleura: Trace bilateral pleural effusions. Bilateral airway thickening. Bandlike in nodular density in the left upper lobe extending along the major fissure, nodular portion measures 1.1 by 0.5 cm although a benign etiology is favored given the overall configuration. Musculoskeletal: Advanced degenerative left glenohumeral arthropathy. Thoracic spondylosis. CT ABDOMEN PELVIS FINDINGS Hepatobiliary: Nodular liver favoring cirrhosis. Pancreas: Unremarkable Spleen: Unremarkable Adrenals/Urinary Tract: 2.0 by 1.5 cm left adrenal nodule with linear calcification along its margin, nonspecific density. Stomach/Bowel: Descending and sigmoid colon diverticulosis. Vascular/Lymphatic: Aortoiliac atherosclerotic vascular disease. No pathologic adenopathy in the abdomen or pelvis. Reproductive: Unremarkable Other: Presacral edema/fluid, cause uncertain. Musculoskeletal: Posterolateral rod and pedicle screw fixation at L3-L5, no pedicle screws at the L4-5 level although there is potentially inter facet fusion at L4-5. Grade 1 degenerative retrolisthesis at L1-2 and L2-3 with loss of disc height throughout the lumbar spine except at L5-S1. Spurring in the lumbar spine causes bilateral foraminal impingement at all levels, most especially at L4-5 and L5-S1 on the right. I do not see a sacral fracture or specific sacral abnormality to correlate with the presacral edema. IMPRESSION: 1. 8.8 by 5.2 cm right axillary mass extending to the cutaneous surface, compatible with malignancy. 2. No  specific findings of metastatic disease to the abdomen or pelvis. 3. Other imaging findings of potential clinical significance: Airway thickening is present, suggesting bronchitis or reactive airways disease. Trace bilateral pleural effusions. Mild cardiomegaly. Nodular liver favoring cirrhosis. Descending and sigmoid colon diverticulosis. Multilevel impingement in the lumbar spine due to spurring. Advanced degenerative left glenohumeral arthropathy. Nonspecific small left adrenal mass with some linear calcification along its margin. Aortic Atherosclerosis (ICD10-I70.0). Electronically Signed  By: Van Clines M.D.   On: 11/12/2019 09:36   CT Head Wo Contrast  Result Date: 11/10/2019 CLINICAL DATA:  Head trauma and headache. Fall. EXAM: CT HEAD WITHOUT CONTRAST TECHNIQUE: Contiguous axial images were obtained from the base of the skull through the vertex without intravenous contrast. COMPARISON:  None. FINDINGS: Brain: There is no mass, hemorrhage or extra-axial collection. There is generalized atrophy without lobar predilection. Hypodensity of the white matter is most commonly associated with chronic microvascular disease. Vascular: Atherosclerotic calcification of the vertebral and internal carotid arteries at the skull base. No abnormal hyperdensity of the major intracranial arteries or dural venous sinuses. Skull: The visualized skull base, calvarium and extracranial soft tissues are normal. Sinuses/Orbits: No fluid levels or advanced mucosal thickening of the visualized paranasal sinuses. No mastoid or middle ear effusion. The orbits are normal. IMPRESSION: Chronic small vessel disease and generalized volume loss without acute intracranial abnormality. Electronically Signed   By: Ulyses Jarred M.D.   On: 11/10/2019 00:43   CT CHEST WO CONTRAST  Result Date: 11/12/2019 CLINICAL DATA:  Metastatic squamous cell carcinoma involving the right axilla. Weakness, fall. EXAM: CT CHEST, ABDOMEN AND PELVIS  WITHOUT CONTRAST TECHNIQUE: Multidetector CT imaging of the chest, abdomen and pelvis was performed following the standard protocol without IV contrast. COMPARISON:  Chest radiograph 11/10/2019 FINDINGS: CT CHEST FINDINGS Cardiovascular: Coronary, aortic arch, and branch vessel atherosclerotic vascular disease. Mild cardiomegaly. Mediastinum/Nodes: Right axillary mass extends to the cutaneous surface and measures 8.8 by 5.2 cm, compatible with malignancy. Lungs/Pleura: Trace bilateral pleural effusions. Bilateral airway thickening. Bandlike in nodular density in the left upper lobe extending along the major fissure, nodular portion measures 1.1 by 0.5 cm although a benign etiology is favored given the overall configuration. Musculoskeletal: Advanced degenerative left glenohumeral arthropathy. Thoracic spondylosis. CT ABDOMEN PELVIS FINDINGS Hepatobiliary: Nodular liver favoring cirrhosis. Pancreas: Unremarkable Spleen: Unremarkable Adrenals/Urinary Tract: 2.0 by 1.5 cm left adrenal nodule with linear calcification along its margin, nonspecific density. Stomach/Bowel: Descending and sigmoid colon diverticulosis. Vascular/Lymphatic: Aortoiliac atherosclerotic vascular disease. No pathologic adenopathy in the abdomen or pelvis. Reproductive: Unremarkable Other: Presacral edema/fluid, cause uncertain. Musculoskeletal: Posterolateral rod and pedicle screw fixation at L3-L5, no pedicle screws at the L4-5 level although there is potentially inter facet fusion at L4-5. Grade 1 degenerative retrolisthesis at L1-2 and L2-3 with loss of disc height throughout the lumbar spine except at L5-S1. Spurring in the lumbar spine causes bilateral foraminal impingement at all levels, most especially at L4-5 and L5-S1 on the right. I do not see a sacral fracture or specific sacral abnormality to correlate with the presacral edema. IMPRESSION: 1. 8.8 by 5.2 cm right axillary mass extending to the cutaneous surface, compatible with  malignancy. 2. No specific findings of metastatic disease to the abdomen or pelvis. 3. Other imaging findings of potential clinical significance: Airway thickening is present, suggesting bronchitis or reactive airways disease. Trace bilateral pleural effusions. Mild cardiomegaly. Nodular liver favoring cirrhosis. Descending and sigmoid colon diverticulosis. Multilevel impingement in the lumbar spine due to spurring. Advanced degenerative left glenohumeral arthropathy. Nonspecific small left adrenal mass with some linear calcification along its margin. Aortic Atherosclerosis (ICD10-I70.0). Electronically Signed   By: Van Clines M.D.   On: 11/12/2019 09:36   DG Chest Port 1 View  Result Date: 11/10/2019 CLINICAL DATA:  Weakness. Fall yesterday. EXAM: PORTABLE CHEST 1 VIEW COMPARISON:  None. FINDINGS: Cardiomegaly. Aortic atherosclerosis. Streaky left lower lobe atelectasis. No confluent airspace disease, pulmonary edema, large pleural effusion or pneumothorax. No  acute osseous abnormalities. Advanced chronic degenerative changes of both shoulders. IMPRESSION: Cardiomegaly with left lower lobe atelectasis. Aortic Atherosclerosis (ICD10-I70.0). Electronically Signed   By: Keith Rake M.D.   On: 11/10/2019 00:36     Subjective: Pt without complaints, he is agreeable to going to SNF for rehab.    Discharge Exam: Vitals:   11/15/19 2027 11/16/19 0510  BP:  118/71  Pulse:  60  Resp:  20  Temp:  97.8 F (36.6 C)  SpO2: 99% 99%   Vitals:   11/15/19 1409 11/15/19 2019 11/15/19 2027 11/16/19 0510  BP: 109/69 122/73  118/71  Pulse: 62 (!) 104  60  Resp:  20  20  Temp: 97.6 F (36.4 C) 99.6 F (37.6 C)  97.8 F (36.6 C)  TempSrc: Oral Oral  Oral  SpO2: 100% 100% 99% 99%  Weight:      Height:        General exam: Chronically ill-appearing male he is lying in bed he is awake and alert and oriented x3 in no apparent distress. Respiratory system: Clear. No increased work of  breathing. Cardiovascular system: S1 & S2 heard. No JVD, murmurs, gallops, clicks or pedal edema. Gastrointestinal system: Abdomen is nondistended, soft and nontender. Normal bowel sounds heard. Central nervous system: Alert and oriented. No focal neurological deficits. Extremities: Large ulcerated fungating mass under the right axillary area with serous drainage and foul smell.   The results of significant diagnostics from this hospitalization (including imaging, microbiology, ancillary and laboratory) are listed below for reference.     Microbiology: Recent Results (from the past 240 hour(s))  Blood Culture (routine x 2)     Status: None   Collection Time: 11/09/19 11:35 PM   Specimen: BLOOD  Result Value Ref Range Status   Specimen Description BLOOD LEFT ANTECUBITAL  Final   Special Requests   Final    BOTTLES DRAWN AEROBIC AND ANAEROBIC Blood Culture adequate volume   Culture   Final    NO GROWTH 5 DAYS Performed at [facility], [address], [place], [place] [address]    Report Status 11/15/2019 FINAL  Final  Blood Culture (routine x 2)     Status: None   Collection Time: 11/10/19 12:42 AM   Specimen: BLOOD  Result Value Ref Range Status   Specimen Description BLOOD LEFT ANTECUBITAL  Final   Special Requests   Final    BOTTLES DRAWN AEROBIC AND ANAEROBIC Blood Culture adequate volume   Culture   Final    NO GROWTH 5 DAYS Performed at [facility], [address], [place], [place] [address]    Report Status 11/15/2019 FINAL  Final  Urine culture     Status: None   Collection Time: 11/10/19  1:06 AM   Specimen: In/Out Cath Urine  Result Value Ref Range Status   Specimen Description   Final    IN/OUT CATH URINE Performed at [facility], [address], [place], [place] [address]    Special Requests   Final    NONE Performed at [facility], [address], [place], [place] [address]    Culture   Final    NO GROWTH Performed at [facility], [place] [address], [place], [place] [address]    Report Status 11/11/2019 FINAL  Final  SARS CORONAVIRUS 2 (TAT 6-24 HRS) Nasopharyngeal Nasopharyngeal Swab     Status: None   Collection Time: 11/10/19  2:33 AM   Specimen: Nasopharyngeal Swab  Result Value Ref  Range Status   SARS Coronavirus 2 NEGATIVE NEGATIVE Final    Comment: (NOTE) SARS-CoV-2 target nucleic acids are NOT DETECTED. The SARS-CoV-2 RNA is generally detectable in upper and lower respiratory specimens during the acute phase of infection. Negative results do not preclude SARS-CoV-2 infection, do not rule out co-infections with other pathogens, and should not be used as the sole basis for treatment or other patient management decisions. Negative results must be combined with clinical observations, patient history, and epidemiological information. The expected result is Negative. Fact Sheet for Patients: SugarRoll.be Fact Sheet for Healthcare Providers: https://www.woods-mathews.com/ This test is not yet approved or cleared by the [place] FDA and  has been authorized for detection and/or diagnosis of SARS-CoV-2 by FDA under an Emergency Use Authorization (EUA). This EUA will remain  in effect (meaning this test can be used) for the duration of the COVID-19 declaration under Section 56 4(b)(1) of the Act, 21 U.S.C. section 360bbb-3(b)(1), unless the authorization is terminated or revoked sooner. Performed at [facility], [place] [address] [place], [place] [address]      Labs: BNP (last 3 results) Recent Labs    11/09/19 2337  BNP [id number]*   Basic Metabolic Panel: Recent Labs  Lab 11/09/19 2335 11/10/19 0438 11/11/19 0625 11/12/19 0421  NA 127* 134* 132* 135  K 4.3 4.7 4.7 5.1  CL 92* 97* 98 97*  CO2 26 28 27 30   GLUCOSE 118* 101* 197* 206*  BUN 31* 28* 32* 32*  CREATININE 1.23 1.16 1.15 1.12  CALCIUM 9.3 9.1 8.9 9.2  MG  --   --  1.7  --    Liver Function  Tests: Recent Labs  Lab 11/09/19 2335 11/11/19 0625  AST 15 10*  ALT 14 13  ALKPHOS 75 56  BILITOT 1.1 0.5  PROT 6.8 5.7*  ALBUMIN 2.8* 2.3*   No results for input(s): LIPASE, AMYLASE in the last 168 hours. Recent Labs  Lab 11/09/19 2337  AMMONIA 14   CBC: Recent Labs  Lab 11/09/19 2335 11/11/19 0625 11/15/19 0515  WBC 14.5* 10.3 10.4  NEUTROABS 12.4* 8.8*  --   HGB 10.6* 9.2* 9.8*  HCT 33.7* 30.2* 32.7*  MCV 96.6 96.8 98.5  PLT 320 258 244   Cardiac Enzymes: Recent Labs  Lab 11/09/19 2335  CKTOTAL 111   BNP: Invalid input(s): POCBNP CBG: Recent Labs  Lab 11/15/19 0749 11/15/19 1107 11/15/19 1613 11/15/19 2113 11/16/19 0914  GLUCAP 202* 183* 139* 249* 196*   D-Dimer No results for input(s): DDIMER in the last 72 hours. Hgb A1c No results for input(s): HGBA1C in the last 72 hours. Lipid Profile No results for input(s): CHOL, HDL, LDLCALC, TRIG, CHOLHDL, LDLDIRECT in the last 72 hours. Thyroid function studies No results for input(s): TSH, T4TOTAL, T3FREE, THYROIDAB in the last 72 hours.  Invalid input(s): FREET3 Anemia work up No results for input(s): VITAMINB12, FOLATE, FERRITIN, TIBC, IRON, RETICCTPCT in the last 72 hours. Urinalysis    Component Value Date/Time   COLORURINE YELLOW 11/10/2019 0106   APPEARANCEUR CLEAR 11/10/2019 0106   LABSPEC 1.013 11/10/2019 0106   PHURINE 5.0 11/10/2019 0106   GLUCOSEU NEGATIVE 11/10/2019 0106   HGBUR NEGATIVE 11/10/2019 0106   BILIRUBINUR NEGATIVE 11/10/2019 0106   KETONESUR NEGATIVE 11/10/2019 0106   PROTEINUR NEGATIVE 11/10/2019 0106   NITRITE NEGATIVE 11/10/2019 0106   LEUKOCYTESUR NEGATIVE 11/10/2019 0106   Sepsis Labs Invalid input(s): PROCALCITONIN,  WBC,  LACTICIDVEN Microbiology Recent Results (from the past 240 hour(s))  Blood Culture (routine x 2)     Status: None   Collection Time: 11/09/19 11:35 PM   Specimen: BLOOD  Result Value Ref Range Status   Specimen Description BLOOD LEFT  ANTECUBITAL  Final   Special Requests   Final    BOTTLES DRAWN AEROBIC AND ANAEROBIC Blood Culture adequate volume   Culture   Final    NO GROWTH 5 DAYS Performed at [facility], [address], [place], [place] [address]    Report Status 11/15/2019 FINAL  Final  Blood Culture (routine x 2)     Status: None   Collection Time: 11/10/19 12:42 AM   Specimen: BLOOD  Result Value Ref Range Status   Specimen Description BLOOD LEFT ANTECUBITAL  Final   Special Requests   Final    BOTTLES DRAWN AEROBIC AND ANAEROBIC Blood Culture adequate volume   Culture   Final    NO GROWTH 5 DAYS Performed at [facility], [address], [place], [place] [address]    Report Status 11/15/2019 FINAL  Final  Urine culture     Status: None   Collection Time: 11/10/19  1:06 AM   Specimen: In/Out Cath Urine  Result Value Ref Range Status   Specimen Description   Final    IN/OUT CATH URINE Performed at [facility], [address], [place], [place] [address]    Special Requests   Final    NONE Performed at [facility], [address], [place], [place] [address]    Culture   Final    NO GROWTH Performed at [facility], [place] [address], [place], [place] [address]    Report Status 11/11/2019 FINAL  Final  SARS CORONAVIRUS 2 (TAT 6-24 HRS) Nasopharyngeal Nasopharyngeal Swab     Status: None   Collection Time: 11/10/19  2:33 AM   Specimen: Nasopharyngeal Swab  Result Value Ref Range Status   SARS Coronavirus 2 NEGATIVE NEGATIVE Final    Comment: (NOTE) SARS-CoV-2 target nucleic acids are NOT DETECTED. The SARS-CoV-2 RNA is generally detectable in upper and lower respiratory specimens during the acute phase of infection. Negative results do not preclude SARS-CoV-2 infection, do not rule out co-infections with other pathogens, and should not be used as the sole basis for treatment or other patient management decisions. Negative results must be combined with clinical observations, patient history,  and epidemiological information. The expected result is Negative. Fact Sheet for Patients: SugarRoll.be Fact Sheet for Healthcare Providers: https://www.woods-mathews.com/ This test is not yet approved or cleared by the [place] FDA and  has been authorized for detection and/or diagnosis of SARS-CoV-2 by FDA under an Emergency Use Authorization (EUA). This EUA will remain  in effect (meaning this test can be used) for the duration of the COVID-19 declaration under Section 56 4(b)(1) of the Act, 21 U.S.C. section 360bbb-3(b)(1), unless the authorization is terminated or revoked sooner. Performed at [facility], [place] [address], [place], [place] [address]    Time coordinating discharge: 33 minutes   SIGNED:  Irwin Brakeman, MD  [facility] 11/16/2019, 10:18 AM How to contact the [facility] Attending or Consulting provider [place] or covering provider during after hours [place], for this patient?  1. Check the care team in [facility] and look for a) attending/consulting TRH provider listed and b) the [facility] team listed 2. Log into [email or web address] and use [place]'s universal password to access. If you do not have the password, please contact the hospital operator. 3. Locate  the [facility] provider you are looking for under [facility] and page to a number that you can be directly reached. 4. If you still have difficulty reaching the provider, please page the [facility] (Director on Call) for the Hospitalists listed on amion for assistance.

## 2019-11-16 NOTE — Progress Notes (Signed)
ANTICOAGULATION CONSULT NOTE -  Pharmacy Consult for warfarin dosing Indication: atrial fibrillation  Allergies  Allergen Reactions  . Naproxen Sodium Other (See Comments) and Nausea And Vomiting    Due to kidney function  . Contrast Media [Iodinated Diagnostic Agents]     : No contrast media dye due to fluctuations with renal function."  . Nsaids     Poor kidney function   . Other Other (See Comments)    Narcotics cause depression, especially codeine Contrast products can not be used due to kidney function   . Baclofen Other (See Comments)    HALLUCINATIONS    . Codeine     depression  . Mushroom Extract Complex Nausea And Vomiting  . Neurontin [Gabapentin] Other (See Comments)    overstimulation    Patient Measurements: Height: 5\' 9"  (175.3 cm) Weight: 262 lb (118.8 kg) IBW/kg (Calculated) : 70.7 HEPARIN DW (KG): 97.5  Vital Signs: Temp: 97.8 F (36.6 C) (01/26 0510) Temp Source: Oral (01/26 0510) BP: 118/71 (01/26 0510) Pulse Rate: 60 (01/26 0510)  Labs: Recent Labs    11/14/19 0631 11/15/19 0515 11/16/19 0504  HGB  --  9.8*  --   HCT  --  32.7*  --   PLT  --  244  --   LABPROT 27.2* 28.2* 27.8*  INR 2.5* 2.7* 2.6*    Estimated Creatinine Clearance: 65.8 mL/min (by C-G formula based on SCr of 1.12 mg/dL).   Medical History: Past Medical History:  Diagnosis Date  . Abnormal gait   . Atrial fibrillation ([place])   . CAD (coronary artery disease)   . 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 Chronic back pain   . Complication of anesthesia    supraclavicular block caused trouble breathing and heartrate dropped  . Congestive heart failure with left ventricular diastolic dysfunction, NYHA class 2 ([place])   . Diabetes mellitus    INSULIN DEPENDENT, CONTROLLED Type 2  . Diabetic neuropathy ([place])   . Diabetic neuropathy ([place])   . Diverticulosis of colon   . Hx of colonoscopy    10 YEARS AGO  . Hx of nonmelanoma skin cancer   .  Hx of osteomyelitis   . Hyperlipidemia   . Hypertension    BENIGN ESSENTIAL  . Lower extremity venous stasis   . MI (myocardial infarction) ([place]) 09/06/1995  . Mild renal insufficiency    decreased kidney function  . Moderate mitral regurgitation   . Morbid obesity with BMI of 40.0-44.9, adult ([place])   . Obstructive sleep apnea on CPAP    does not wear CPAP, wears 2L O2 [place]  . Osteoarthritis, multiple sites   . Osteomyelitis of vertebra of lumbar region ([place])   . Pneumonia   . Status post coronary artery stent placement 09/09/1995  . Vitamin D deficiency      Assessment: Pharmacy consulted to dose warfarin for this 84 yo male cancer patient with atrial fibrillation and has been on chronic anti-coagulation with warfarin for this condition.   hgb  9.8 Platelets:  244   INR stable: currently 2.6 Home dose: warfarin 5mg  on Tues/Thurs/Sun and  2.5 mg ROW   Goal of Therapy:  INR 2-3 Monitor platelets by anticoagulation protocol: Yes   Plan:  Warfarin 5 mg x 1 dose Daily INR and CBC Monitor for signs and symptoms of bleeding.  Margot Ables, PharmD Clinical Pharmacist 11/16/2019 8:30 AM

## 2019-11-16 NOTE — Progress Notes (Signed)
Report given to [facility] at [facility] this morning. IV to [place] dc'd intact. Awaiting transport for patient dc. Assisted patient back to bed with other staff x2 with use of gait belt, and walker. Patient tolerated well. Daughter at bedside.

## 2019-11-16 NOTE — TOC Transition Note (Addendum)
Transition of Care [facility]) - CM/SW Discharge Note   Patient Details  Name: Loal Berwanger MRN: [id number] Date of Birth: April 20, 1936  [facility] of [facility]) CM/SW Contact:  Boneta Lucks, RN Phone Number: 11/16/2019, 11:00 AM   Clinical Narrative:   Patient medically ready for transport.  Confirmed with Merrilyn Puma at [facility], she provided number for report, RN calling report, CM called EMS to schedule, medical necessity form printed, and Katharine Look - daughter updated. Clinical faxed to facility. She is on her way to visit before he leaves.      Final next level of care: Skilled Nursing Facility Barriers to Discharge: Barriers Resolved   Patient Goals and CMS Choice Patient states their goals for this hospitalization and ongoing recovery are:: to go to rehab. CMS Medicare.gov Compare Post Acute Care list provided to:: Patient Represenative (must comment) Choice offered to / list presented to : Adult Children  Discharge Placement              Patient chooses bed at: [facility] Patient to be transferred to facility by: EMS Name of family member notified: Leota Jacobsen - daughter Patient and family notified of of transfer: 11/16/19  Discharge Plan and Services In-house Referral: Clinical Social Work   Post Acute Care Choice: [place]

## 2019-12-13 ENCOUNTER — Telehealth: Payer: Self-pay | Admitting: *Deleted

## 2019-12-13 NOTE — Telephone Encounter (Signed)
Called daughter to reschedule his missed appointment and was informed that he died on 30-Nov-2019 in SNF ([facility]). He did get COVID while there and it was listed as one of his causes of death. Verbalized how much her care from Dr. Benay Spice and staff meant to them.

## 2019-12-20 DEATH — deceased

## 2019-12-24 ENCOUNTER — Ambulatory Visit: Payer: Medicare Other | Admitting: Internal Medicine

## 2020-09-14 IMAGING — CT CT CHEST W/O CM
2 of 4 series · 14 of 36 positions shown, 17 images · non-contrast
Comparison: Chest radiograph 11/10/2019

CLINICAL DATA: Metastatic squamous cell carcinoma involving the
right axilla. Weakness, fall.

EXAM:
CT CHEST, ABDOMEN AND PELVIS WITHOUT CONTRAST
TECHNIQUE: Multidetector CT imaging of the chest, abdomen and pelvis was
performed following the standard protocol without IV contrast.

[Series 2: cap without · axial · non-contrast · 0.98mm/px · z∈[+732,+1282]mm · 11 of 134 slices shown, 14 images]
[im 12/134  mediastinal]
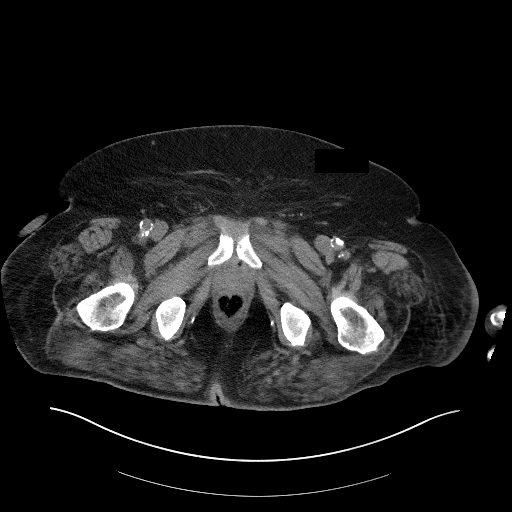
[im 12/134  lung]
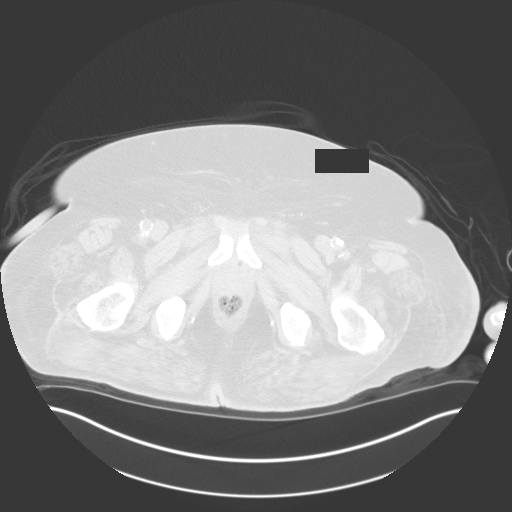
[im 23/134  lung]
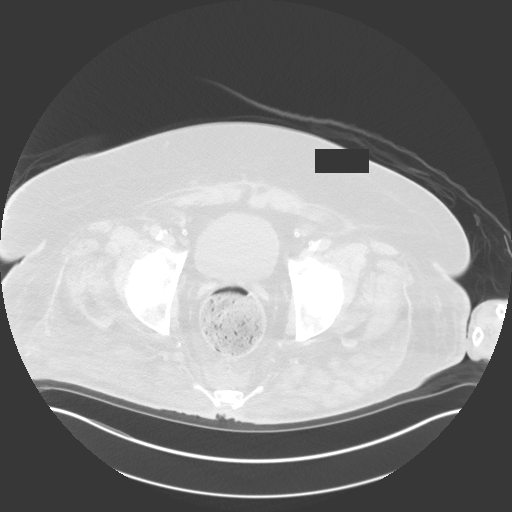
[im 34/134  lung]
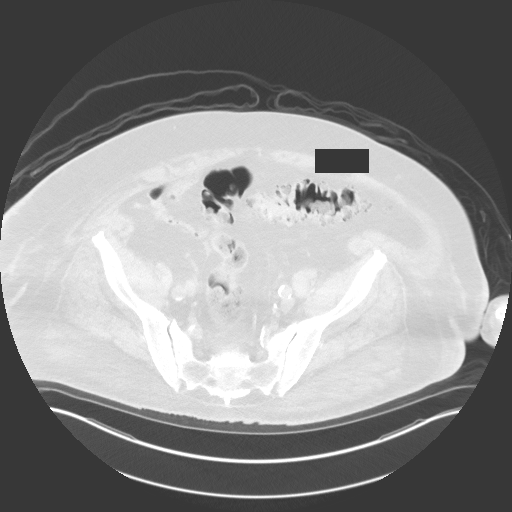
[im 45/134  lung]
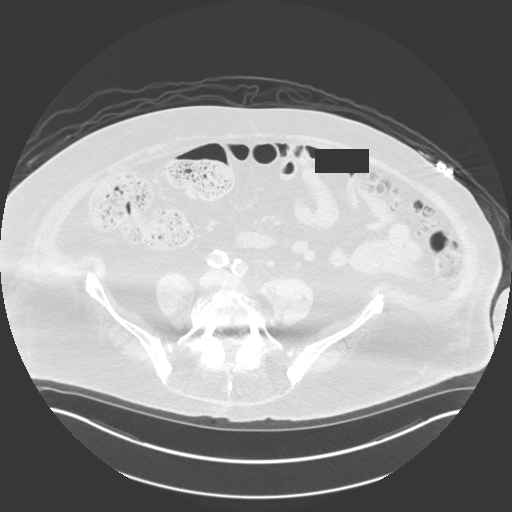
[im 56/134  mediastinal]
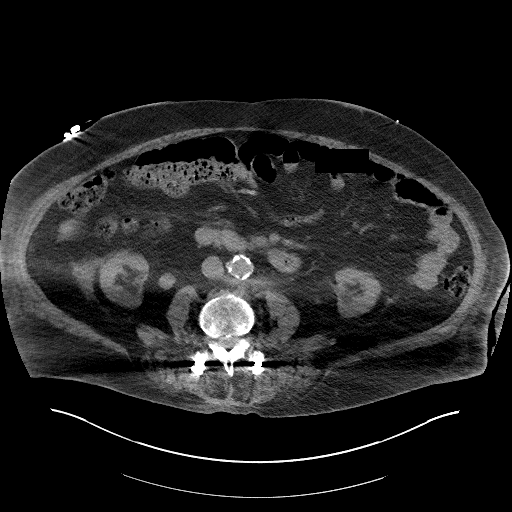
[im 56/134  lung]
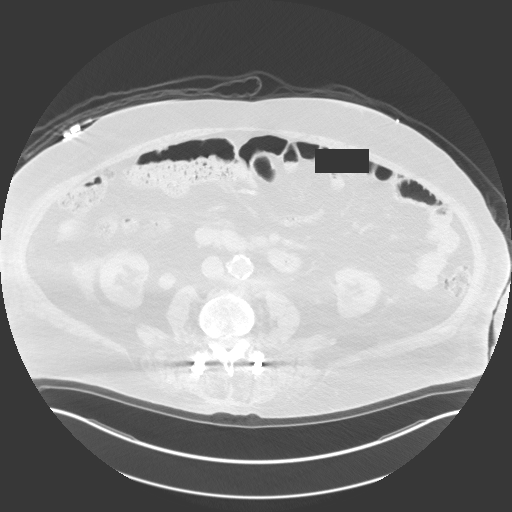
[im 67/134  lung]
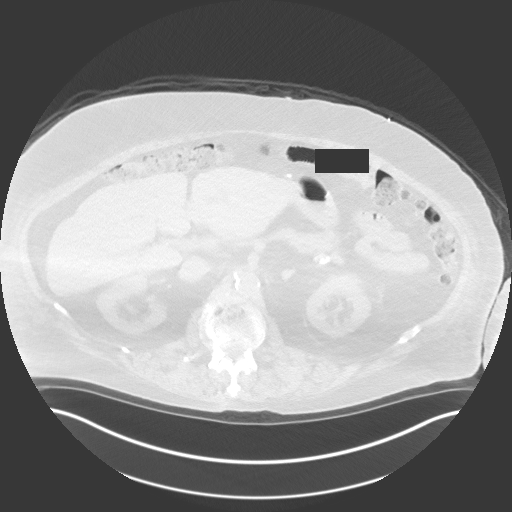
[im 78/134  lung]
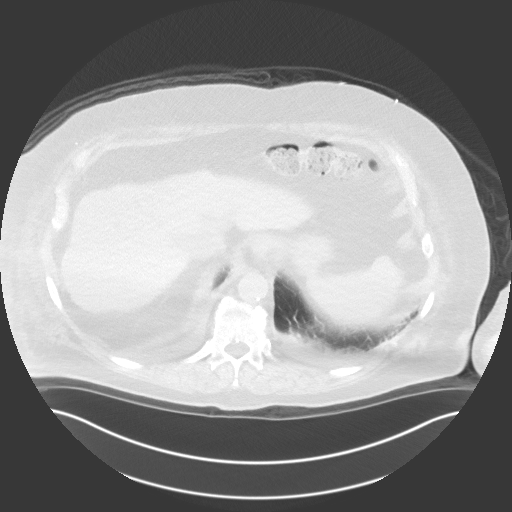
[im 89/134  lung]
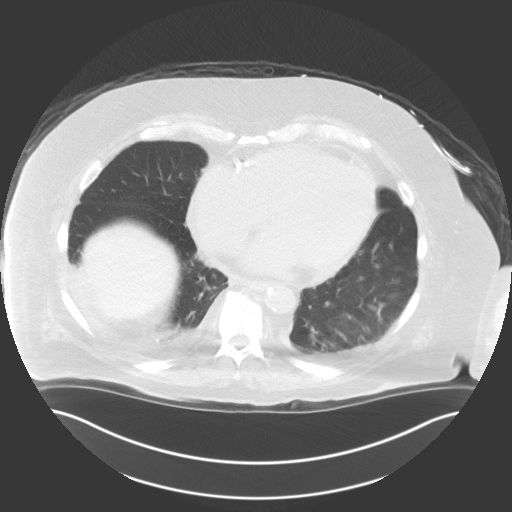
[im 100/134  mediastinal]
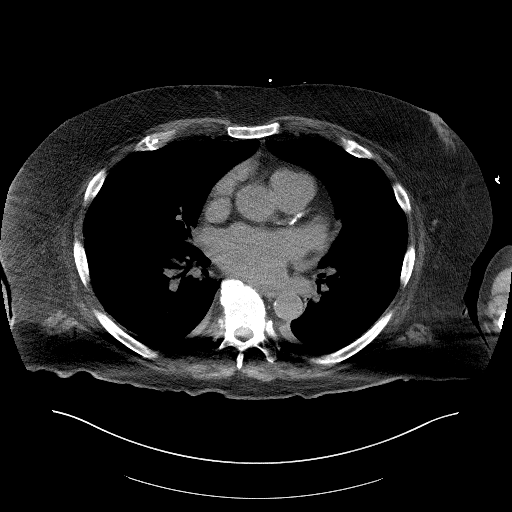
[im 100/134  lung]
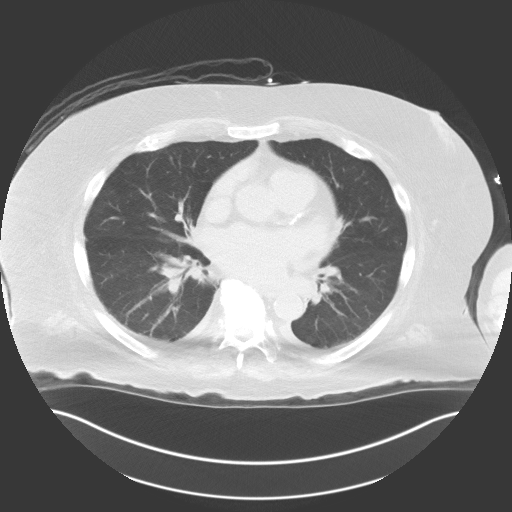
[im 111/134  lung]
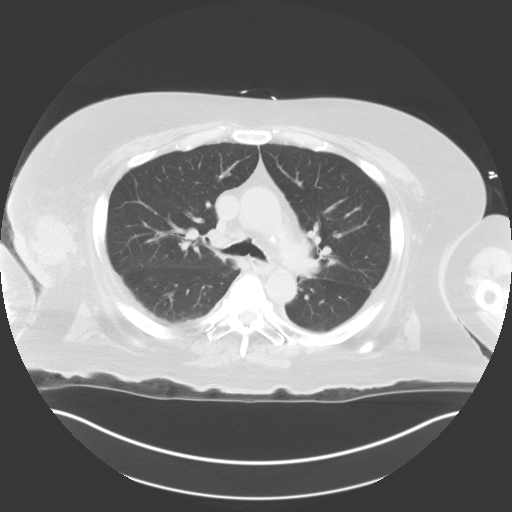
[im 122/134  lung]
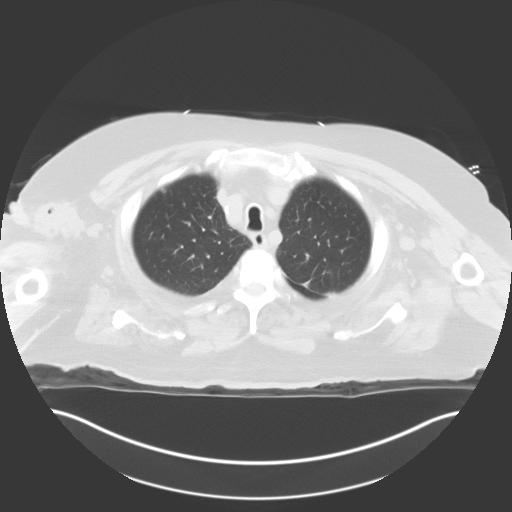

[Series 5: coronal · coronal · 0.98mm/px · 3 of 158 slices shown]
[im 32/158  lung]
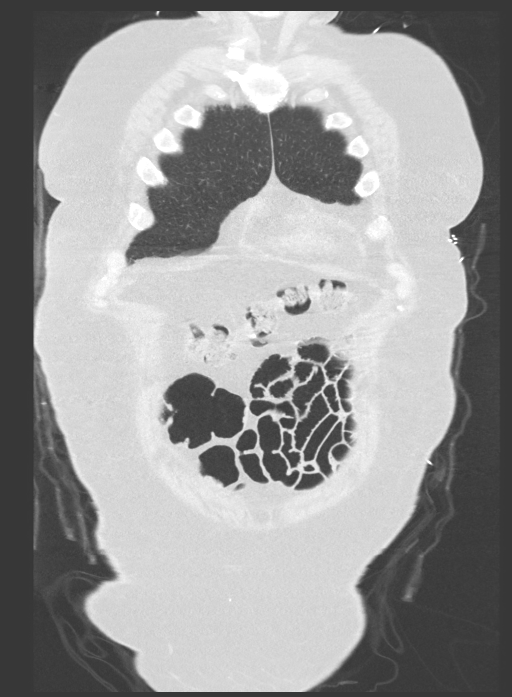
[im 63/158  lung]
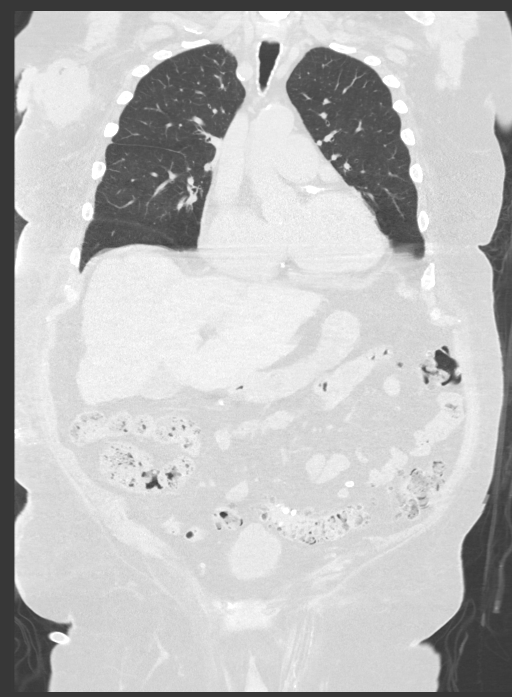
[im 95/158  lung]
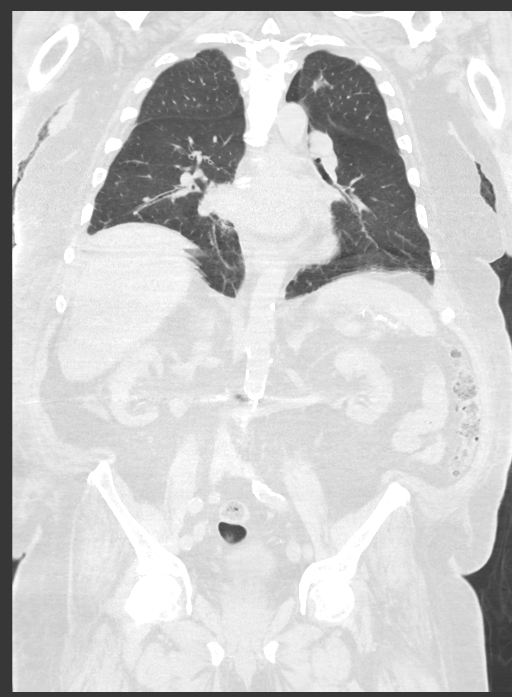

[14 of 36 positions shown; findings below may reference images not displayed]

FINDINGS: CT CHEST FINDINGS

Cardiovascular: Coronary, aortic arch, and branch vessel
atherosclerotic vascular disease. Mild cardiomegaly.

Mediastinum/Nodes: Right axillary mass extends to the cutaneous
surface and measures 8.8 by 5.2 cm, compatible with malignancy.

Lungs/Pleura: Trace bilateral pleural effusions. Bilateral airway
thickening. Bandlike in nodular density in the left upper lobe
extending along the major fissure, nodular portion measures 1.1 by
0.5 cm although a benign etiology is favored given the overall
configuration.

Musculoskeletal: Advanced degenerative left glenohumeral
arthropathy. Thoracic spondylosis.

CT ABDOMEN PELVIS FINDINGS

Hepatobiliary: Nodular liver favoring cirrhosis.

Pancreas: Unremarkable

Spleen: Unremarkable

Adrenals/Urinary Tract: 2.0 by 1.5 cm left adrenal nodule with
linear calcification along its margin, nonspecific density.

Stomach/Bowel: Descending and sigmoid colon diverticulosis.

Vascular/Lymphatic: Aortoiliac atherosclerotic vascular disease. No
pathologic adenopathy in the abdomen or pelvis.

Reproductive: Unremarkable

Other: Presacral edema/fluid, cause uncertain.

Musculoskeletal: Posterolateral rod and pedicle screw fixation at
L3-L5, no pedicle screws at the L4-5 level although there is
potentially inter facet fusion at L4-5. Grade 1 degenerative
retrolisthesis at L1-2 and L2-3 with loss of disc height throughout
the lumbar spine except at L5-S1. Spurring in the lumbar spine
causes bilateral foraminal impingement at all levels, most
especially at L4-5 and L5-S1 on the right. I do not see a sacral
fracture or specific sacral abnormality to correlate with the
presacral edema.
IMPRESSION: 1. 8.8 by 5.2 cm right axillary mass extending to the cutaneous
surface, compatible with malignancy.
2. No specific findings of metastatic disease to the abdomen or
pelvis.
3. Other imaging findings of potential clinical significance: Airway
thickening is present, suggesting bronchitis or reactive airways
disease. Trace bilateral pleural effusions. Mild cardiomegaly.
Nodular liver favoring cirrhosis. Descending and sigmoid colon
diverticulosis. Multilevel impingement in the lumbar spine due to
spurring. Advanced degenerative left glenohumeral arthropathy.
Nonspecific small left adrenal mass with some linear calcification
along its margin. Aortic Atherosclerosis ([id number]).

## 2020-09-14 IMAGING — CT CT ABD-PELV W/O CM
2 of 4 series · 14 of 36 positions shown, 17 images · non-contrast
Comparison: Chest radiograph 11/10/2019

CLINICAL DATA: Metastatic squamous cell carcinoma involving the
right axilla. Weakness, fall.

EXAM:
CT CHEST, ABDOMEN AND PELVIS WITHOUT CONTRAST
TECHNIQUE: Multidetector CT imaging of the chest, abdomen and pelvis was
performed following the standard protocol without IV contrast.

[Series 2: cap without · axial · non-contrast · 0.98mm/px · z∈[+732,+1282]mm · 11 of 134 slices shown, 14 images]
[im 12/134  mediastinal]
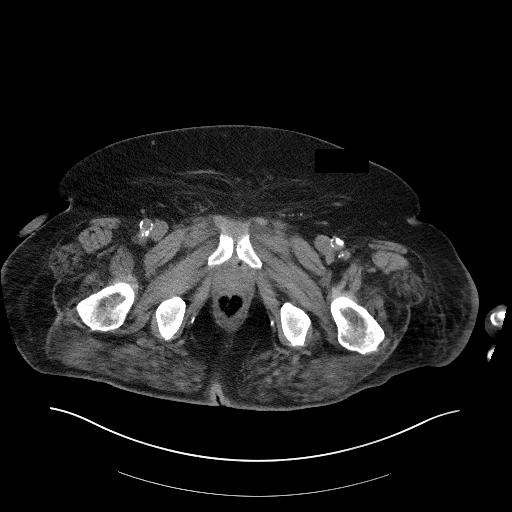
[im 12/134  lung]
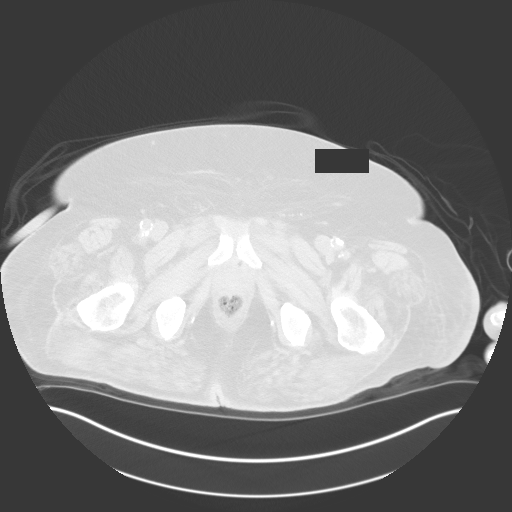
[im 23/134  lung]
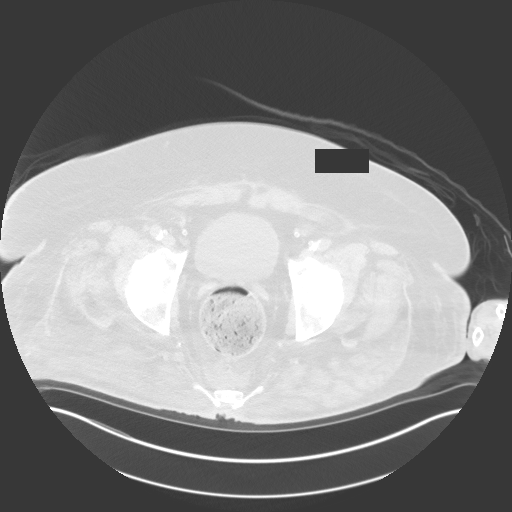
[im 34/134  lung]
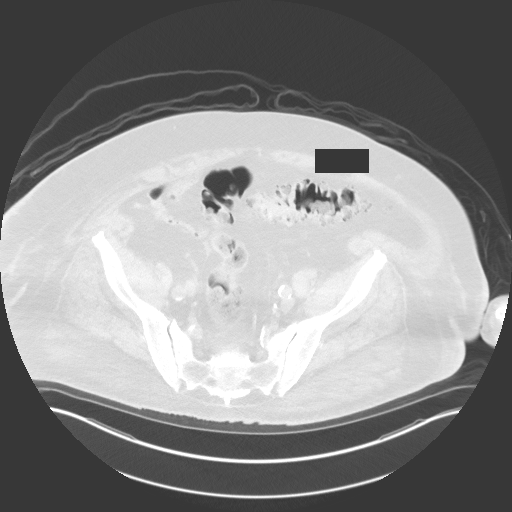
[im 45/134  lung]
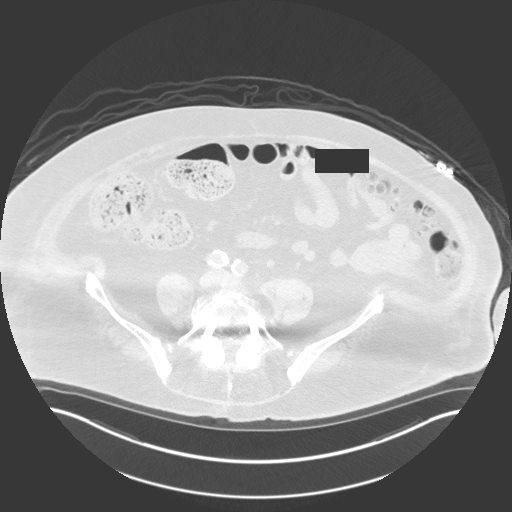
[im 56/134  mediastinal]
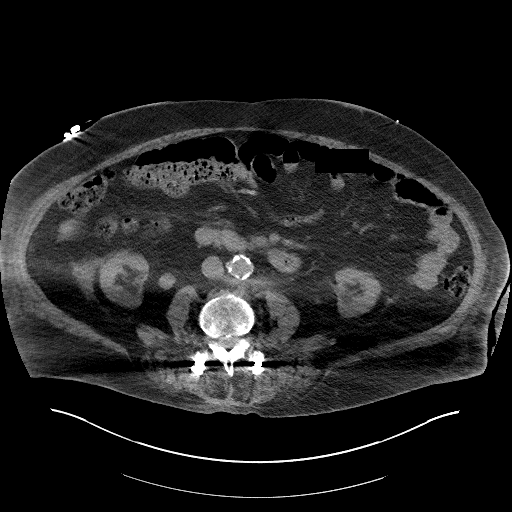
[im 56/134  lung]
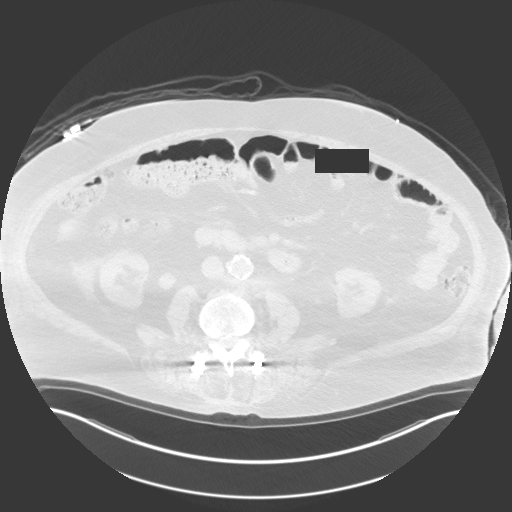
[im 67/134  lung]
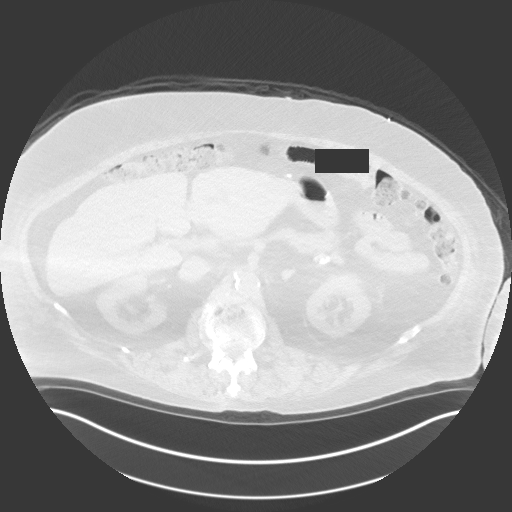
[im 78/134  lung]
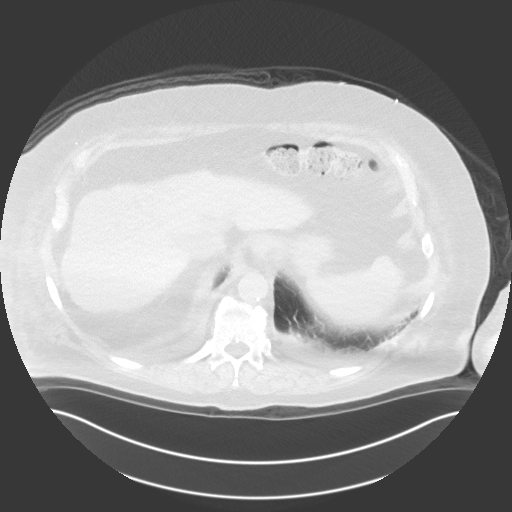
[im 89/134  lung]
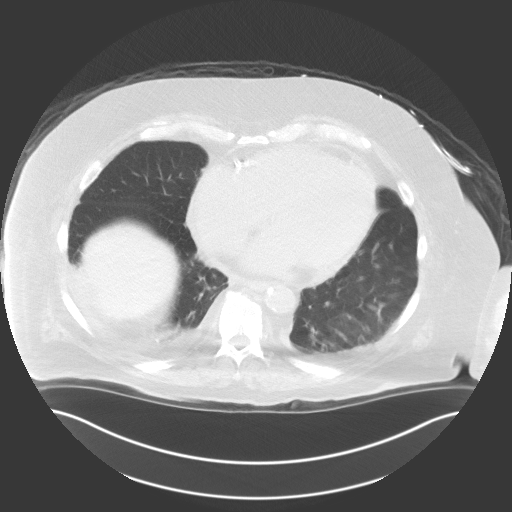
[im 100/134  mediastinal]
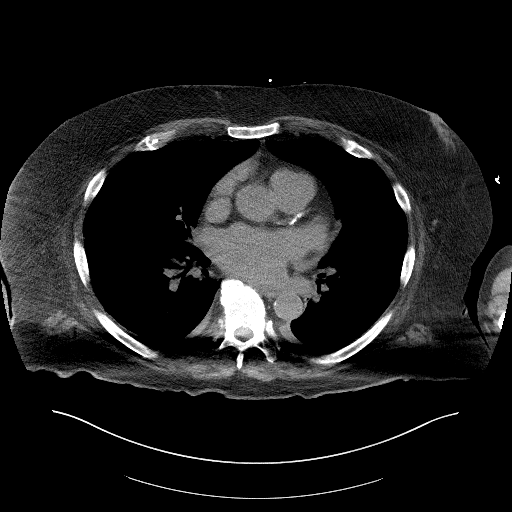
[im 100/134  lung]
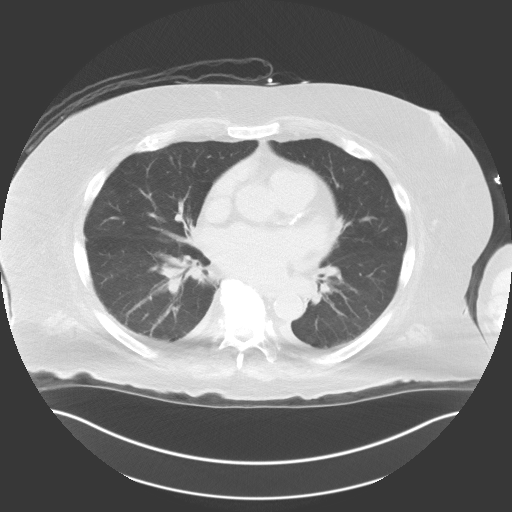
[im 111/134  lung]
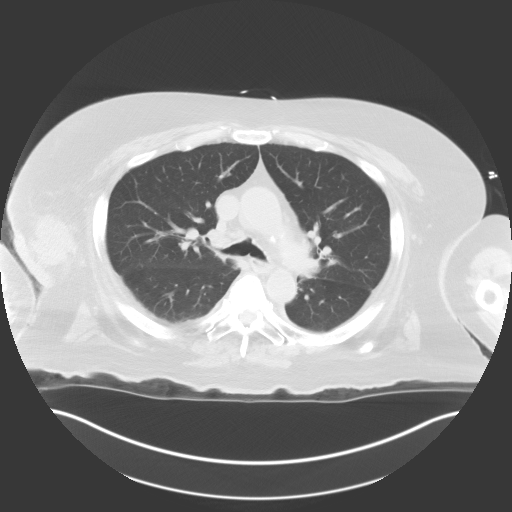
[im 122/134  lung]
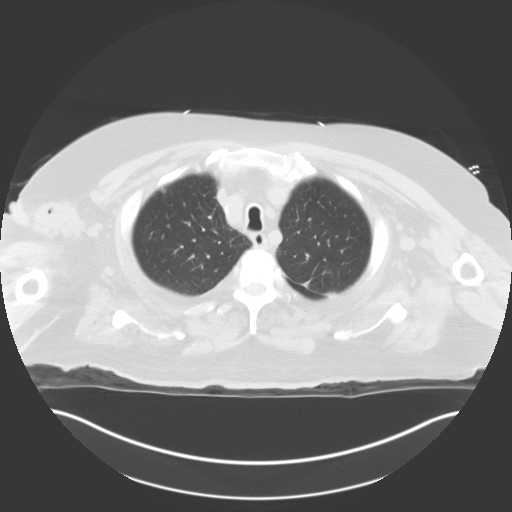

[Series 5: coronal · coronal · 0.98mm/px · 3 of 158 slices shown]
[im 32/158  lung]
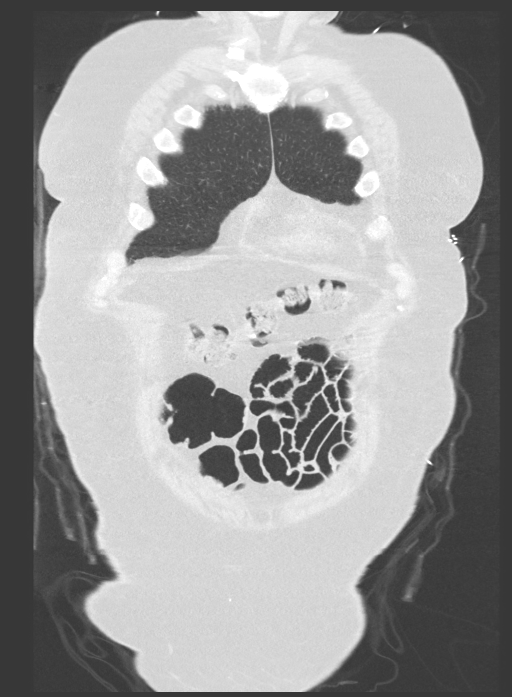
[im 63/158  lung]
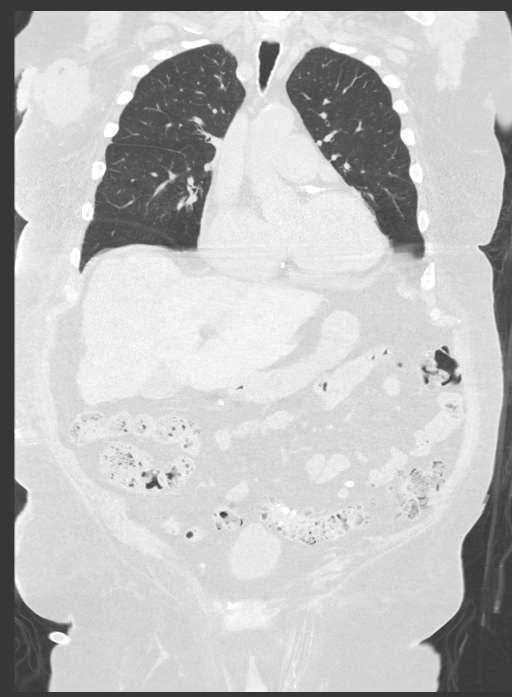
[im 95/158  lung]
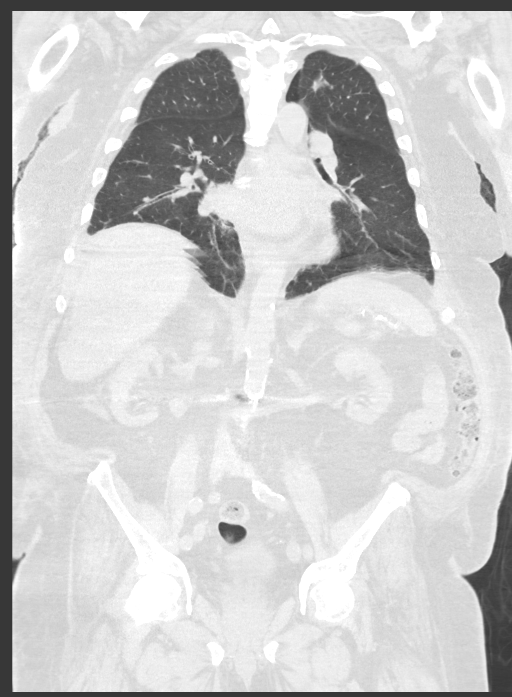

[14 of 36 positions shown; findings below may reference images not displayed]

FINDINGS: CT CHEST FINDINGS

Cardiovascular: Coronary, aortic arch, and branch vessel
atherosclerotic vascular disease. Mild cardiomegaly.

Mediastinum/Nodes: Right axillary mass extends to the cutaneous
surface and measures 8.8 by 5.2 cm, compatible with malignancy.

Lungs/Pleura: Trace bilateral pleural effusions. Bilateral airway
thickening. Bandlike in nodular density in the left upper lobe
extending along the major fissure, nodular portion measures 1.1 by
0.5 cm although a benign etiology is favored given the overall
configuration.

Musculoskeletal: Advanced degenerative left glenohumeral
arthropathy. Thoracic spondylosis.

CT ABDOMEN PELVIS FINDINGS

Hepatobiliary: Nodular liver favoring cirrhosis.

Pancreas: Unremarkable

Spleen: Unremarkable

Adrenals/Urinary Tract: 2.0 by 1.5 cm left adrenal nodule with
linear calcification along its margin, nonspecific density.

Stomach/Bowel: Descending and sigmoid colon diverticulosis.

Vascular/Lymphatic: Aortoiliac atherosclerotic vascular disease. No
pathologic adenopathy in the abdomen or pelvis.

Reproductive: Unremarkable

Other: Presacral edema/fluid, cause uncertain.

Musculoskeletal: Posterolateral rod and pedicle screw fixation at
L3-L5, no pedicle screws at the L4-5 level although there is
potentially inter facet fusion at L4-5. Grade 1 degenerative
retrolisthesis at L1-2 and L2-3 with loss of disc height throughout
the lumbar spine except at L5-S1. Spurring in the lumbar spine
causes bilateral foraminal impingement at all levels, most
especially at L4-5 and L5-S1 on the right. I do not see a sacral
fracture or specific sacral abnormality to correlate with the
presacral edema.
IMPRESSION: 1. 8.8 by 5.2 cm right axillary mass extending to the cutaneous
surface, compatible with malignancy.
2. No specific findings of metastatic disease to the abdomen or
pelvis.
3. Other imaging findings of potential clinical significance: Airway
thickening is present, suggesting bronchitis or reactive airways
disease. Trace bilateral pleural effusions. Mild cardiomegaly.
Nodular liver favoring cirrhosis. Descending and sigmoid colon
diverticulosis. Multilevel impingement in the lumbar spine due to
spurring. Advanced degenerative left glenohumeral arthropathy.
Nonspecific small left adrenal mass with some linear calcification
along its margin. Aortic Atherosclerosis ([id number]).
